# Patient Record
Sex: Male | Born: 1945 | Race: White | Hispanic: No | Marital: Married | Smoking: Former smoker
Health system: Southern US, Community
[De-identification: ages and names within clinical notes are randomized; demographics above are authoritative.]

## PROBLEM LIST (undated history)

## (undated) DIAGNOSIS — N138 Other obstructive and reflux uropathy: Secondary | ICD-10-CM

## (undated) DIAGNOSIS — R311 Benign essential microscopic hematuria: Secondary | ICD-10-CM

## (undated) DIAGNOSIS — M1990 Unspecified osteoarthritis, unspecified site: Secondary | ICD-10-CM

## (undated) DIAGNOSIS — J449 Chronic obstructive pulmonary disease, unspecified: Secondary | ICD-10-CM

## (undated) DIAGNOSIS — J4489 Other specified chronic obstructive pulmonary disease: Secondary | ICD-10-CM

## (undated) DIAGNOSIS — E663 Overweight: Secondary | ICD-10-CM

## (undated) DIAGNOSIS — R6883 Chills (without fever): Secondary | ICD-10-CM

## (undated) DIAGNOSIS — I10 Essential (primary) hypertension: Secondary | ICD-10-CM

## (undated) DIAGNOSIS — K219 Gastro-esophageal reflux disease without esophagitis: Secondary | ICD-10-CM

## (undated) DIAGNOSIS — N401 Enlarged prostate with lower urinary tract symptoms: Secondary | ICD-10-CM

## (undated) HISTORY — PX: OTHER SURGICAL HISTORY: SHX169

## (undated) HISTORY — DX: Unspecified osteoarthritis, unspecified site: M19.90

## (undated) HISTORY — DX: Essential (primary) hypertension: I10

## (undated) HISTORY — PX: RHIZOTOMY: SHX2355

## (undated) HISTORY — DX: Benign prostatic hyperplasia with lower urinary tract symptoms: N40.1

## (undated) HISTORY — DX: Overweight: E66.3

## (undated) HISTORY — DX: Benign prostatic hyperplasia with lower urinary tract symptoms: N13.8

## (undated) HISTORY — PX: HIATAL HERNIA REPAIR: SHX195

## (undated) HISTORY — DX: Chronic obstructive pulmonary disease, unspecified: J44.9

## (undated) HISTORY — DX: Chills (without fever): R68.83

## (undated) HISTORY — DX: Gastro-esophageal reflux disease without esophagitis: K21.9

## (undated) HISTORY — PX: CHOLECYSTECTOMY: SHX55

## (undated) HISTORY — DX: Benign essential microscopic hematuria: R31.1

## (undated) HISTORY — PX: URETHRAL STRICTURE DILATATION: SHX477

## (undated) HISTORY — DX: Other specified chronic obstructive pulmonary disease: J44.89

---

## 2006-09-23 ENCOUNTER — Other Ambulatory Visit: Payer: Self-pay

## 2006-09-23 ENCOUNTER — Emergency Department: Payer: Self-pay | Admitting: Emergency Medicine

## 2006-09-29 ENCOUNTER — Ambulatory Visit: Payer: Self-pay | Admitting: Unknown Physician Specialty

## 2007-01-10 ENCOUNTER — Inpatient Hospital Stay (HOSPITAL_COMMUNITY): Admission: EM | Admit: 2007-01-10 | Discharge: 2007-01-14 | Payer: Self-pay | Admitting: Emergency Medicine

## 2007-02-23 ENCOUNTER — Ambulatory Visit: Admission: RE | Admit: 2007-02-23 | Discharge: 2007-02-23 | Payer: Self-pay | Admitting: Neurosurgery

## 2010-11-27 ENCOUNTER — Other Ambulatory Visit (HOSPITAL_COMMUNITY): Payer: Self-pay

## 2010-12-03 ENCOUNTER — Inpatient Hospital Stay (HOSPITAL_COMMUNITY): Payer: 59

## 2010-12-03 ENCOUNTER — Inpatient Hospital Stay (HOSPITAL_COMMUNITY)
Admission: RE | Admit: 2010-12-03 | Discharge: 2010-12-06 | DRG: 026 | Disposition: A | Discharge status: Home / Self Care | Payer: 59 | Source: Ambulatory Visit | Attending: Neurosurgery | Admitting: Neurosurgery

## 2010-12-03 DIAGNOSIS — G500 Trigeminal neuralgia: Principal | ICD-10-CM | POA: Diagnosis present

## 2010-12-03 DIAGNOSIS — J4489 Other specified chronic obstructive pulmonary disease: Secondary | ICD-10-CM | POA: Diagnosis present

## 2010-12-03 DIAGNOSIS — N390 Urinary tract infection, site not specified: Secondary | ICD-10-CM | POA: Diagnosis present

## 2010-12-03 DIAGNOSIS — J449 Chronic obstructive pulmonary disease, unspecified: Secondary | ICD-10-CM | POA: Diagnosis present

## 2010-12-03 DIAGNOSIS — I10 Essential (primary) hypertension: Secondary | ICD-10-CM | POA: Diagnosis present

## 2010-12-03 LAB — BASIC METABOLIC PANEL
BUN: 15 mg/dL (ref 6–23)
CO2: 29 mEq/L (ref 19–32)
Calcium: 9.4 mg/dL (ref 8.4–10.5)
Chloride: 104 mEq/L (ref 96–112)
Creatinine, Ser: 0.99 mg/dL (ref 0.4–1.5)
GFR calc Af Amer: >60 mL/min (ref >60)
GFR calc non Af Amer: >60 mL/min (ref >60)
Glucose, Bld: 100 mg/dL — ABNORMAL HIGH (ref 70–99)
Potassium: 5.1 mEq/L (ref 3.5–5.1)
Sodium: 138 mEq/L (ref 135–145)

## 2010-12-03 LAB — URINE MICROSCOPIC-ADD ON

## 2010-12-03 LAB — TYPE AND SCREEN
ABO/RH(D): O POS
Antibody Identification: REACTIVE
Antibody Screen: POSITIVE
DAT, IgG: NEGATIVE

## 2010-12-03 LAB — URINALYSIS, ROUTINE W REFLEX MICROSCOPIC
Bilirubin Urine: NEGATIVE
Glucose, UA: NEGATIVE mg/dL
Ketones, ur: NEGATIVE mg/dL
Nitrite: NEGATIVE
Protein, ur: NEGATIVE mg/dL
Specific Gravity, Urine: 1.015 (ref 1.005–1.030)
Urobilinogen, UA: 0.2 mg/dL (ref 0.0–1.0)
pH: >9 — ABNORMAL HIGH (ref 5.0–8.0)

## 2010-12-03 LAB — CBC
HCT: 42 % (ref 39.0–52.0)
Hemoglobin: 14.6 g/dL (ref 13.0–17.0)
MCH: 29.6 pg (ref 26.0–34.0)
MCHC: 34.8 g/dL (ref 30.0–36.0)
MCV: 85 fL (ref 78.0–100.0)
Platelets: 239 10*3/uL (ref 150–400)
RBC: 4.94 MIL/uL (ref 4.22–5.81)
RDW: 14 % (ref 11.5–15.5)
WBC: 6.1 10*3/uL (ref 4.0–10.5)

## 2010-12-03 LAB — SURGICAL PCR SCREEN
MRSA, PCR: NEGATIVE
Staphylococcus aureus: NEGATIVE

## 2010-12-04 LAB — URINE CULTURE
Colony Count: NO GROWTH
Culture  Setup Time: 201204241440
Culture: NO GROWTH
Special Requests: POSITIVE

## 2010-12-04 LAB — GLUCOSE, CAPILLARY: Glucose-Capillary: 103 mg/dL — ABNORMAL HIGH (ref 70–99)

## 2010-12-04 NOTE — Consult Note (Signed)
  NAME:  Keith Craig, Keith Craig NO.:  [id number]  MEDICAL RECORD NO.:  [id number]           PATIENT TYPE:  I  LOCATION:  3109                         FACILITY:  [facility]  PHYSICIAN:  Bertram Millard. Cabell Lazenby, M.D.DATE OF BIRTH:  18-Oct-1945  DATE OF CONSULTATION:  12/03/2010 DATE OF DISCHARGE:                                CONSULTATION   REASON FOR CONSULTATION:  Difficult catheterization.  REQUESTING PHYSICIAN:  Danae Orleans. Venetia Maxon, MD.  BRIEF HISTORY:  A 65 year old male, who has undergone a craniotomy for treatment of trigeminal neuralgia.  The patient was anesthetized for the procedure, and before the procedure began, attempted bladder catheterization was performed.  First, a 16-French Foley was used, then a 16-French coude.  Catheterization was unsuccessful, and at the time of consultation, the nurses noticed some blood at the meatus.  Urologic consultation is requested.  There is apparently a history of a large prostate, but no prior urologic consultation.  There is no history of urologic surgery.  Past medical history, review of systems is unobtainable at this point, as the patient is anesthetized.  Exam revealed the patient to have a flat abdomen without any suprapubic mass.  Phallus was circumcised.  There was some blood at the meatus. Scrotal skin, testicles, cords, and epididymal structures were normal. There was normal anal sphincter tone.  Gland was 2+ without nodularity or tenderness.  There are no rectal masses.  I attempted an 18-French coude tip catheter placement after sterile prep and drape.  This was unsuccessful.  I then performed flexible cystoscopy.  Unfortunately, there was an obliterated proximal urethra. I was unable to find any normal lumen of the urethra.  I saw no evidence of stricture disease more distally.  There was some bleeding, but not heavy.  Cystoscopy was stopped at this point.  The bladder was nonpalpable. Currently, I feel that  worthwhile to not place a suprapubic tube, as the patient did not have a palpable bladder.  As he had no severe voiding problems beforehand (I talked with the patient's wife), I will leave the bladder undrained.  We will give him a voiding trial postoperatively.  If there is difficulty voiding, we will have to place a suprapubic tube temporarily.  If there is adequate voiding, I would need to see the patient in follow- up in approximately 2-3 weeks, after he is adequately healed up from his craniotomy.  The patient was covered with Ancef for both his craniotomy and his urethral instrumentation.     Bertram Millard. Retta Diones, M.D.     SMD/MEDQ  D:  12/03/2010  T:  12/03/2010  Job:  [id number]  Electronically Signed by Marcine Matar M.D. on 12/04/2010 05:58:41 AM

## 2010-12-04 NOTE — Op Note (Signed)
NAME:  Keith Craig, Keith Craig NO.:  [id number]  MEDICAL RECORD NO.:  [id number]           PATIENT TYPE:  I  LOCATION:  2899                         FACILITY:  [facility]  PHYSICIAN:  Danae Orleans. Venetia Maxon, M.D.  DATE OF BIRTH:  27-Jan-1946  DATE OF PROCEDURE:  12/03/2010 DATE OF DISCHARGE:                              OPERATIVE REPORT   PREOPERATIVE DIAGNOSIS:  Left V1 and V2 trigeminal neuralgia.  POSTOPERATIVE DIAGNOSIS:  Left V1 and V2 trigeminal neuralgia.  PROCEDURE:  Left trigeminal nerve microvascular decompression with cranioplasty, microdissection.  SURGEON:  Danae Orleans. Venetia Maxon, MD  ASSISTANTS:  Hewitt Shorts, MD and Georgiann Cocker, RN  ANESTHESIA:  General endotracheal anesthesia.  ESTIMATED BLOOD LOSS:  Minimal.  COMPLICATIONS:  None.  DISPOSITION:  Recovery.  INDICATIONS:  Keith Craig is a 65 year old man with intractable left facial pain in the V1 and V2 distribution refractory to medications.  It was elected to take him to surgery for left microvascular decompression. He had already undergone a radiofrequency ablation with temporary relief but then with recurrence of facial pain.  PROCEDURE:  [name] was brought to the operating room.  Following satisfactory and uncomplicated induction of general endotracheal anesthesia plus intravenous lines, effort was made to place a Foley catheter.  Initially, we used a 16-French regular catheter, then Coude tip catheter, then we consulted Dr. Patsi Sears to place a Foley catheter, he ended up doing a cystoscopy and was not able to see a lumen felt that the patient had a stricture and recommended not to place a Foley catheter.  We then placed the patient in three-pin head fixation turned him in a right lateral decubitus position with axillary roll and padding soft tissue and bony prominences appropriately.  His left postauricular scalp was shaved, then prepped and draped in the usual sterile fashion.   Area of planned incision was infiltrated with local lidocaine.  Incision was then made and carried to the calvarium using cerebellar retractor.  The posterior fossa bone was exposed.  A high- speed drill was used to produce a suboccipital craniectomy.  Emissary veins were waxed and mastoid air cells were also waxed.  The dura was exposed and then the remaining bone was removed with a Kerrison rongeur. The transverse sinus was identified as was the sigmoid sinus and the junction of the sinuses were exposed.  The dura was incised.  There was significant removal of CSF which led to relaxation of the posterior fossa and an Apfelbaum retractor was used with a quarter-inch retractor blade.  The superior petrosal sinus was visualized.  A microscope was then used for the remainder of the surgery.  The superior petrosal vein was then cauterized with bipolar electrocautery and cut.  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  There were several vascular structures which were indenting the trigeminal nerve including an artery which was along the undersurface of the nerve with a branch and the branch point was directly compressing the nerve with the knuckle of artery that was directly into the underside of the nerve.  Two Teflon pledgets were then  fashioned, 1 pledget was placed underlying the nerve overlying the artery along the brainstem and then a second pledget was placed more distally along the nerve where the nerve entered Meckel cave, there was an additional arterial branch which appeared to be indenting the undersurface of the nerve.  At this point, the nerve was felt to be well- decompressed.  Hemostasis was assured.  The wounds were irrigated.  The retractor was removed.  The dura was then reapproximated loosely with Nurolon stitches and a piece of DuraGen was placed overlying the dural defect.  A cranioplasty was then performed with  methylmethacrylate to fill the suboccipital cranial defect.  The muscular layer was then reapproximated with 0 Vicryl sutures.  The fascia was reapproximated with 0 Vicryl sutures.  Subcutaneous tissues were reapproximated with 2- 0 Vicryl interrupted inverted sutures and the skin edges were approximated with running 3-0 nylon lock stitch.  The wound was dressed with bacitracin, Telfa gauze, and OpSite.  The patient was then extubated in the operating room.  He was taken out of three-pin head fixation.  He was turned to the supine position and taken to recovery and tolerated the procedure well.  Counts were correct at the end of the case.     Danae Orleans. Venetia Maxon, M.D.     JDS/MEDQ  D:  12/03/2010  T:  12/03/2010  Job:  [id number]  Electronically Signed by Maeola Harman M.D. on 12/04/2010 09:22:01 AM

## 2010-12-24 NOTE — Discharge Summary (Signed)
NAME:  Keith Craig, Keith Craig NO.:  [id number]   MEDICAL RECORD NO.:  [id number]          PATIENT TYPE:  INP   LOCATION:  3005                         FACILITY:  [facility]   PHYSICIAN:  Marlan Palau, M.D.  DATE OF BIRTH:  09/02/1945   DATE OF ADMISSION:  01/10/2007  DATE OF DISCHARGE:  01/14/2007                               DISCHARGE SUMMARY   ADMISSION DIAGNOSES:  1. Trigeminal neuralgia with severe incapacitating pain.  2. Hypertension.  3. History of asthma with chronic obstructive pulmonary disease.   DISCHARGE DIAGNOSES:  1. Incapacitating left trigeminal neuralgia pain.  2. Hypertension.  3. Asthma.  4. Gastroesophageal reflux disease.   PROCEDURES DURING THIS ADMISSION:  1. MRI of the brain.  2. MRI angiogram of the intracranial and extracranial vessels.  3. Percutaneous rhizotomy of the left V2 area.   HISTORY OF PRESENT ILLNESS:  Keith Craig is a 65 year old right-  handed white male born March 17, 1946 with history of left-sided  trigeminal neuralgia affecting the V1 and V2 areas.  The patient has had  a longstanding history of pain dating back to 1999.  The patient has  been seen by Dr. Vonita Moss in [facility] Dr. Donette Larry at [facility].  The patient has been followed for several years by Dr. Trey Sailors and has been treated with carbamazepine and Neurontin.  Over the  last 2 weeks, the patient has had significant exacerbation of pain.  The  patient has been unable to chew, swallow or eat.  The patient has not  taken any p.o. food and fluids since Jan 09, 2007.  He comes into the  emergency room on January 10, 2007 for pain management.  The patient is  unable to speak.  Wife was concerned about a stroke, but upon further  evaluation, it was obvious that the patient's inability speak was  related to severe pain.  The patient was admitted for evaluation and  management of pain.   PAST MEDICAL HISTORY:  1. History of trigeminal neuralgia on  the left.  2. Obesity.  3. Hypertension.  4. Gastroesophageal reflux disease.  5. Asthma with COPD.   MEDICATIONS:  1. Carbamazepine 200 mg three times day.  2. Neurontin 300 mg three times a day.  3. Clonidine 0.2 mg b.i.d.  4. Diovan 160 mg daily.  5. Protonix 40 mg b.i.d.  6. Percocet if needed.  7. Serevent inhaler 50 mcg if needed.  8. Flovent inhaler b.i.d.  9. Albuterol inhaler p.r.n.   ALLERGIES:  The patient has no known allergies.   Does not smoke or drink.   SOCIAL HISTORY/FAMILY HISTORY/REVIEW OF SYSTEMS/PHYSICAL EXAMINATION:  Please refer to history and physical dictation.   LABORATORY VALUES:  Sodium of 134, potassium 4.4, chloride of 101.  CO2  of 26, glucose of 151, BUN of 25, creatinine of 0.91, calcium of 8.7.  Dilantin of 4.1.  White count of 5.1, hemoglobin 15.4, hematocrit 45.0,  MCV of 87.0, platelets of 224, INR 0.9.  Tegretol level on admission was  5.3, creatinine 1.0.   Electrocardiogram revealed normal sinus rhythm, normal EKG,  heart rate  of 66.  MRI scan of the brain was unremarkable.  Chronic sinusitis was  noted.  On MRI angiogram, intracranial and extracranial vessels were  normal.  CT of the head was unremarkable.  Chest x-ray shows no acute  findings.  A moderate size hiatal hernia noted.   HOSPITAL COURSE:  This patient was admitted to [facility] with  severe trigeminal neuralgia pain.  This patient has not been able to eat  for at least 24 hours prior to admission.  The patient was placed on IV  Dilantin, as she was unable to take oral medications, and was put on  Solu-Medrol 250 mg IV q.6h.  The patient had not improved much in the  first day and a half of the hospitalization on this regimen.  The  patient was seen by Dr. Trey Sailors, and plans for a V2 rhizotomy procedure  was planned.  This was done early in the morning on January 13, 2007.  The  patient immediately gained significant benefit with pain improvement in  the V2  distribution and restarted regaining ability to talk and eat and  swallow.  The patient has been able to take in p.o. food and fluids over  the last 24 hours, and plans are to discharge this patient to home today  with follow-up with Dr. Channing Mutters.   DISCHARGE MEDICATIONS:  1. Clonidine 0.2 mg twice daily.  2. Diovan 160 mg daily.  3. Neurontin 600 three times daily.  4. Carbamazepine 200 mg three a day.  5. Protonix 40 mg daily.  6. Flovent inhaler 2 puffs twice daily.  7. Albuterol inhaler if needed.  8. Serevent discus 50 mcg 1 puff twice daily if needed.  9. The patient has oxycodone at home that he can take if needed.  10.Fentanyl patch was used during the hospitalization with a 50 mcg      patch.  This will be discontinued upon      discharge.  11.The patient will be on prednisone taper 10 mg 12-day pack.   FLUIDS REPLACED:  Again, will follow up with Dr. Trey Sailors.      Marlan Palau, M.D.  Electronically Signed     CKW/MEDQ  D:  01/14/2007  T:  01/14/2007  Job:  [id number]   cc:   Payton Doughty, M.D.  [facility]

## 2010-12-24 NOTE — Op Note (Signed)
NAME:  SOLLIE, VULTAGGIO NO.:  [id number]   MEDICAL RECORD NO.:  [id number]          PATIENT TYPE:  INP   LOCATION:  3005                         FACILITY:  [facility]   PHYSICIAN:  Payton Doughty, M.D.      DATE OF BIRTH:  18-Apr-1946   DATE OF PROCEDURE:  01/12/2007  DATE OF DISCHARGE:                               OPERATIVE REPORT   PREOPERATIVE DIAGNOSIS:  Left V1 and V2 trigeminal neuralgia.   POSTOPERATIVE DIAGNOSIS:  Left V1 and V2 trigeminal neuralgia.   OPERATIVE PROCEDURE:  Left V2 percutaneous rhizotomy.   SURGEON:  Payton Doughty, M.D.   ANESTHESIA:  Local with sedation.   COMPLICATIONS:  None.   This is a 65 year old gentleman with left trigeminal neuralgia in part  V1 and part V2, taken to the operating room, placed on the operating  table.  Fluoro was positioned to demonstrate the foramen ovale on the  left side.  After 1% lidocaine injection into the cheek, disposable  needle was advanced into the foramen ovale in its medial aspect.  It was  advanced up about 5 mm past the clivus and during stimulation, we  obtained stimulation below his left eye but not above it.  It is felt  that this was the furthest the needle could be advanced safely without  creating corneal anesthesia.  The 60-degree 62nd lesion was then created  and the needle withdrawn without difficulty.  The patient returned to  the recovery room in good condition.           ______________________________  Payton Doughty, M.D.     MWR/MEDQ  D:  01/13/2007  T:  01/13/2007  Job:  [id number]

## 2010-12-24 NOTE — Discharge Summary (Signed)
  NAME:  Keith Craig, Keith Craig NO.:  [id number]  MEDICAL RECORD NO.:  [id number]           PATIENT TYPE:  I  LOCATION:  3019                         FACILITY:  [facility]  PHYSICIAN:  Danae Orleans. Venetia Maxon, M.D.  DATE OF BIRTH:  04-14-1946  DATE OF ADMISSION:  12/03/2010 DATE OF DISCHARGE:  12/06/2010                              DISCHARGE SUMMARY   REASON FOR ADMISSION:  Trigeminal neuralgia.  ADDITIONAL DIAGNOSES:  Urinary retention and urinary stricture.  HISTORY OF ILLNESS AND HOSPITAL COURSE:  Rodrigus Kilker is a 65 year old man with intractable left V1 and V2 distribution trigeminal neuralgia refractory to medications including carbamazepine and Neurontin.  He was admitted and underwent same day's procedure basis a left trigeminal nerve microvascular decompression.  The surgery was the only surgically issue related to placement of Foley catheter which was impossible to do. We called Dr. Marcine Matar who tried to place a Foley catheter, could not and then did a cystoscopy and found that the patient had a significant urinary stricture and was elected not to place a catheter. The patient was subsequently able to urinate on his own.  He did well with the surgical decompression and postoperatively was gradually mobilized, was observed in the ICU with arterial line blood pressure monitoring without any other problems.  He was doing well.  On December 06, 2010, was discharged home in stable and satisfactory condition having tolerated the operation and hospitalization well.  The only additional surgical issue related to some skew deviation related to his left eye but his facial pain was entirely resolved.  Hearing and facial motor were intact.  He also had on December 04, 2010, an episode of hypotension and bradycardia after breath-holding following an Advair dose and he was observed for an additional day in the ICU and did well with that without any other blood pressure issues.   He was discharged home on December 06, 2010, having tolerated his operation and hospitalization well, was discharged home on preoperative medications of milk of magnesia, Tylenol, oxycodone, Advair Diskus, gabapentin 600 mg four times daily, carbamazepine 200 mg three times daily, Protonix 40 mg daily, clonidine 0.2 mg twice daily and Diovan 160 mg daily with hydrocodone for pain. He was instructed to follow up in the office 10 days postoperatively for suture removal.     Danae Orleans. Venetia Maxon, M.D.     JDS/MEDQ  D:  12/17/2010  T:  12/17/2010  Job:  [id number]  Electronically Signed by Maeola Harman M.D. on 12/24/2010 07:43:53 AM

## 2010-12-24 NOTE — H&P (Signed)
NAME:  Keith Craig NO.:  [id number]   MEDICAL RECORD NO.:  [id number]          PATIENT TYPE:  INP   LOCATION:  3005                         FACILITY:  [facility]   PHYSICIAN:  Marlan Palau, M.D.  DATE OF BIRTH:  1946-02-13   DATE OF ADMISSION:  01/10/2007  DATE OF DISCHARGE:                              HISTORY & PHYSICAL   HISTORY OF PRESENT ILLNESS:  Keith Craig is a 65 year old right-  handed white male born 1946/04/13 with a history of trigeminal  neuralgia on the left side followed by Dr. Channing Mutters for a number of years.  The patient claims he first began having problems in 1999.  The patient  had occasional twinges in the left face prior to that.  The patient  initially was seen by Dr. Roseanne Reno, a neurology from the [facility] area,  but eventually went to see Dr. Donette Larry.  Due to a change in  insurance, the patient has been followed by Dr. Channing Mutters for several years  for the same problem.  Patient has been treated with Tegretol and  carbamazepine and Neurontin but over the last 2 weeks has had a severe  exacerbation of his pain.  In the past, the patient claims that steroids  have helped his pain.  Patient has gotten to the point where he has been  unable to chew, swallow, has not eaten anything or had much to drink  since 5 p.m. on Jan 09, 2007.  The patient has been unable to speak  because of this severe pain.  The wife is concerned about a stroke  brought to the hospital for further evaluation.  MRI scan done through  the ER is completely normal by my reading.  MR angiogram of intracranial  and extracranial vessels were normal.  Neurology was called after Dr.  Temple Pacini partners declined evaluation.  The patient is being admitted for  evaluation and management of the trigeminal neuralgia.   PAST MEDICAL HISTORY:  1. Significant for one history of trigeminal neuralgia on the left.  2. Obesity.  3. Hypertension.  4. Gastroesophageal reflux disease.  5. Asthma with COPD.   CURRENT MEDICATIONS:  1. Include carbamazepine 200 mg t.i.d.  2. Neurontin 300 mg t.i.d.  3. Clonidine 0.2 mg b.i.d.  4. Diovan 160 mg daily.  5. Protonix 40 mg b.i.d.  6. Percocet if needed.  7. Serevent inhaler 50 mcg if needed.  8. Flovent inhaler b.i.d.  9. Albuterol inhaler p.r.n.   Patient has no known allergies.  Does not smoke or drink.   SOCIAL HISTORY:  The patient is married, lives in the [place] area, is currently unemployed and looking for a job.  Patient  has two daughters who are alive and well.   FAMILY MEDICAL HISTORY:  Noted that father died with rheumatoid  arthritis.  Mother died with stroke, hypertension.  Father's side of the  family has extensive history of stroke.  The patient has a brother with  hypertension.   REVIEW OF SYSTEMS:  Notable for no recent fevers or chills.  The patient  does note  a mild headache with the trigeminal neuralgia when it gets  severe.  Does have some slight neck pain.  Has some low back pain with  weakness in the legs related to the back pain.  Has no chest pains,  shortness of breath other than the bouts of asthma.  Denies any nausea,  vomiting.  Does have some recent problems with diarrhea.  The patient  notes no focal numbness, weakness on arms or legs.   PHYSICAL EXAMINATION:  VITALS:  Blood pressure 150/91, heart rate 72,  respiratory rate 14, temperature febrile.  General:  This patient is a minimally to moderately obese white male who  is alert and cooperative at the time of examination.  HEENT:  Examination of head is atraumatic.  Eyes:  Pupils equal, round  and react to light.  Disks are flat bilaterally.  NECK:  Supple.  No carotid bruits noted.  RESPIRATORY:  Examination is clear.  CARDIOVASCULAR:  Examination reveals a regular rate and rhythm.  No  obvious murmurs or rubs are noted.  ABDOMEN:  Reveals positive bowel sounds.  No organomegaly or tenderness  noted.   EXTREMITIES:  Reveal trace edema at the ankles.  NEUROLOGIC:  Examination of cranial nerves as above.  Facial symmetry is  present.  The patient has difficulty opening his mouth and speaking due  to severe pain.  Extraocular movements are full.  Visual fields are  full.  No aphasia noted.  Motor testing reveals 5/5 strength in all  fours.  Good symmetric motor tone is noted throughout.  The patient has  some discomfort with elevating the right leg, has some giveaway weakness  with this.  The patient has good pinprick to soft touch and vibratory  sensation throughout.  The patient has good finger-nose-finger and toe-  to-finger bilaterally.  Gait was not tested.  Deep tendon reflexes  symmetric and normal.  Toes are downgoing bilaterally.  No drift is seen  in the upper extremities.   MRI scan of the brain is as above.   LABORATORY VALUES:  Notable for white count of 5.1, hemoglobin of 16.3,  hematocrit 48, MCV of 7, platelets of 224.  INR 0.9, sodium 138,  potassium 4.5, chloride of 106, glucose of 88, BUN of 21 and creatinine  1.   IMPRESSION:  1. Severe right trigeminal neuralgia.  2. Hypertension.  3. Gastroesophageal reflux disease.   This patient has pain to the point where he is not able to eat and  drink.  Has not had any p.o. intake over the last 24 hours.  The patient  will be admitted for pain control for IV fluid hydration.   PLAN:  1. Admission to [facility].  2. IV Solu-Medrol.  3. Morphine for pain.  4. Check carbamazepine levels.  5. Increase Neurontin to 600 mg t.i.d.  6. Clear liquid diet.  We will plan discharge once the patient's pain      level is down enough that he can eat or drink.      Marlan Palau, M.D.  Electronically Signed     CKW/MEDQ  D:  01/10/2007  T:  01/10/2007  Job:  [id number]   cc:   [facility]

## 2010-12-24 NOTE — Consult Note (Signed)
NAME:  Keith Craig, METALLO NO.:  [id number]   MEDICAL RECORD NO.:  [id number]          PATIENT TYPE:  INP   LOCATION:  3005                         FACILITY:  [facility]   PHYSICIAN:  Payton Doughty, M.D.      DATE OF BIRTH:  01/31/1946   DATE OF CONSULTATION:  01/11/2007  DATE OF DISCHARGE:                                 CONSULTATION   NEUROSURGERY CONSULT   PLACE OF CONSULT:  [address].   I was asked to see this 65 year old right-handed white gentleman who is  well known to me.  He has trigeminal neuralgia.  It is in the left side  in the V1, V2 distribution, set off by touching his tongue.  He notes  that several months ago he was treated with steroids for another problem  in the same __________ of the  face.  Dr. Anne Hahn has started him on  methylprednisolone in an effort to help this.  Mr. Mulford has been  unable to eat or drink for about 3 days.   On exam, his cranial nerves are intact save for V1, V2 tic.  It is  centered just about at the corner of his eye.  It gets down into his  malar eminence and up onto his forehead.  Remainder of his exam is  intact.   CLINICAL IMPRESSION:  Trigeminal neuralgia.  The plan is for a  percutaneous rhizotomy tomorrow.  We are going to add some viscous  lidocaine at his bedside to try and work that into his mouth.           ______________________________  Payton Doughty, M.D.     MWR/MEDQ  D:  01/11/2007  T:  01/11/2007  Job:  [id number]

## 2010-12-24 NOTE — H&P (Signed)
NAME:  RITVIK, MCZEAL NO.:  [id number]   MEDICAL RECORD NO.:  [id number]          PATIENT TYPE:  OBV   LOCATION:  3172                         FACILITY:  [facility]   PHYSICIAN:  Payton Doughty, M.D.      DATE OF BIRTH:  September 18, 1945   DATE OF ADMISSION:  02/23/2007  DATE OF DISCHARGE:                              HISTORY & PHYSICAL   OUTPATIENT PROCEDURE:   SERVICE:  Neurosurgery.   ADMITTING DIAGNOSIS:  Left V1 trigeminal neuralgia.   This is a 65 year old right-handed white gentleman who had percutaneous  rhizotomy for a right V1 tic done a month ago.  He did well.He has  had  a small recurrence in the distribution of V1, right around the corner of  his eye when he touches the corner of his mustache.  He is back up on  his Tegretol and still having attacks, so he is going to come in for a  outpatient percutaneous rhizotomy.  Health remains good.  Neurologic  exam is intact.   CLINICAL IMPRESSION:  Persistent trigeminal neuralgia.   PLAN:  Percutaneous rhizotomy as an outpatient.           ______________________________  Payton Doughty, M.D.     MWR/MEDQ  D:  02/23/2007  T:  02/24/2007  Job:  [id number]

## 2011-02-13 ENCOUNTER — Observation Stay: Payer: Self-pay | Admitting: Internal Medicine

## 2011-02-18 LAB — PATHOLOGY REPORT

## 2011-04-15 ENCOUNTER — Ambulatory Visit: Payer: Self-pay | Admitting: Gastroenterology

## 2011-04-18 LAB — PATHOLOGY REPORT

## 2011-05-26 LAB — DIFFERENTIAL
Basophils Absolute: 0.1
Basophils Relative: 1
Eosinophils Absolute: 0.3
Eosinophils Relative: 6 — ABNORMAL HIGH
Lymphocytes Relative: 30
Lymphs Abs: 1.3
Monocytes Absolute: 0.5
Monocytes Relative: 11
Neutro Abs: 2.1
Neutrophils Relative %: 51

## 2011-05-26 LAB — URINALYSIS, ROUTINE W REFLEX MICROSCOPIC
Bilirubin Urine: NEGATIVE
Glucose, UA: NEGATIVE
Hgb urine dipstick: NEGATIVE
Ketones, ur: NEGATIVE
Nitrite: NEGATIVE
Protein, ur: 100 — AB
Specific Gravity, Urine: 1.016
Urobilinogen, UA: 0.2
pH: 7.5

## 2011-05-26 LAB — COMPREHENSIVE METABOLIC PANEL
ALT: 17
AST: 15
Albumin: 3.6
Alkaline Phosphatase: 59
BUN: 14
CO2: 28
Calcium: 9.1
Chloride: 108
Creatinine, Ser: 0.86
GFR calc Af Amer: >60
GFR calc non Af Amer: >60
Glucose, Bld: 93
Potassium: 4.7
Sodium: 143
Total Bilirubin: 0.6
Total Protein: 6.4

## 2011-05-26 LAB — URINE MICROSCOPIC-ADD ON

## 2011-05-26 LAB — TYPE AND SCREEN
ABO/RH(D): O POS
Antibody Identification: REACTIVE
Antibody Screen: POSITIVE
DAT, IgG: NEGATIVE

## 2011-05-26 LAB — CBC
HCT: 42.9
Hemoglobin: 14.7
MCHC: 34.2
MCV: 86.6
Platelets: 265
RBC: 4.95
RDW: 13.2
WBC: 4.2

## 2011-05-26 LAB — APTT: aPTT: 27

## 2011-05-26 LAB — PROTIME-INR
INR: 1
Prothrombin Time: 13

## 2011-05-29 LAB — I-STAT 8, (EC8 V) (CONVERTED LAB)
Acid-Base Excess: 1
BUN: 21
Bicarbonate: 26.8 — ABNORMAL HIGH
Chloride: 106
Glucose, Bld: 88
HCT: 48
Hemoglobin: 16.3
Operator id: 270111
Potassium: 4.5
Sodium: 138
TCO2: 28
pCO2, Ven: 47.5
pH, Ven: 7.36 — ABNORMAL HIGH

## 2011-05-29 LAB — DIFFERENTIAL
Basophils Absolute: 0.1
Basophils Relative: 1
Eosinophils Absolute: 0.3
Eosinophils Relative: 6 — ABNORMAL HIGH
Lymphocytes Relative: 28
Lymphs Abs: 1.4
Monocytes Absolute: 0.5
Monocytes Relative: 10
Neutro Abs: 2.8
Neutrophils Relative %: 55

## 2011-05-29 LAB — BASIC METABOLIC PANEL
BUN: 17
BUN: 25 — ABNORMAL HIGH
CO2: 26
CO2: 26
Calcium: 8.7
Calcium: 9.1
Chloride: 101
Chloride: 105
Creatinine, Ser: 0.91
Creatinine, Ser: 0.92
GFR calc Af Amer: >60
GFR calc Af Amer: >60
GFR calc non Af Amer: >60
GFR calc non Af Amer: >60
Glucose, Bld: 135 — ABNORMAL HIGH
Glucose, Bld: 151 — ABNORMAL HIGH
Potassium: 4.2
Potassium: 4.4
Sodium: 134 — ABNORMAL LOW
Sodium: 137

## 2011-05-29 LAB — PROTIME-INR
INR: 0.9
Prothrombin Time: 12.7

## 2011-05-29 LAB — CBC
HCT: 45
Hemoglobin: 15.4
MCHC: 34.2
MCV: 87
Platelets: 224
RBC: 5.17
RDW: 13.6
WBC: 5.1

## 2011-05-29 LAB — CARBAMAZEPINE LEVEL, TOTAL: Carbamazepine Lvl: 5.3

## 2011-05-29 LAB — POCT I-STAT CREATININE
Creatinine, Ser: 1
Operator id: 270111

## 2011-05-29 LAB — PHENYTOIN LEVEL, TOTAL: Phenytoin Lvl: 4.1 — ABNORMAL LOW

## 2011-06-02 ENCOUNTER — Ambulatory Visit: Payer: Self-pay | Admitting: Surgery

## 2011-07-10 ENCOUNTER — Ambulatory Visit: Payer: Self-pay | Admitting: Surgery

## 2011-07-16 ENCOUNTER — Ambulatory Visit: Payer: Self-pay | Admitting: Unknown Physician Specialty

## 2011-07-17 ENCOUNTER — Ambulatory Visit: Payer: Self-pay | Admitting: Surgery

## 2011-08-02 ENCOUNTER — Emergency Department: Payer: Self-pay | Admitting: Emergency Medicine

## 2012-02-05 ENCOUNTER — Ambulatory Visit: Payer: Self-pay | Admitting: Unknown Physician Specialty

## 2012-02-05 LAB — CREATININE, SERUM
Creatinine: 1.1 mg/dL (ref 0.60–1.30)
EGFR (African American): >60
EGFR (Non-African Amer.): >60

## 2012-08-09 ENCOUNTER — Ambulatory Visit: Payer: Self-pay | Admitting: Specialist

## 2013-02-07 ENCOUNTER — Ambulatory Visit: Payer: Self-pay | Admitting: Specialist

## 2013-05-28 ENCOUNTER — Emergency Department: Payer: Self-pay | Admitting: Emergency Medicine

## 2013-05-28 LAB — CBC WITH DIFFERENTIAL/PLATELET
Basophil #: 0.1 10*3/uL (ref 0.0–0.1)
Basophil %: 0.6 %
Eosinophil #: 0.3 10*3/uL (ref 0.0–0.7)
Eosinophil %: 2.3 %
HCT: 39.5 % — ABNORMAL LOW (ref 40.0–52.0)
HGB: 13.7 g/dL (ref 13.0–18.0)
Lymphocyte #: 0.8 10*3/uL — ABNORMAL LOW (ref 1.0–3.6)
Lymphocyte %: 5.3 %
MCH: 29.8 pg (ref 26.0–34.0)
MCHC: 34.7 g/dL (ref 32.0–36.0)
MCV: 86 fL (ref 80–100)
Monocyte #: 0.8 x10 3/mm (ref 0.2–1.0)
Monocyte %: 5.7 %
Neutrophil #: 12.5 10*3/uL — ABNORMAL HIGH (ref 1.4–6.5)
Neutrophil %: 86.1 %
Platelet: 278 10*3/uL (ref 150–440)
RBC: 4.6 10*6/uL (ref 4.40–5.90)
RDW: 13.5 % (ref 11.5–14.5)
WBC: 14.5 10*3/uL — ABNORMAL HIGH (ref 3.8–10.6)

## 2013-05-28 LAB — BASIC METABOLIC PANEL
Anion Gap: 6 — ABNORMAL LOW (ref 7–16)
BUN: 19 mg/dL — ABNORMAL HIGH (ref 7–18)
Calcium, Total: 8.8 mg/dL (ref 8.5–10.1)
Chloride: 106 mmol/L (ref 98–107)
Co2: 28 mmol/L (ref 21–32)
Creatinine: 1.08 mg/dL (ref 0.60–1.30)
EGFR (African American): >60
EGFR (Non-African Amer.): >60
Glucose: 121 mg/dL — ABNORMAL HIGH (ref 65–99)
Osmolality: 283 (ref 275–301)
Potassium: 3.8 mmol/L (ref 3.5–5.1)
Sodium: 140 mmol/L (ref 136–145)

## 2013-05-28 LAB — URINALYSIS, COMPLETE
Bacteria: NONE SEEN
Bilirubin,UR: NEGATIVE
Glucose,UR: NEGATIVE mg/dL (ref 0–75)
Ketone: NEGATIVE
Nitrite: NEGATIVE
Ph: 6 (ref 4.5–8.0)
Protein: NEGATIVE
RBC,UR: 13 /HPF (ref 0–5)
Specific Gravity: 1.019 (ref 1.003–1.030)
Squamous Epithelial: 1
WBC UR: 42 /HPF (ref 0–5)

## 2013-12-21 ENCOUNTER — Emergency Department: Payer: Self-pay | Admitting: Emergency Medicine

## 2013-12-21 LAB — CBC WITH DIFFERENTIAL/PLATELET
Basophil #: 0.1 10*3/uL (ref 0.0–0.1)
Basophil %: 0.4 %
Eosinophil #: 0 10*3/uL (ref 0.0–0.7)
Eosinophil %: 0.1 %
HCT: 50.4 % (ref 40.0–52.0)
HGB: 16.5 g/dL (ref 13.0–18.0)
Lymphocyte #: 1.7 10*3/uL (ref 1.0–3.6)
Lymphocyte %: 11.5 %
MCH: 28.3 pg (ref 26.0–34.0)
MCHC: 32.7 g/dL (ref 32.0–36.0)
MCV: 86 fL (ref 80–100)
Monocyte #: 1.7 x10 3/mm — ABNORMAL HIGH (ref 0.2–1.0)
Monocyte %: 11.1 %
Neutrophil #: 11.6 10*3/uL — ABNORMAL HIGH (ref 1.4–6.5)
Neutrophil %: 76.9 %
Platelet: 257 10*3/uL (ref 150–440)
RBC: 5.83 10*6/uL (ref 4.40–5.90)
RDW: 14.4 % (ref 11.5–14.5)
WBC: 15.2 10*3/uL — ABNORMAL HIGH (ref 3.8–10.6)

## 2013-12-21 LAB — COMPREHENSIVE METABOLIC PANEL
Albumin: 3.8 g/dL (ref 3.4–5.0)
Alkaline Phosphatase: 78 U/L
Anion Gap: 9 (ref 7–16)
BUN: 36 mg/dL — ABNORMAL HIGH (ref 7–18)
Bilirubin,Total: 0.5 mg/dL (ref 0.2–1.0)
Calcium, Total: 9.2 mg/dL (ref 8.5–10.1)
Chloride: 109 mmol/L — ABNORMAL HIGH (ref 98–107)
Co2: 22 mmol/L (ref 21–32)
Creatinine: 1.56 mg/dL — ABNORMAL HIGH (ref 0.60–1.30)
EGFR (African American): 52 — ABNORMAL LOW
EGFR (Non-African Amer.): 45 — ABNORMAL LOW
Glucose: 119 mg/dL — ABNORMAL HIGH (ref 65–99)
Osmolality: 289 (ref 275–301)
Potassium: 3.7 mmol/L (ref 3.5–5.1)
SGOT(AST): 31 U/L (ref 15–37)
SGPT (ALT): 28 U/L (ref 12–78)
Sodium: 140 mmol/L (ref 136–145)
Total Protein: 8 g/dL (ref 6.4–8.2)

## 2013-12-21 LAB — TROPONIN I: Troponin-I: <0.02 ng/mL

## 2013-12-24 ENCOUNTER — Inpatient Hospital Stay: Payer: Self-pay | Admitting: Surgery

## 2013-12-24 DIAGNOSIS — K315 Obstruction of duodenum: Secondary | ICD-10-CM

## 2013-12-24 LAB — URINALYSIS, COMPLETE
Bacteria: NONE SEEN
Bilirubin,UR: NEGATIVE
Glucose,UR: NEGATIVE mg/dL (ref 0–75)
Ketone: NEGATIVE
Nitrite: NEGATIVE
Ph: 5 (ref 4.5–8.0)
Protein: NEGATIVE
RBC,UR: 6 /HPF (ref 0–5)
Specific Gravity: 1.03 (ref 1.003–1.030)
Squamous Epithelial: 5
WBC UR: 9 /HPF (ref 0–5)

## 2013-12-24 LAB — CBC
HCT: 50.7 % (ref 40.0–52.0)
HGB: 17.5 g/dL (ref 13.0–18.0)
MCH: 29.5 pg (ref 26.0–34.0)
MCHC: 34.5 g/dL (ref 32.0–36.0)
MCV: 86 fL (ref 80–100)
Platelet: 232 10*3/uL (ref 150–440)
RBC: 5.91 10*6/uL — ABNORMAL HIGH (ref 4.40–5.90)
RDW: 14.3 % (ref 11.5–14.5)
WBC: 8.4 10*3/uL (ref 3.8–10.6)

## 2013-12-24 LAB — COMPREHENSIVE METABOLIC PANEL
Albumin: 3.8 g/dL (ref 3.4–5.0)
Alkaline Phosphatase: 91 U/L
Anion Gap: 4 — ABNORMAL LOW (ref 7–16)
BUN: 23 mg/dL — ABNORMAL HIGH (ref 7–18)
Bilirubin,Total: 0.7 mg/dL (ref 0.2–1.0)
Calcium, Total: 9 mg/dL (ref 8.5–10.1)
Chloride: 103 mmol/L (ref 98–107)
Co2: 31 mmol/L (ref 21–32)
Creatinine: 1 mg/dL (ref 0.60–1.30)
EGFR (African American): >60
EGFR (Non-African Amer.): >60
Glucose: 117 mg/dL — ABNORMAL HIGH (ref 65–99)
Osmolality: 280 (ref 275–301)
Potassium: 3.4 mmol/L — ABNORMAL LOW (ref 3.5–5.1)
SGOT(AST): 16 U/L (ref 15–37)
SGPT (ALT): 27 U/L (ref 12–78)
Sodium: 138 mmol/L (ref 136–145)
Total Protein: 8.1 g/dL (ref 6.4–8.2)

## 2013-12-24 LAB — LIPASE, BLOOD: Lipase: 83 U/L (ref 73–393)

## 2013-12-24 LAB — TROPONIN I: Troponin-I: <0.02 ng/mL

## 2013-12-25 LAB — CBC WITH DIFFERENTIAL/PLATELET
Basophil #: 0 10*3/uL (ref 0.0–0.1)
Basophil %: 0.5 %
Eosinophil #: 0.9 10*3/uL — ABNORMAL HIGH (ref 0.0–0.7)
Eosinophil %: 10.2 %
HCT: 45.3 % (ref 40.0–52.0)
HGB: 15.1 g/dL (ref 13.0–18.0)
Lymphocyte #: 1 10*3/uL (ref 1.0–3.6)
Lymphocyte %: 11.6 %
MCH: 28.8 pg (ref 26.0–34.0)
MCHC: 33.3 g/dL (ref 32.0–36.0)
MCV: 86 fL (ref 80–100)
Monocyte #: 0.8 x10 3/mm (ref 0.2–1.0)
Monocyte %: 8.7 %
Neutrophil #: 6.2 10*3/uL (ref 1.4–6.5)
Neutrophil %: 69 %
Platelet: 207 10*3/uL (ref 150–440)
RBC: 5.25 10*6/uL (ref 4.40–5.90)
RDW: 14.4 % (ref 11.5–14.5)
WBC: 9 10*3/uL (ref 3.8–10.6)

## 2013-12-25 LAB — COMPREHENSIVE METABOLIC PANEL
Albumin: 3.1 g/dL — ABNORMAL LOW (ref 3.4–5.0)
Alkaline Phosphatase: 106 U/L
Anion Gap: 7 (ref 7–16)
BUN: 19 mg/dL — ABNORMAL HIGH (ref 7–18)
Bilirubin,Total: 1 mg/dL (ref 0.2–1.0)
Calcium, Total: 8.1 mg/dL — ABNORMAL LOW (ref 8.5–10.1)
Chloride: 107 mmol/L (ref 98–107)
Co2: 28 mmol/L (ref 21–32)
Creatinine: 0.89 mg/dL (ref 0.60–1.30)
EGFR (African American): >60
EGFR (Non-African Amer.): >60
Glucose: 89 mg/dL (ref 65–99)
Osmolality: 285 (ref 275–301)
Potassium: 3.3 mmol/L — ABNORMAL LOW (ref 3.5–5.1)
SGOT(AST): 71 U/L — ABNORMAL HIGH (ref 15–37)
SGPT (ALT): 111 U/L — ABNORMAL HIGH (ref 12–78)
Sodium: 142 mmol/L (ref 136–145)
Total Protein: 6.5 g/dL (ref 6.4–8.2)

## 2013-12-26 ENCOUNTER — Encounter: Payer: Self-pay | Admitting: General Surgery

## 2013-12-26 LAB — CBC WITH DIFFERENTIAL/PLATELET
Basophil #: 0 10*3/uL (ref 0.0–0.1)
Basophil %: 0.5 %
Eosinophil #: 0.9 10*3/uL — ABNORMAL HIGH (ref 0.0–0.7)
Eosinophil %: 9.7 %
HCT: 43 % (ref 40.0–52.0)
HGB: 14.5 g/dL (ref 13.0–18.0)
Lymphocyte #: 1.5 10*3/uL (ref 1.0–3.6)
Lymphocyte %: 16.7 %
MCH: 28.9 pg (ref 26.0–34.0)
MCHC: 33.8 g/dL (ref 32.0–36.0)
MCV: 86 fL (ref 80–100)
Monocyte #: 1 x10 3/mm (ref 0.2–1.0)
Monocyte %: 11.1 %
Neutrophil #: 5.8 10*3/uL (ref 1.4–6.5)
Neutrophil %: 62 %
Platelet: 183 10*3/uL (ref 150–440)
RBC: 5.03 10*6/uL (ref 4.40–5.90)
RDW: 13.9 % (ref 11.5–14.5)
WBC: 9.3 10*3/uL (ref 3.8–10.6)

## 2013-12-26 LAB — CEA: CEA: 3.8 ng/mL (ref 0.0–4.7)

## 2013-12-26 LAB — BASIC METABOLIC PANEL
Anion Gap: 5 — ABNORMAL LOW (ref 7–16)
BUN: 15 mg/dL (ref 7–18)
Calcium, Total: 8.3 mg/dL — ABNORMAL LOW (ref 8.5–10.1)
Chloride: 106 mmol/L (ref 98–107)
Co2: 28 mmol/L (ref 21–32)
Creatinine: 0.83 mg/dL (ref 0.60–1.30)
EGFR (African American): >60
EGFR (Non-African Amer.): >60
Glucose: 78 mg/dL (ref 65–99)
Osmolality: 277 (ref 275–301)
Potassium: 3.8 mmol/L (ref 3.5–5.1)
Sodium: 139 mmol/L (ref 136–145)

## 2013-12-27 LAB — CBC WITH DIFFERENTIAL/PLATELET
Basophil #: 0.1 10*3/uL (ref 0.0–0.1)
Basophil %: 0.6 %
Eosinophil #: 0.9 10*3/uL — ABNORMAL HIGH (ref 0.0–0.7)
Eosinophil %: 8.5 %
HCT: 40.4 % (ref 40.0–52.0)
HGB: 14.2 g/dL (ref 13.0–18.0)
Lymphocyte #: 1.4 10*3/uL (ref 1.0–3.6)
Lymphocyte %: 13.4 %
MCH: 29.8 pg (ref 26.0–34.0)
MCHC: 35.1 g/dL (ref 32.0–36.0)
MCV: 85 fL (ref 80–100)
Monocyte #: 1.3 x10 3/mm — ABNORMAL HIGH (ref 0.2–1.0)
Monocyte %: 12.4 %
Neutrophil #: 6.7 10*3/uL — ABNORMAL HIGH (ref 1.4–6.5)
Neutrophil %: 65.1 %
Platelet: 175 10*3/uL (ref 150–440)
RBC: 4.76 10*6/uL (ref 4.40–5.90)
RDW: 13.9 % (ref 11.5–14.5)
WBC: 10.3 10*3/uL (ref 3.8–10.6)

## 2013-12-27 LAB — COMPREHENSIVE METABOLIC PANEL
Albumin: 2.8 g/dL — ABNORMAL LOW (ref 3.4–5.0)
Alkaline Phosphatase: 88 U/L
Anion Gap: 7 (ref 7–16)
BUN: 12 mg/dL (ref 7–18)
Bilirubin,Total: 1 mg/dL (ref 0.2–1.0)
Calcium, Total: 8.1 mg/dL — ABNORMAL LOW (ref 8.5–10.1)
Chloride: 104 mmol/L (ref 98–107)
Co2: 24 mmol/L (ref 21–32)
Creatinine: 0.72 mg/dL (ref 0.60–1.30)
EGFR (African American): >60
EGFR (Non-African Amer.): >60
Glucose: 75 mg/dL (ref 65–99)
Osmolality: 269 (ref 275–301)
Potassium: 3.7 mmol/L (ref 3.5–5.1)
SGOT(AST): 19 U/L (ref 15–37)
SGPT (ALT): 49 U/L (ref 12–78)
Sodium: 135 mmol/L — ABNORMAL LOW (ref 136–145)
Total Protein: 6.3 g/dL — ABNORMAL LOW (ref 6.4–8.2)

## 2013-12-28 LAB — CBC WITH DIFFERENTIAL/PLATELET
Basophil #: 0.1 10*3/uL (ref 0.0–0.1)
Basophil %: 0.9 %
Eosinophil #: 0.7 10*3/uL (ref 0.0–0.7)
Eosinophil %: 8.5 %
HCT: 40 % (ref 40.0–52.0)
HGB: 13.7 g/dL (ref 13.0–18.0)
Lymphocyte #: 1.5 10*3/uL (ref 1.0–3.6)
Lymphocyte %: 18.1 %
MCH: 29.3 pg (ref 26.0–34.0)
MCHC: 34.1 g/dL (ref 32.0–36.0)
MCV: 86 fL (ref 80–100)
Monocyte #: 1.2 x10 3/mm — ABNORMAL HIGH (ref 0.2–1.0)
Monocyte %: 15.2 %
Neutrophil #: 4.6 10*3/uL (ref 1.4–6.5)
Neutrophil %: 57.3 %
Platelet: 185 10*3/uL (ref 150–440)
RBC: 4.65 10*6/uL (ref 4.40–5.90)
RDW: 14 % (ref 11.5–14.5)
WBC: 8.1 10*3/uL (ref 3.8–10.6)

## 2013-12-28 LAB — COMPREHENSIVE METABOLIC PANEL
Albumin: 2.6 g/dL — ABNORMAL LOW (ref 3.4–5.0)
Alkaline Phosphatase: 75 U/L
Anion Gap: 8 (ref 7–16)
BUN: 10 mg/dL (ref 7–18)
Bilirubin,Total: 0.8 mg/dL (ref 0.2–1.0)
Calcium, Total: 8.4 mg/dL — ABNORMAL LOW (ref 8.5–10.1)
Chloride: 105 mmol/L (ref 98–107)
Co2: 24 mmol/L (ref 21–32)
Creatinine: 0.81 mg/dL (ref 0.60–1.30)
EGFR (African American): >60
EGFR (Non-African Amer.): >60
Glucose: 88 mg/dL (ref 65–99)
Osmolality: 272 (ref 275–301)
Potassium: 3.8 mmol/L (ref 3.5–5.1)
SGOT(AST): 16 U/L (ref 15–37)
SGPT (ALT): 37 U/L (ref 12–78)
Sodium: 137 mmol/L (ref 136–145)
Total Protein: 6 g/dL — ABNORMAL LOW (ref 6.4–8.2)

## 2013-12-30 LAB — COMPREHENSIVE METABOLIC PANEL
Albumin: 2.7 g/dL — ABNORMAL LOW (ref 3.4–5.0)
Alkaline Phosphatase: 76 U/L
Anion Gap: 7 (ref 7–16)
BUN: 5 mg/dL — ABNORMAL LOW (ref 7–18)
Bilirubin,Total: 0.4 mg/dL (ref 0.2–1.0)
Calcium, Total: 8.6 mg/dL (ref 8.5–10.1)
Chloride: 100 mmol/L (ref 98–107)
Co2: 28 mmol/L (ref 21–32)
Creatinine: 0.78 mg/dL (ref 0.60–1.30)
EGFR (African American): >60
EGFR (Non-African Amer.): >60
Glucose: 137 mg/dL — ABNORMAL HIGH (ref 65–99)
Osmolality: 269 (ref 275–301)
Potassium: 3.9 mmol/L (ref 3.5–5.1)
SGOT(AST): 24 U/L (ref 15–37)
SGPT (ALT): 41 U/L (ref 12–78)
Sodium: 135 mmol/L — ABNORMAL LOW (ref 136–145)
Total Protein: 6.3 g/dL — ABNORMAL LOW (ref 6.4–8.2)

## 2013-12-30 LAB — CBC WITH DIFFERENTIAL/PLATELET
Basophil #: 0 10*3/uL (ref 0.0–0.1)
Basophil %: 0.3 %
Eosinophil #: 0 10*3/uL (ref 0.0–0.7)
Eosinophil %: 0.2 %
HCT: 41.5 % (ref 40.0–52.0)
HGB: 14.2 g/dL (ref 13.0–18.0)
Lymphocyte #: 1 10*3/uL (ref 1.0–3.6)
Lymphocyte %: 10.8 %
MCH: 29.2 pg (ref 26.0–34.0)
MCHC: 34.1 g/dL (ref 32.0–36.0)
MCV: 85 fL (ref 80–100)
Monocyte #: 1 x10 3/mm (ref 0.2–1.0)
Monocyte %: 11 %
Neutrophil #: 7.3 10*3/uL — ABNORMAL HIGH (ref 1.4–6.5)
Neutrophil %: 77.7 %
Platelet: 228 10*3/uL (ref 150–440)
RBC: 4.86 10*6/uL (ref 4.40–5.90)
RDW: 14.1 % (ref 11.5–14.5)
WBC: 9.4 10*3/uL (ref 3.8–10.6)

## 2013-12-31 LAB — CBC WITH DIFFERENTIAL/PLATELET
Basophil #: 0.1 10*3/uL (ref 0.0–0.1)
Basophil %: 1 %
Eosinophil #: 0.8 10*3/uL — ABNORMAL HIGH (ref 0.0–0.7)
Eosinophil %: 11.3 %
HCT: 40.2 % (ref 40.0–52.0)
HGB: 13.5 g/dL (ref 13.0–18.0)
Lymphocyte #: 1.8 10*3/uL (ref 1.0–3.6)
Lymphocyte %: 25.4 %
MCH: 28.8 pg (ref 26.0–34.0)
MCHC: 33.5 g/dL (ref 32.0–36.0)
MCV: 86 fL (ref 80–100)
Monocyte #: 0.8 x10 3/mm (ref 0.2–1.0)
Monocyte %: 10.8 %
Neutrophil #: 3.6 10*3/uL (ref 1.4–6.5)
Neutrophil %: 51.5 %
Platelet: 221 10*3/uL (ref 150–440)
RBC: 4.67 10*6/uL (ref 4.40–5.90)
RDW: 14.4 % (ref 11.5–14.5)
WBC: 7 10*3/uL (ref 3.8–10.6)

## 2013-12-31 LAB — COMPREHENSIVE METABOLIC PANEL
Albumin: 2.6 g/dL — ABNORMAL LOW (ref 3.4–5.0)
Alkaline Phosphatase: 67 U/L
Anion Gap: 2 — ABNORMAL LOW (ref 7–16)
BUN: 4 mg/dL — ABNORMAL LOW (ref 7–18)
Bilirubin,Total: 0.3 mg/dL (ref 0.2–1.0)
Calcium, Total: 8.6 mg/dL (ref 8.5–10.1)
Chloride: 104 mmol/L (ref 98–107)
Co2: 31 mmol/L (ref 21–32)
Creatinine: 1.03 mg/dL (ref 0.60–1.30)
EGFR (African American): >60
EGFR (Non-African Amer.): >60
Glucose: 100 mg/dL — ABNORMAL HIGH (ref 65–99)
Osmolality: 271 (ref 275–301)
Potassium: 3.5 mmol/L (ref 3.5–5.1)
SGOT(AST): 25 U/L (ref 15–37)
SGPT (ALT): 35 U/L (ref 12–78)
Sodium: 137 mmol/L (ref 136–145)
Total Protein: 6.1 g/dL — ABNORMAL LOW (ref 6.4–8.2)

## 2013-12-31 LAB — CK TOTAL AND CKMB (NOT AT ARMC)
CK, Total: 36 U/L — ABNORMAL LOW
CK-MB: 1.8 ng/mL (ref 0.5–3.6)

## 2013-12-31 LAB — TROPONIN I: Troponin-I: <0.02 ng/mL

## 2014-01-01 LAB — CK TOTAL AND CKMB (NOT AT ARMC)
CK, Total: 29 U/L — ABNORMAL LOW
CK, Total: 28 U/L — ABNORMAL LOW
CK-MB: 1.8 ng/mL (ref 0.5–3.6)
CK-MB: 1.9 ng/mL (ref 0.5–3.6)

## 2014-01-01 LAB — TROPONIN I
Troponin-I: 0.03 ng/mL
Troponin-I: 0.07 ng/mL — ABNORMAL HIGH

## 2014-01-02 IMAGING — CT CT CHEST W/ CM
1 series · 15 of 33 positions shown, 19 images · IV contrast (agent unspecified)
Comparison: none

REASON FOR EXAM: labs 1st   6 mo fu pulmonary nodules
COMMENTS:

PROCEDURE:     CT  - CT CHEST WITH CONTRAST  - February 05, 2012 [DATE]
RESULT:
TECHNIQUE: CT of the chest is performed with 75 ml of 9sovue-C88 iodinated
intravenous contrast with images reconstructed in the axial plane at 3.0 mm
slice thickness.
Comparison is made to previous imaging studies dated 13 February, 2011 and 16 July, 2011.

[Series 2: soft tissue · axial · 0.79mm/px · z∈[-338,-54]mm · 15 of 113 slices shown, 19 images]
[im 9/113  mediastinal]
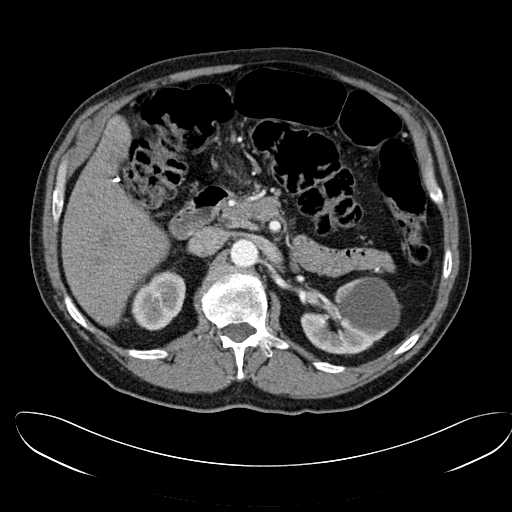
[im 9/113  lung]
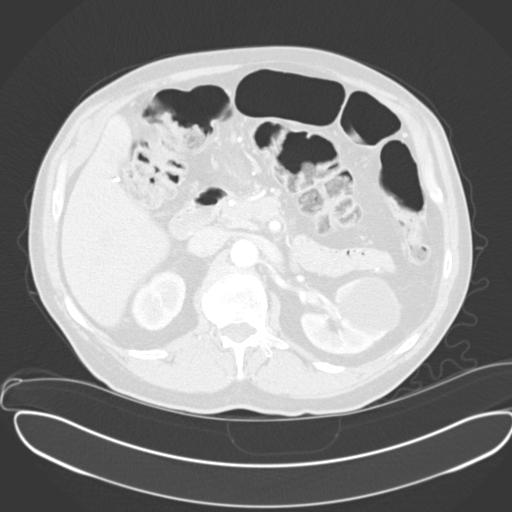
[im 17/113  lung]
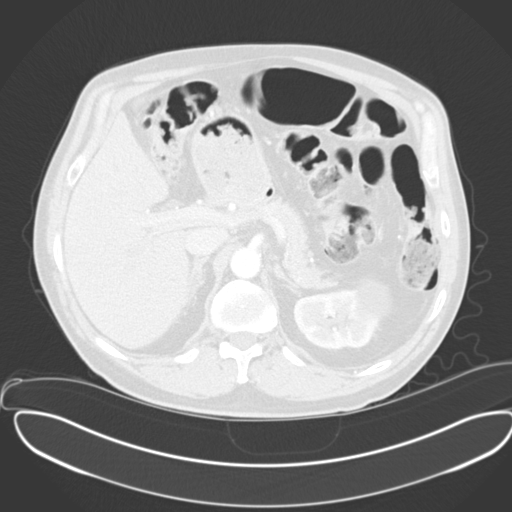
[im 23/113  lung]
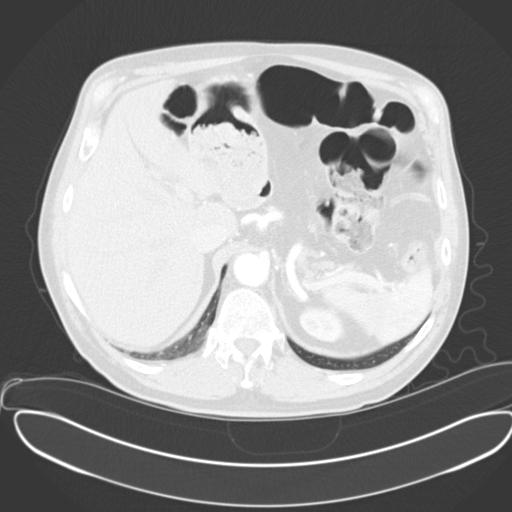
[im 30/113  lung]
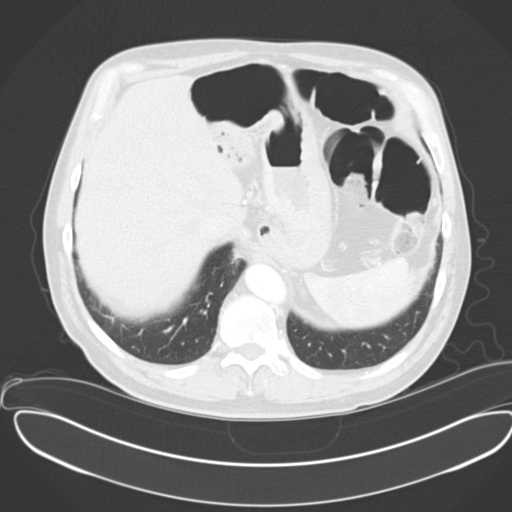
[im 38/113  mediastinal]
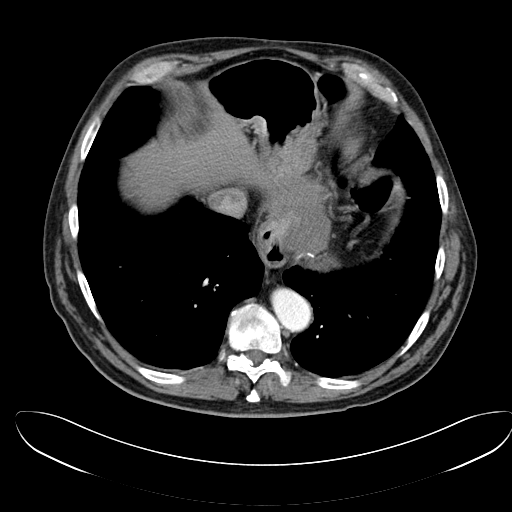
[im 38/113  lung]
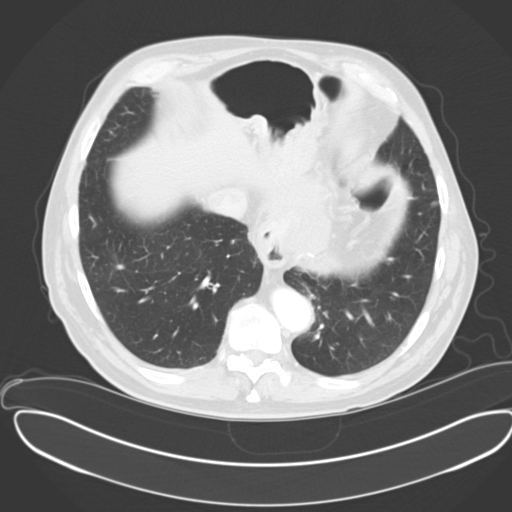
[im 45/113  lung]
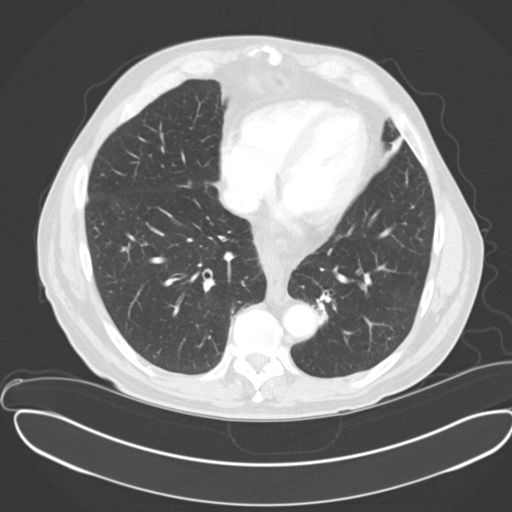
[im 50/113  lung]
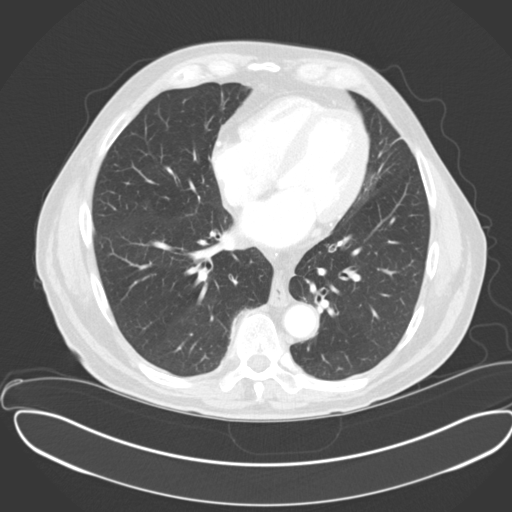
[im 59/113  lung]
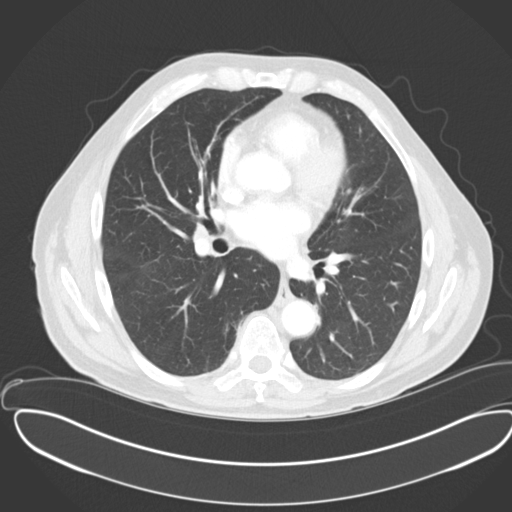
[im 63/113  mediastinal]
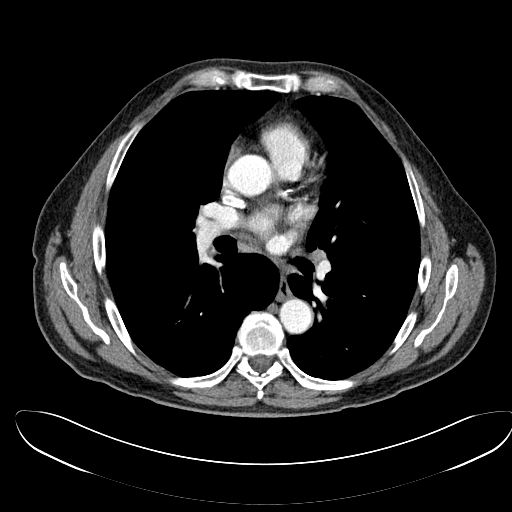
[im 63/113  lung]
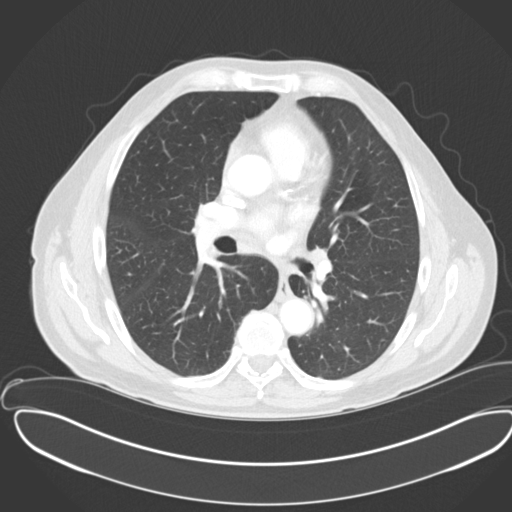
[im 68/113  lung]
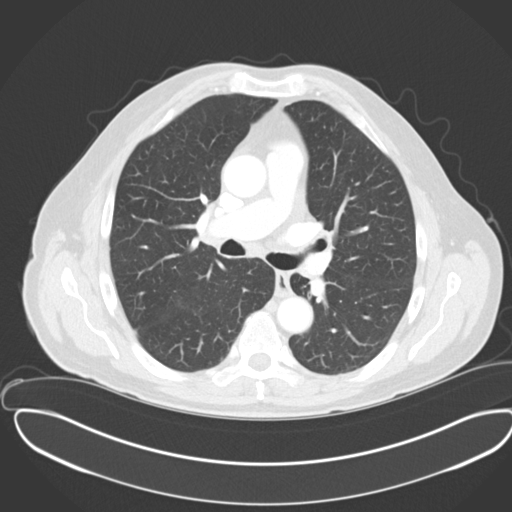
[im 75/113  lung]
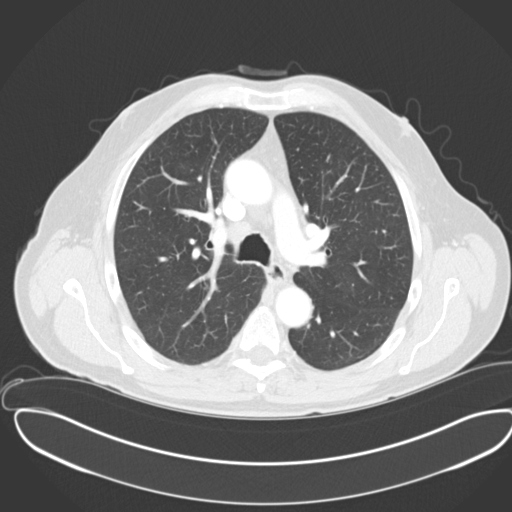
[im 83/113  lung]
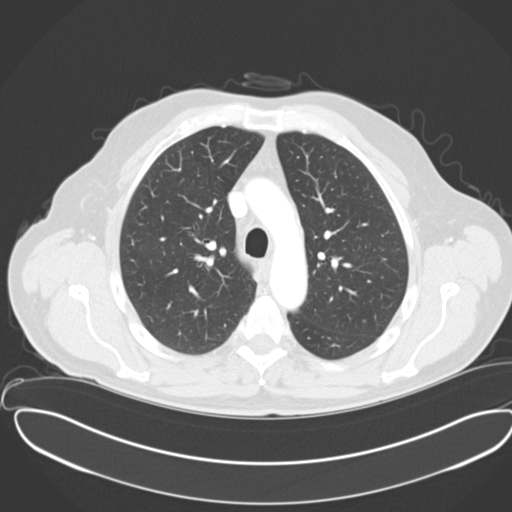
[im 90/113  mediastinal]
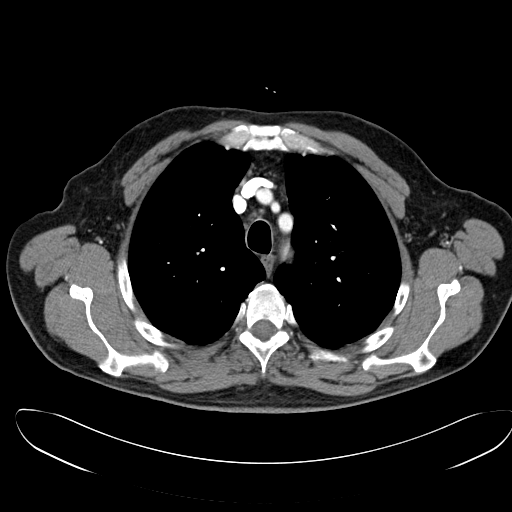
[im 90/113  lung]
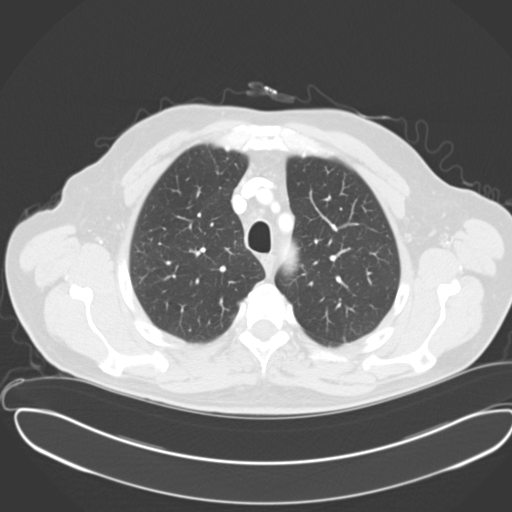
[im 96/113  lung]
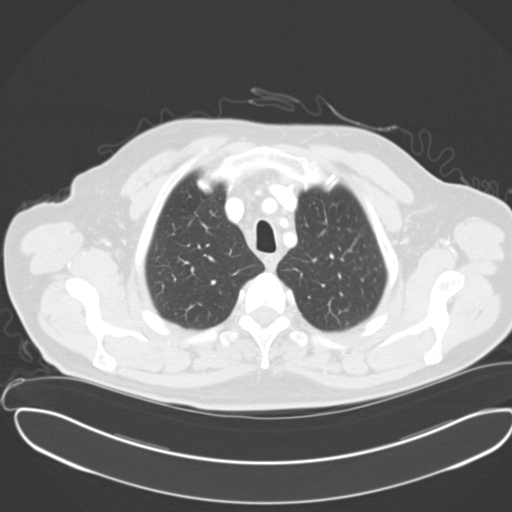
[im 104/113  lung]
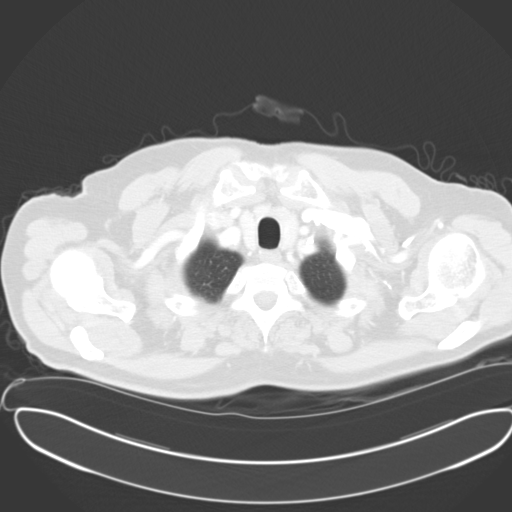

[15 of 33 positions shown; findings below may reference images not displayed]

FINDINGS: There is some low attenuation posteriorly in the left thyroid lobe
which is unchanged. Both thyroid lobes protrude fairly posterior but do not
cause significant crowding of the esophagus. Borderline enlarged pretracheal
lymph node has a short axis measurement of 9.0 mm on image 36 with shotty AP
window lymph nodes present. No subcarinal or hilar mass or adenopathy is
evident. The thoracic aorta is normal in appearance. The pulmonary arteries
appear to opacify normally. No pleural or pericardial effusion is evident.
There is no axillary or supraclavicular mass or adenopathy demonstrated.
There is a stable nodule in the lingula on image 75 of today's study
compared to image 46 of the previous study. There is some atelectasis or
fibrosis seen superior and medial to this which was not present to the same
extent previously. Mild peribronchial thickening is seen in the lower lobes.
Correlate for bronchitis. Minimal subpleural density in the inferior aspect
of the left upper lobe is unchanged on image 23 and currently measuring
approximately 3.5 mm. The included abdominal structures show cholecystectomy
clips are present. There is a simple cyst in the mid left kidney measuring
up to 4.9 cm diameter. The visualized spleen and pancreas appear
unremarkable. The adrenal glands appear unremarkable.
IMPRESSION: 1.  Small, hiatal hernia.
2.  Stable, subcentimeter pulmonary nodular densities. Borderline enlarged
pretracheal lymph node. Mild emphysematous disease. Some peribronchial
thickening in the lower lobes may be secondary to bronchitis. Correlate
clinically.

[REDACTED]

## 2014-01-03 LAB — CBC WITH DIFFERENTIAL/PLATELET
Basophil #: 0.1 10*3/uL (ref 0.0–0.1)
Basophil %: 1.2 %
Eosinophil #: 1.5 10*3/uL — ABNORMAL HIGH (ref 0.0–0.7)
Eosinophil %: 18.9 %
HCT: 38.6 % — ABNORMAL LOW (ref 40.0–52.0)
HGB: 13.3 g/dL (ref 13.0–18.0)
Lymphocyte #: 1.6 10*3/uL (ref 1.0–3.6)
Lymphocyte %: 19.9 %
MCH: 29.6 pg (ref 26.0–34.0)
MCHC: 34.4 g/dL (ref 32.0–36.0)
MCV: 86 fL (ref 80–100)
Monocyte #: 0.7 x10 3/mm (ref 0.2–1.0)
Monocyte %: 9 %
Neutrophil #: 4.2 10*3/uL (ref 1.4–6.5)
Neutrophil %: 51 %
Platelet: 248 10*3/uL (ref 150–440)
RBC: 4.48 10*6/uL (ref 4.40–5.90)
RDW: 14.2 % (ref 11.5–14.5)
WBC: 8.2 10*3/uL (ref 3.8–10.6)

## 2014-01-03 LAB — BASIC METABOLIC PANEL
Anion Gap: 8 (ref 7–16)
BUN: 7 mg/dL (ref 7–18)
Calcium, Total: 8.8 mg/dL (ref 8.5–10.1)
Chloride: 104 mmol/L (ref 98–107)
Co2: 25 mmol/L (ref 21–32)
Creatinine: 0.9 mg/dL (ref 0.60–1.30)
EGFR (African American): >60
EGFR (Non-African Amer.): >60
Glucose: 105 mg/dL — ABNORMAL HIGH (ref 65–99)
Osmolality: 272 (ref 275–301)
Potassium: 3.8 mmol/L (ref 3.5–5.1)
Sodium: 137 mmol/L (ref 136–145)

## 2014-01-05 LAB — COMPREHENSIVE METABOLIC PANEL
Albumin: 2.7 g/dL — ABNORMAL LOW (ref 3.4–5.0)
Alkaline Phosphatase: 68 U/L
Anion Gap: 3 — ABNORMAL LOW (ref 7–16)
BUN: 11 mg/dL (ref 7–18)
Bilirubin,Total: 0.5 mg/dL (ref 0.2–1.0)
Calcium, Total: 8.6 mg/dL (ref 8.5–10.1)
Chloride: 106 mmol/L (ref 98–107)
Co2: 27 mmol/L (ref 21–32)
Creatinine: 1.1 mg/dL (ref 0.60–1.30)
EGFR (African American): >60
EGFR (Non-African Amer.): >60
Glucose: 90 mg/dL (ref 65–99)
Osmolality: 271 (ref 275–301)
Potassium: 4.1 mmol/L (ref 3.5–5.1)
SGOT(AST): 34 U/L (ref 15–37)
SGPT (ALT): 64 U/L (ref 12–78)
Sodium: 136 mmol/L (ref 136–145)
Total Protein: 6 g/dL — ABNORMAL LOW (ref 6.4–8.2)

## 2014-01-05 LAB — CBC WITH DIFFERENTIAL/PLATELET
Basophil #: 0.1 10*3/uL (ref 0.0–0.1)
Basophil %: 1.2 %
Eosinophil #: 1.3 10*3/uL — ABNORMAL HIGH (ref 0.0–0.7)
Eosinophil %: 20.2 %
HCT: 38.8 % — ABNORMAL LOW (ref 40.0–52.0)
HGB: 12.8 g/dL — ABNORMAL LOW (ref 13.0–18.0)
Lymphocyte #: 1.7 10*3/uL (ref 1.0–3.6)
Lymphocyte %: 26.4 %
MCH: 28.5 pg (ref 26.0–34.0)
MCHC: 32.9 g/dL (ref 32.0–36.0)
MCV: 87 fL (ref 80–100)
Monocyte #: 0.6 x10 3/mm (ref 0.2–1.0)
Monocyte %: 9.8 %
Neutrophil #: 2.7 10*3/uL (ref 1.4–6.5)
Neutrophil %: 42.4 %
Platelet: 289 10*3/uL (ref 150–440)
RBC: 4.48 10*6/uL (ref 4.40–5.90)
RDW: 14.2 % (ref 11.5–14.5)
WBC: 6.4 10*3/uL (ref 3.8–10.6)

## 2014-01-18 DIAGNOSIS — J449 Chronic obstructive pulmonary disease, unspecified: Secondary | ICD-10-CM | POA: Insufficient documentation

## 2014-01-18 DIAGNOSIS — M1990 Unspecified osteoarthritis, unspecified site: Secondary | ICD-10-CM | POA: Insufficient documentation

## 2014-01-18 DIAGNOSIS — E538 Deficiency of other specified B group vitamins: Secondary | ICD-10-CM | POA: Insufficient documentation

## 2014-01-18 DIAGNOSIS — IMO0002 Reserved for concepts with insufficient information to code with codable children: Secondary | ICD-10-CM | POA: Insufficient documentation

## 2014-01-18 DIAGNOSIS — I10 Essential (primary) hypertension: Secondary | ICD-10-CM | POA: Insufficient documentation

## 2014-12-02 NOTE — Consult Note (Signed)
Chief Complaint:  Subjective/Chief Complaint Just given enema. No reponse yet. Sl liquid stool before. No more NG output   VITAL SIGNS/ANCILLARY NOTES: **Vital Signs.:   18-May-15 15:43  Vital Signs Type Q 4hr  Temperature Temperature (F) 98.7  Celsius 37  Temperature Source oral  Pulse Pulse 73  Respirations Respirations 18  Systolic BP Systolic BP 619  Diastolic BP (mmHg) Diastolic BP (mmHg) 80  Mean BP 107  Pulse Ox % Pulse Ox % 95  Pulse Ox Activity Level  At rest  Oxygen Delivery Room Air/ 21 %   Brief Assessment:  GEN no acute distress   Cardiac Regular   Respiratory clear BS   Gastrointestinal mld distension. Positive BS. Nontender   Lab Results: Routine Chem:  18-May-15 04:04   Glucose, Serum 78  BUN 15  Creatinine (comp) 0.83  Sodium, Serum 139  Potassium, Serum 3.8  Chloride, Serum 106  CO2, Serum 28  Calcium (Total), Serum  8.3  Anion Gap  5  Osmolality (calc) 277  eGFR (African American) >60  eGFR (Non-African American) >60 (eGFR values <43m/min/1.73 m2 may be an indication of chronic kidney disease (CKD). Calculated eGFR is useful in patients with stable renal function. The eGFR calculation will not be reliable in acutely ill patients when serum creatinine is changing rapidly. It is not useful in  patients on dialysis. The eGFR calculation may not be applicable to patients at the low and high extremes of body sizes, pregnant women, and vegetarians.)  Routine Hem:  18-May-15 04:04   WBC (CBC) 9.3  RBC (CBC) 5.03  Hemoglobin (CBC) 14.5  Hematocrit (CBC) 43.0  Platelet Count (CBC) 183  MCV 86  MCH 28.9  MCHC 33.8  RDW 13.9  Neutrophil % 62.0  Lymphocyte % 16.7  Monocyte % 11.1  Eosinophil % 9.7  Basophil % 0.5  Neutrophil # 5.8  Lymphocyte # 1.5  Monocyte # 1.0  Eosinophil #  0.9  Basophil # 0.0 (Result(s) reported on 26 Dec 2013 at 05:21AM.)   Radiology Results: XRay:    18-May-15 06:51, Abdomen Flat and Erect  Abdomen Flat and  Erect   REASON FOR EXAM:    f/u SBO  COMMENTS:       PROCEDURE: DXR - DXR ABDOMEN 2 V FLAT AND ERECT  - Dec 26 2013  6:51AM     CLINICAL DATA:  Bowel obstruction    EXAM:  ABDOMEN - 2 VIEW    COMPARISON:  Dec 25, 2013    FINDINGS:  Nasogastric tube tip and side port are in the proximal stomach with  the side port just beyond the gastroesophageal junction. There are a  few loops of modestly dilated small bowel remaining. Multiple  air-fluid levels, however, do remain throughout the abdomen and  pelvis. There is no appreciable air in the rectum. There are  multiple prostatic calculi. No free air. There are multiple surgical  clips in the upper abdomen.     IMPRESSION:  Findings felt to represent incomplete resolution of bowel  obstruction. Changes suggesting a degree of bowel obstruction to  remain. Note that the nasogastric tube tip and side port are in the  proximal stomach with the side port just beyond the gastroesophageal  junction. No free air. Multiple prostatic calculi present.      Electronically Signed    By: WLowella GripM.D.    On: 12/26/2013 08:07         Verified By: WLeafy Kindle WOODRUFF, M.D.,   Assessment/Plan:  Assessment/Plan:  Assessment Bowel obstructioin persists.   Plan Enemas. Plan for BE tomorrow. DIscussed with Dr. Tamala Julian.   Electronic Signatures: Verdie Shire (MD)  (Signed 18-May-15 19:23)  Authored: Chief Complaint, VITAL SIGNS/ANCILLARY NOTES, Brief Assessment, Lab Results, Radiology Results, Assessment/Plan   Last Updated: 18-May-15 19:23 by Verdie Shire (MD)

## 2014-12-02 NOTE — Consult Note (Signed)
Chief Complaint:  Subjective/Chief Complaint BE neg for colonic obstruction. Tried clears. Some abd distension. No abd pain so far today.   VITAL SIGNS/ANCILLARY NOTES: **Vital Signs.:   20-May-15 11:45  Vital Signs Type Q 4hr  Temperature Temperature (F) 97.8  Celsius 36.5  Temperature Source oral  Pulse Pulse 72  Respirations Respirations 17  Systolic BP Systolic BP 212  Diastolic BP (mmHg) Diastolic BP (mmHg) 82  Mean BP 103  Pulse Ox % Pulse Ox % 91  Pulse Ox Activity Level  With exertion; pt had just ambulated aroung nurses station  Oxygen Delivery Room Air/ 21 %   Brief Assessment:  Cardiac Regular   Respiratory clear BS   Gastrointestinal distended with decreased bowel sounds.   Lab Results: Hepatic:  20-May-15 04:09   Bilirubin, Total 0.8  Alkaline Phosphatase 75 (45-117 NOTE: New Reference Range 07/01/13)  SGPT (ALT) 37  SGOT (AST) 16  Total Protein, Serum  6.0  Albumin, Serum  2.6  Routine Chem:  20-May-15 04:09   Glucose, Serum 88  BUN 10  Creatinine (comp) 0.81  Sodium, Serum 137  Potassium, Serum 3.8  Chloride, Serum 105  CO2, Serum 24  Calcium (Total), Serum  8.4  Osmolality (calc) 272  eGFR (African American) >60  eGFR (Non-African American) >60 (eGFR values <63m/min/1.73 m2 may be an indication of chronic kidney disease (CKD). Calculated eGFR is useful in patients with stable renal function. The eGFR calculation will not be reliable in acutely ill patients when serum creatinine is changing rapidly. It is not useful in  patients on dialysis. The eGFR calculation may not be applicable to patients at the low and high extremes of body sizes, pregnant women, and vegetarians.)  Anion Gap 8  Routine Hem:  20-May-15 04:09   WBC (CBC) 8.1  RBC (CBC) 4.65  Hemoglobin (CBC) 13.7  Hematocrit (CBC) 40.0  Platelet Count (CBC) 185  MCV 86  MCH 29.3  MCHC 34.1  RDW 14.0  Neutrophil % 57.3  Lymphocyte % 18.1  Monocyte % 15.2  Eosinophil % 8.5   Basophil % 0.9  Neutrophil # 4.6  Lymphocyte # 1.5  Monocyte #  1.2  Eosinophil # 0.7  Basophil # 0.1 (Result(s) reported on 28 Dec 2013 at 05:16AM.)   Radiology Results: XRay:    19-May-15 12:04, Barium Enema Colon  Barium Enema Colon   REASON FOR EXAM:    Evaluate partial obstruction of transverse colon  COMMENTS:       PROCEDURE: FL  - FL BARIUM ENEMA (COLON)  - Dec 27 2013 12:04PM     CLINICAL DATA:  Evaluate partial obstruction involving transverse  colon.    EXAM:  SINGLE COLUMN BARIUM ENEMA    TECHNIQUE:  Initial scout AP supine abdominal image obtained to insure adequate  colon cleansing. Barium was introduced into the colon in a  retrograde fashion and refluxed from the rectum to the cecum. Spot  images of the colon followed by overhead radiographs were obtained.    FLUOROSCOPY TIME:  1 min 42 second    COMPARISON:  Abdominal radiographs 12/26/2013 and CT abdomen and  pelvis 12/24/2013    FINDINGS:  Scout radiograph demonstrates surgical clips in the right upper  quadrant. There is mild gaseous distension of the colon, including  transverse colon which was narrowed/decompressed on recent CT. Mild  gaseous dilatation of small bowel loops persists.    Contrast refluxed readily into the rectum and colon. The colon was  normal in caliber without stricture  or gross mass lesion identified.  Specifically, the transverse colon was normal in caliber without  narrowing seen on recent CT. Contrast refluxed to the level of the  cecum and appendix. No reflux into distal small bowel was achieved.  Post evacuation images demonstrate a mild amount of residual  contrast in the colon and rectum.     IMPRESSION:  Unremarkable single contrast examination of the colon. No stricture.      Electronically Signed    By: Logan Bores    On: 12/27/2013 17:50       Verified By: Ferol Luz, M.D.,    20-May-15 10:01, Abdomen Flat and Erect  Abdomen Flat and Erect   REASON  FOR EXAM:    ileus  COMMENTS:       PROCEDURE: DXR - DXR ABDOMEN 2 V FLAT AND ERECT  - Dec 28 2013 10:01AM     CLINICAL DATA:  Bowel obstruction    EXAM:  ABDOMEN - 2 VIEW    COMPARISON:  12/27/2013    FINDINGS:  Contrast in colon from prior be E.  Persistent dilatation of small bowel loops with decompressed colon  consistent with distal small-bowel obstruction.    Appendix opacified.    No small bowel wall thickening or free intraperitoneal air.    Lung bases clear with chronic elevation of LEFT diaphragm noted.    Bones demineralized.     IMPRESSION:  Small bowel obstruction.    Electronically Signed    By: Lavonia Dana M.D.    On: 12/28/2013 13:08         Verified By: Burnetta Sabin, M.D.,   Assessment/Plan:  Assessment/Plan:  Assessment Distal SBO. Slowly resolving.   Plan Will see how he does with advancing diet. If SBO worsens, will need surgery. If not, patient will need outpt EGD to evaluate his Barrett's. With his long segment Barrett's hx, he may be a candidate for Barryx. I discussed that with patient. Would still recommend colonoscopy as an outpt due to his hx of colon polyps. Single contrast r/o mass or stricuture but not sensitive for small polyps. I will sign off. Have patient f/u with [place] later. Will arrange those endoscopic procedures later. Thanks.   Electronic Signatures: Verdie Shire (MD)  (Signed 20-May-15 13:46)  Authored: Chief Complaint, VITAL SIGNS/ANCILLARY NOTES, Brief Assessment, Lab Results, Radiology Results, Assessment/Plan   Last Updated: 20-May-15 13:46 by Verdie Shire (MD)

## 2014-12-02 NOTE — Consult Note (Signed)
Brief Consult Note: Diagnosis: chest pain post op sbo.   Patient was seen by consultant.   Recommend further assessment or treatment.   Discussed with Attending MD.   Comments: 69 yo male who is post surgical intervention for sbo who was doing well until last pm when he moved form ice chips to apple and grape juice. He developed sudden onset of severe epigastric, midsternal and right sided chest pain. Improved with nitrates and morphine. He states if felt like his reflux pain. Pain was on right side of chest and worsened with deep breathing. This am, chest ct was negative for pulmonary embolus, He is pain free at present. Trvial troponin elevation. Does not appear to be having and acute coronary event. Appears to have been reflux related . WOuldcontinue with current meds. Does not appear to be ischemic. Will follow with you. WIll repeat ekg in am.  Electronic Signatures: Dalia HeadingFath, Mikiah Durall A (MD)  (Signed 24-May-15 11:15)  Authored: Brief Consult Note   Last Updated: 24-May-15 11:15 by Dalia HeadingFath, Eleanora Guinyard A (MD)

## 2014-12-02 NOTE — Discharge Summary (Signed)
PATIENT NAME:  Keith Craig, Keith Craig DATE OF BIRTH:  1946-02-07  DATE OF ADMISSION:  12/24/2013 DATE OF DISCHARGE:  01/05/2014  HISTORY OF PRESENT ILLNESS: This 69 year old male was admitted emergently with a chief complaint of abdominal pain. He notes that the week prior to admission he did have watery diarrhea, abdominal pain and distention and decreased oral intake. He came into the Emergency Room for further evaluation and had CT findings of distention of the stomach, small bowel and right colon. It appeared on CT he may have narrowing of the left side of his transverse colon. He was treated with nasogastric suction and had a large amount of drainage.   PAST MEDICAL HISTORY: Includes chronic obstructive pulmonary disease and history of prostatism. Last colonoscopy 2004.   PAST SURGICAL HISTORY: Includes previous laparoscopic hiatus hernia repair and laparoscopic cholecystectomy. Other details of past medical history are recorded on the typed H and P.   CLINICAL DATA: Included hemoglobin of 14.5, albumin 3.1, creatinine 0.83.   HOSPITAL COURSE:  He was continued for a period of time on nasogastric suction. He did have some bowel activity with passing gas and a small amount of stool but had persistent x-ray pictures consistent with partial small bowel obstruction. He was given multiple enemas and did have some results. He did have a barium enema which demonstrated no obstruction or narrowing of the colon. He did have persistent evidence of partial small bowel obstruction and continued inability to have satisfactory oral intake.   He was carried to the Operating Room on May 21st and had a laparotomy with lysis of an adhesive band which was partially obstructing the distal ileum. Postoperatively, he did have some evidence of ileus which lasted a number of days and eventually started passing gas and some stool and gradually improved. He was begun on a clear liquid diet and gradually advanced  to full liquids and to solid food, which he tolerated well. It is noted that on 1 of his final x-rays he had the sensation of needing to pass gas but tried to hold it until after the x-ray and this x-ray did demonstrate a large amount of colonic gas but subsequent to the x-ray he expelled a large amount of gas.   It is noted that during the postoperative period he did develop an episode of right chest pain and was seen by Dr. Lady GaryFath for cardiology consultation. It appeared that his pain was likely not related to his heart but more likely reflux esophagitis. His pain did gradually resolve.   FINAL DIAGNOSES:  1.  Partial small bowel obstruction.  2.  Postoperative ileus.  3.  Chronic gastroesophageal reflux.   DISCHARGE INSTRUCTIONS: Were given and these do include pantoprazole 40 mg daily. Other medicines are listed. New medicine is metoprolol 25 mg daily. Plans for discharge were discussed and plans made for followup in the office.   ____________________________ J. Renda RollsWilton Chloey Ricard, MD jws:cs D: 01/17/2014 17:02:14 ET T: 01/17/2014 20:22:49 ET JOB#: [id number]  cc: Adella HareJ. Wilton Miracle Criado, MD, <Dictator> Adella HareWILTON J Salina Stanfield MD ELECTRONICALLY SIGNED 01/18/2014 20:18

## 2014-12-02 NOTE — Consult Note (Signed)
Pt seen and examined. Full consult to follow. Diarrhea starting Monday. Increasing abd distension with nausea and diffuse abd pain starting Wed. CT suggests distal SBO. NG to suction started. Overall feels better with less abd distension. No abd pain today. NG aspirate bile and coffee ground material. 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 Had cholecystectomy mid 2012 as well as paraesophageal hernia repair in 12/12. Also, has hx of colon polyps. Last colon done in 08/2002, which was normal. Has not had repeat EGD or colon since. Need serial abd films to see if SBO resolves. Continue NG suction. Recommend daily PPI and moniter hgb. Will eventually need EGD with bx once SBO resolves. Will also need colonoscopy when patient can tolerate bowel prep. Will follow. Thanks  Electronic Signatures: Lutricia Feilh, Caterine Mcmeans (MD)  (Signed on 17-May-15 09:11)  Authored  Last Updated: 17-May-15 09:11 by Lutricia Feilh, Keaundre Thelin (MD)

## 2014-12-02 NOTE — Op Note (Signed)
PATIENT NAME:  Keith Craig, Keith Craig MR#:  [id number] DATE OF BIRTH:  March 05, 1946  DATE OF PROCEDURE:  12/29/2013  PREOPERATIVE DIAGNOSIS: Partial small bowel obstruction.  POSTOPERATIVE DIAGNOSIS: Partial small bowel obstruction.   PROCEDURE: Laparotomy with lysis of adhesion.   SURGEON: Renda RollsWilton Smith, M.D.   ANESTHESIA: General.   INDICATIONS FOR PROCEDURE: This 69 year old male came in the hospital with abdominal pain and had CT findings consistent with intestinal obstruction. He was given time for observation and did pass some gas and some loose stool, did have barium enema demonstrating no obstruction within the colon but the additional x-rays demonstrated persistent marked dilatation of the small bowel indicative of partial small bowel obstruction and surgery was recommended for definitive treatment.   DESCRIPTION OF PROCEDURE: The patient was placed on the operating table in the supine position under general endotracheal anesthesia. The abdomen was prepared with ChloraPrep, draped in a sterile manner.  A midline incision was made both above and below the umbilicus and carried around to the left of the umbilicus, carried down through subcutaneous tissues. Numerous small bleeding points were cauterized. The midline fascia was incised and the peritoneum was incised. The initial inspection revealed that there was marked dilatation of the small bowel containing gas and fluid. This was followed up to the ligament of Treitz and markedly dilated throughout. Next, it was followed distally to the terminal ileum to encounter a single adhesion which was causing the partial obstruction, which was divided, and released the obstruction. I could see that the bowel distal to this was normal size and proximal to this was markedly dilated. There was no compromise of the blood flow or color of the bowel. Next, some of the intestinal contents were milked distally into the colon. Further examination revealed there was no  palpable mass within the liver. There was no demonstrable ventral or inguinal hernia defect. Next, the hemostasis appeared to be intact. The fascia was examined. There was a small umbilical hernia with herniated properitoneal fat. This was reduced and the fascial edges were demonstrated. Next, the midline fascia was closed with interrupted 0 Maxon figure-of-eight sutures, and the skin was closed with clips. Dressings were applied with paper tape. The patient tolerated surgery satisfactorily and was prepared for transfer to the recovery room.  ____________________________ Shela CommonsJ. Renda RollsWilton Smith, MD jws:ce D: 12/29/2013 15:48:12 ET T: 12/29/2013 18:49:45 ET JOB#: [id number]  cc: Adella HareJ. Wilton Smith, MD, <Dictator> Adella HareWILTON J SMITH MD ELECTRONICALLY SIGNED 01/02/2014 9:15

## 2014-12-02 NOTE — H&P (Signed)
PATIENT NAME:  Keith Craig, Keith Craig MR#:  [id number] DATE OF BIRTH:  1946/07/26  DATE OF ADMISSION:  12/24/2013  REFERRING PHYSICIAN: Kathreen DevoidKevin A. Paduchowski, MD  FAMILY PHYSICIAN: Lora PaulaMimi McLaughlin, PA-C  REASON FOR ADMISSION: Abdominal pain.   HISTORY OF PRESENT ILLNESS: The patient is a 69 year old male with a history of benign hypertension, COPD/asthma, and trigeminal neuralgia. Presents to the Emergency Room with a 4-day history of abdominal pain associated with tightness and diarrhea. No actual nausea, vomiting. In the Emergency Room, the patient was noted to have a small bowel obstruction and colonic distention and is now admitted for further evaluation. No history of same.   PAST MEDICAL HISTORY: 1.  COPD/asthma. 2.  Benign hypertension.  3.  BPH.  4.  Trigeminal neuralgia.  5.  Chronic constipation.   MEDICATIONS: 1.  Protonix 40 mg p.o. b.i.d.  2.  Flomax 0.4 mg p.o. daily.  3.  Colace 100 mg p.o. b.i.d.  4.  Diovan 160 mg p.o. daily.  5.  Clonidine 0.2 mg p.o. b.i.d.  6.  Advair 250/50, 1 puff b.i.d.   ALLERGIES: No known drug allergies.   SOCIAL HISTORY: Negative for alcohol or tobacco abuse.   FAMILY HISTORY: Positive for hypertension and coronary artery disease.   REVIEW OF SYSTEMS:    CONSTITUTIONAL: No fever or change in weight.  EYES: No blurred or double vision. No glaucoma.  EARS, NOSE, THROAT: No tinnitus or hearing loss. No nasal discharge or bleeding. No difficulty swallowing.  RESPIRATORY: No cough or hemoptysis. No painful respiration.  CARDIOVASCULAR: No chest pain or orthopnea. No palpitations or syncope.  GASTROINTESTINAL: No nausea or vomiting.  GENITOURINARY: No dysuria or hematuria. No incontinence.  ENDOCRINE: No polyuria or polydipsia. No heat or cold intolerance.  HEMATOLOGIC: The patient denies anemia, easy bruising, or bleeding.  LYMPHATIC: No swollen glands.  MUSCULOSKELETAL: The patient denies pain in his neck, back, shoulders, knees, or hips. No  gout.  NEUROLOGIC: No numbness or migraines. Denies stroke or seizures.  PSYCHIATRIC: The patient denies anxiety, insomnia, or depression.   PHYSICAL EXAMINATION: GENERAL: The patient is in moderate distress due to abdominal pain.  VITAL SIGNS: Remarkable for a blood pressure of 139/91, heart rate 89, respiratory rate of 18, temperature of 97.5, sat 93% on room air.  HEENT: Normocephalic, atraumatic. Pupils equally round and reactive to light and accommodation. Extraocular movements are intact. Sclerae are anicteric. Conjunctivae are clear. Oropharynx is clear.  NECK: Supple without JVD. No adenopathy or thyromegaly is noted.  LUNGS: Reveal basilar rhonchi. No wheezes or rales. No dullness. Respiratory effort is normal.  CARDIAC: Regular rate and rhythm with normal S1, S2. No significant rubs, murmurs, or gallops. PMI is nondisplaced. Chest wall is nontender.  ABDOMEN: Distended and diffusely tender. No rebound or guarding. Hyperactive bowel sounds. No obvious organomegaly or masses appreciated.  EXTREMITIES: Without clubbing, cyanosis, or edema. Pulses were 2+ bilaterally.  SKIN: Warm and dry without rash or lesions.  NEUROLOGIC: Cranial nerves II through XII grossly intact. Deep tendon reflexes were symmetric. Motor and sensory exams nonfocal.  PSYCHIATRIC: Revealed a patient who was alert and oriented to person, place, and time. He was cooperative and used good judgment.   LABORATORY, DIAGNOSTIC, AND RADIOLOGICAL DATA: EKG revealed sinus rhythm with no acute ischemic changes. CT of the abdomen revealed a small bowel obstruction with some degree of right colonic obstruction. Urinalysis was negative. White count was 8.4 with a hemoglobin of 17.5. Glucose 117 with a BUN of 23, creatinine of 1,  sodium of 138, and a potassium of 3.4.   ASSESSMENT: 1.  Abdominal pain.  2.  Diarrhea.  3.  Bowel obstruction. 4.  Dehydration.  5.  Benign hypertension.  6.  Chronic obstructive pulmonary  disease/asthma.  7.  Hypokalemia.   PLAN: The patient will be admitted to the floor with an NG tube in place. He will be kept n.p.o. except for medications and ice. Will obtain urgent consults from surgery and GI. Begin empiric antibiotics. Follow up routine labs and abdominal films in the morning. Will supplement potassium at this time. Further treatment and evaluation will depend upon the patient's progress.   TOTAL TIME SPENT ON THIS PATIENT: 50 minutes.    ____________________________ Duane Lope Judithann Sheen, MD jds:jcm D: 12/24/2013 16:12:50 ET T: 12/24/2013 16:50:55 ET JOB#: [id number]  cc: Duane Lope. Judithann Sheen, MD, <Dictator> Maurine Minister, PA-C Donnesha Karg Rodena Medin MD ELECTRONICALLY SIGNED 12/24/2013 17:34

## 2014-12-02 NOTE — Consult Note (Signed)
PATIENT NAME:  Keith Craig, Keith Craig MR#:  [id number] DATE OF BIRTH:  15-Nov-1945  DATE OF CONSULTATION:  12/24/2013  REFERRING PHYSICIAN: Aram Beecham, MD  CONSULTING PHYSICIAN:  Earline Mayotte, MD  INDICATION FOR CONSULTATION: Possible small bowel obstruction.   CLINICAL NOTE: This 69 year old male was in his usual state of health until May 11 when he had a single episode of profound watery diarrhea. This was quite atypical from his normal soft bowel movement. This was associated with some abdominal distress. He had no further episodes that day. On 05/12 he presented to the walk-in clinic because of exacerbation of his chronic COPD. He reports receiving a steroid injection and given an oral prednisone taper to begin on 05/13. The following day he noticed his pulmonary status improved, but he noted increasing abdominal discomfort and distention over the course of the evening hours exacerbated by eating a [employer] hamburger on the way to a grandsons baseball game. This pain exacerbated to the point where he presented to the Emergency Department on the evening of May 13 after a six-hour wait he eloped home. On 05/14 he reported by having minimal oral intake and minimal activity. He had some improvement in his abdominal discomfort. He went to the [facility] walk-in clinic on 05/15 with progressive and worsening abdominal symptoms. He reports laboratory CT scan completed showing improvement in his white count (15,000 on 05/13), and being discharged home. His abdominal discomfort continued to worsen and he presented to the Emergency Room on the morning of Saturday 05/16, where he had a much better experience and was promptly evaluated and CT scan suggesting possible bowel obstruction, as well as the possibility of a transverse colon lesion. The patient reports that his bowels were somewhat normal on 05/12, but on 05/13 he had another loose stool and again on 05/15 a loose stool. He denies any blood. No vomiting. Mild  nausea.   PAST SURGICAL HISTORY: Notable for cholecystectomy in 2012 and later that year undergoing repair of a paraesophageal hernia with Nissen fundoplication. Both these procedures were completed by Derrill Kay, MD.  The patient has previously had colonic polyps identified, but by report had a clear exam in 2004 completed by Lynnae Prude, M.D.   The patient had a nasogastric tube passed in the Emergency Department with some improvement in his abdominal discomfort. He is seen now for assessment.   REGULAR MEDICATION: Consists of Advair Diskus 250/50 1 puff b.i.d., albuterol rescue inhaler by nebulizer p.r.n., clonidine 0.2 mg b.i.d.,finasteride  5 mg daily for benign prostatic hypertrophy, montelukast 10 mg p.o. daily for COPD, Protonix 40 mg b.i.d. for known Barrett's esophagus. Tamsulosin 0.4 mg daily for benign prostatic hypertrophy, Tylenol p.r.n., valsartan 160 mg daily for hypertension, and Ventolin HFA CFC-free inhaler 20 mg 2 puffs 4 times a day  as needed.   ALLERGIES: He denies  any medical allergies.   PHYSICAL EXAMINATION:  VITAL SIGNS: On admission to the floor showed blood pressure 170/89, pulse 93, respirations 22 and a normal temperature 97.9. Pulmonary oximetry was 98%.  HEAD AND NECK: Unremarkable.  CHEST: Mild wheeze in the left upper lobe. The lungs were otherwise clear with good air exchange.  CARDIAC: Regular rhythm without murmur or gallop.  ABDOMEN: The abdomen was distended. Bowel sounds were quiet. There was no focal tenderness or mass effect. No evidence of hernia at any of the multiple port sites from his cholecystectomy and hiatal hernia repair. No inguinal hernias appreciated. No lower extremity edema noted.   LABORATORY STUDIES:  Reviewed. At the time of his ED presentation on 05/13, he had a white blood cell count of 15,200 with left shift. This may have been in part from the steroid injection the day earlier. At the time of admission on 05/16 his white blood cell  count was 8,400. Hemoglobin 17.5, hematocrit 50.7. Hemoglobin in July 2012 was 15 suggesting a degree of dehydration. Electrolytes were notable for a creatinine of 1 with an estimated GFR greater than 60 and a mild depression of the serum sodium at 3.4. Liver function studies were normal.   Plain films of the abdomen showed distended small bowel and a paucity of left-sided colonic gas. CT scan suggested dilatation of the small bowel in the right transverse colon from obstructing long segment narrowing of the left transverse colon.   In the patient's current clinical exam of watery stools and decreased flatus, as well as abdominal distention is suggestive of obstructive process. Etiology unclear. While he does have a distant history of colonic polyps negative, exam was completed 11 years ago.   At this time, it is unlikely he would tolerate a prep. We will continue the patient's fluid resuscitation and NG drainage. Follow-up films will be obtained in the morning. If there is no improvement, a limited BE help clarify whether indeed a distal transverse colon lesion is present. If he shows improvement, he may be able to resolve this episode and be considered for a formal colonoscopy.   The patient will have repeat films in the morning and we will reassess at that time.  ____________________________ Earline MayotteJeffrey W. Byrnett, MD jwb:sg D: 12/25/2013 10:52:19 ET T: 12/25/2013 11:14:57 ET JOB#: [id number]  cc: Earline MayotteJeffrey W. Byrnett, MD, <Dictator> Duane LopeJeffrey D. Judithann SheenSparks, MD  JEFFREY Brion AlimentW BYRNETT MD ELECTRONICALLY SIGNED 12/26/2013 12:45

## 2014-12-02 NOTE — Consult Note (Signed)
PATIENT NAME:  Keith Craig, Keith Craig MR#:  [id number] DATE OF BIRTH:  12/29/1945  DATE OF CONSULTATION:  12/25/2013  CONSULTING PHYSICIAN:  Ezzard Standing. Bluford Kaufmann, MD  REASON FOR REFERRAL: Abdominal pain.   HISTORY OF PRESENT ILLNESS: The patient is a 69 year old white male with a known history of COPD and hypertension, who presents with at least 4-day history of worsening abdominal pain associated with abdominal distention and diarrhea. He also had some nausea but no vomiting. The patient presented to the ER where CT scan was done, which showed evidence of possible distal small bowel obstruction as well as possible colon obstruction as well. NG was placed for suction. With the NG suction, overall the patient is feeling better with less abdominal distention. There is no abdominal pain today. NG aspirate includes bile and possibly coffee-ground material.   In reviewing his medical records, he has a history of long segment Barrett's esophagus as well as paraesophageal hernia. He had a cholecystectomy in May 2012, as well as paraesophageal hernia repair in December 2012. Both of the surgeries were done by Dr. Renda Rolls. He also has a history of colonic polyps. Unfortunately, his last colonoscopy was done in 2004, which was normal. He has not had any repeat upper endoscopy or colonoscopy since.   PAST MEDICAL HISTORY: COPD, hypertension, BPH, trigeminal neuralgia.   HOME MEDICATIONS: Include Protonix 40 mg twice a day, Colace twice a day, Flomax daily, Diovan 160 mg daily, clonidine 0.2 mg twice a day, Advair inhaler.   ALLERGIES: He has no known drug allergies.   SOCIAL HISTORY: There is no tobacco or alcohol use.   FAMILY HISTORY: Notable for hypertension and coronary artery disease.   REVIEW OF SYMPTOMS: Already dictated by Dr. Judithann Sheen. There are no changes.   PHYSICAL EXAMINATION: GENERAL: Currently he is in no acute distress compared to yesterday when he presented.  VITAL SIGNS: He is afebrile. His vital  signs today are stable.  HEAD AND NECK: Within normal limits.  CARDIAC: Regular rhythm and rate.  LUNGS: Show some bilateral rhonchi.  ABDOMEN: Mildly distended. There is some mild diffuse tenderness, but no rebound or guarding. There are some decreased bowel sounds today. There is no hepatomegaly.  EXTREMITIES: No clubbing, cyanosis, or edema.   LABORATORY AND RADIOLOGICAL DATA: Showed a sodium of 142, potassium 3.3, BUN 19, creatinine 0.89. Liver enzymes were normal except for AST of 71 and ALT of 111. Troponin level was normal. White count 9.0, hemoglobin 15.1. CT scan of the abdomen showed dilated small bowel loops consistent with small bowel obstruction. There appears to be some degree of right colon obstruction as well, where there is narrowing in the left transverse colon.   IMPRESSION AND RECOMMENDATIONS: This is a patient with probable small bowel obstruction and possible colon obstruction as well, although the patient is having some bowel movements. I agree with continuing the NG suction. We need to do serial abdominal films to see whether the bowel obstruction is relieved or not. At some point because of the history of long segment Barrett's, he will need an upper endoscopy done. With possibility of coffee-ground material, we need to also look for any bleeding as well. I recommend that he stays on daily PPI. He will likely need a colonoscopy at some point. With possibility of a small bowel obstruction, the patient cannot be prepped. One option is to give him enemas until this clears so we can proceed with a colonoscopy or do a barium enema instead. Certainly if there  is a colonic obstruction, then he will need surgical resection also.   Thank you for the referral.   ____________________________ Ezzard StandingPaul Y. Bluford Kaufmannh, MD pyo:jcm D: 12/26/2013 16:59:10 ET T: 12/26/2013 17:57:15 ET JOB#: [id number]  cc: Ezzard StandingPaul Y. Bluford Kaufmannh, MD, <Dictator> Ezzard StandingPAUL Y Jemarcus Dougal MD ELECTRONICALLY SIGNED 12/28/2013 9:20

## 2014-12-03 NOTE — Discharge Summary (Signed)
PATIENT NAME:  Keith Craig, Keith Craig MR#:  [id number] DATE OF BIRTH:  [birth year]  DATE OF ADMISSION:  07/17/2011 DATE OF DISCHARGE:  07/22/2011  ADMITTING PHYSICIAN: Renda RollsWilton Smith, MD  ADMITTING DIAGNOSES:  1. Paraesophageal hiatus hernia. 2. Chronic gastroesophageal reflux.   DISCHARGE DIAGNOSES:  1. Paraesophageal hiatus hernia. 2. Chronic gastroesophageal reflux.   HISTORY OF PRESENT ILLNESS: The patient has a long history of gastroesophageal reflux and epigastric pain with endoscopic findings of large paraesophageal hiatus hernia. He presented for elective repair.   HOSPITAL COURSE: The patient underwent laparoscopic hiatus hernia repair with fundoplication on 07/17/2011. He did well with the procedure and was admitted to the floor after recovery. He was tolerating clear liquids the evening after surgery. He was not having any difficulty swallowing or nausea. His diet was advanced to full liquids. The next morning the patient had some nausea and diet was stopped. He felt better on postoperative day number four so the diet was readvanced. He had no further problems with the soft diet. He had minimal pain and did not need any pain medication after the first 24 hours. On the day of discharge, he was tolerating his diet and not needing any pain medication. His abdomen was soft and nontender. The incision is healing well. The discharge plan was discussed with the patient.   DISPOSITION: Discharged home in satisfactory condition with self-care.   DISCHARGE MEDICATIONS:  1. Protonix 40 mg twice a day. 2. Diovan 160 mg daily.  3. Clonidine 0.2 mg twice a day. 4. Advair Diskus 250/50 mcg inhaled twice a day.  5. ProAir HFA inhaler as needed.  6. Docusate sodium 100 mg two times a day as needed. 7. Tylenol as needed for pain.  8. Stop Protonix one week after surgery.   DISCHARGE INSTRUCTIONS: May shower. No heavy lifting. Diet is soft foods with small bites and thorough chewing. Followup in 1 to 2  weeks for recheck or sooner with any concerns. ____________________________ Ruffin FrederickAnn M. [place] A[place]ins, [place]PA amc:slb D: 08/01/2011 17:06:49 ET T: 08/02/2011 16:15:19 ET JOB#: [id number]  cc: Ruffin FrederickAnn M. Thomasena Edisollins, GeorgiaPA, <Dictator> Conny Situ M Dessa Ledee PA ELECTRONICALLY SIGNED 08/13/2011 8:10

## 2015-01-25 DIAGNOSIS — R0602 Shortness of breath: Secondary | ICD-10-CM | POA: Insufficient documentation

## 2015-02-05 ENCOUNTER — Other Ambulatory Visit: Payer: Self-pay | Admitting: Urology

## 2015-02-05 ENCOUNTER — Telehealth: Payer: Self-pay

## 2015-02-05 DIAGNOSIS — N401 Enlarged prostate with lower urinary tract symptoms: Principal | ICD-10-CM

## 2015-02-05 DIAGNOSIS — N138 Other obstructive and reflux uropathy: Secondary | ICD-10-CM

## 2015-02-05 MED ORDER — FINASTERIDE 5 MG PO TABS: 5.0000 mg | ORAL_TABLET | Freq: Every day | ORAL | Status: DC

## 2015-02-05 NOTE — Telephone Encounter (Signed)
Attempted to contact the pt to notify him that [facility] script was sent [facility] to [facility] [address]

## 2015-02-05 NOTE — Telephone Encounter (Signed)
Yes.  Script sent to [facility] on [address].

## 2015-02-05 NOTE — Telephone Encounter (Signed)
Can pt have refill on finasteride? Pt last seen on 11/08/14.

## 2015-04-30 ENCOUNTER — Encounter: Payer: Self-pay | Admitting: *Deleted

## 2015-04-30 ENCOUNTER — Other Ambulatory Visit: Payer: Self-pay | Admitting: *Deleted

## 2015-05-09 ENCOUNTER — Ambulatory Visit: Payer: Self-pay | Admitting: Urology

## 2015-11-16 ENCOUNTER — Other Ambulatory Visit: Payer: Self-pay | Admitting: Urology

## 2015-11-16 DIAGNOSIS — N400 Enlarged prostate without lower urinary tract symptoms: Secondary | ICD-10-CM

## 2015-12-22 ENCOUNTER — Other Ambulatory Visit: Payer: Self-pay | Admitting: Urology

## 2015-12-24 ENCOUNTER — Other Ambulatory Visit: Payer: Self-pay | Admitting: Urology

## 2015-12-31 ENCOUNTER — Ambulatory Visit (INDEPENDENT_AMBULATORY_CARE_PROVIDER_SITE_OTHER): Payer: Medicare Other | Admitting: Urology

## 2015-12-31 ENCOUNTER — Encounter: Payer: Self-pay | Admitting: Urology

## 2015-12-31 VITALS — BP 114/71 | HR 63 | Ht 72.0 in | Wt 218.8 lb

## 2015-12-31 DIAGNOSIS — N401 Enlarged prostate with lower urinary tract symptoms: Secondary | ICD-10-CM

## 2015-12-31 DIAGNOSIS — N138 Other obstructive and reflux uropathy: Secondary | ICD-10-CM

## 2015-12-31 DIAGNOSIS — R351 Nocturia: Secondary | ICD-10-CM | POA: Diagnosis not present

## 2015-12-31 MED ORDER — FINASTERIDE 5 MG PO TABS: 5.0000 mg | ORAL_TABLET | Freq: Every day | ORAL | Status: DC

## 2015-12-31 MED ORDER — TAMSULOSIN HCL 0.4 MG PO CAPS: 0.4000 mg | ORAL_CAPSULE | Freq: Every day | ORAL | Status: DC

## 2015-12-31 NOTE — Progress Notes (Signed)
12/31/2015 2:56 PM   Keith Craig 07/11/1946 [id number]  Referring provider: Marguarite ArbourJeffrey D Sparks, MD [address] [facility] [place], [place] [address]  Chief Complaint  Patient presents with  . Benign Prostatic Hypertrophy    1 year follow up    HPI: Patient is a 70 year old Caucasian male with a history of BPH with LUTS who presents today for his yearly visit.  BPH WITH LUTS His IPSS score today is 10, which is moderate lower urinary tract symptomatology. He is mostly satisfied with his quality life due to his urinary symptoms. His major complaint today nocturia x 2.  He has had these symptoms for many years.  He denies any dysuria, hematuria or suprapubic pain.  He currently taking tamsulosin 0.4 mg daily and finasteride 5 mg daily.  He also denies any recent fevers, chills, nausea or vomiting.  He does not have a family history of PCa.      IPSS      12/31/15 1400       International Prostate Symptom Score   How often have you had the sensation of not emptying your bladder? Not at All     How often have you had to urinate less than every two hours? Less than 1 in 5 times     How often have you found you stopped and started again several times when you urinated? Not at All     How often have you found it difficult to postpone urination? Not at All     How often have you had a weak urinary stream? More than half the time     How often have you had to strain to start urination? Not at All     How many times did you typically get up at night to urinate? 5 Times     Total IPSS Score 10     Quality of Life due to urinary symptoms   If you were to spend the rest of your life with your urinary condition just the way it is now how would you feel about that? Mostly Satisfied        Score:  1-7 Mild 8-19 Moderate 20-35 Severe     PMH: Past Medical History  Diagnosis Date  . Arthritis   . Chronic airway obstruction (HCC)   . Chronic obstructive asthma  (HCC)   . HTN (hypertension)   . Esophageal reflux   . BPH with obstruction/lower urinary tract symptoms   . Chills   . Over weight   . Benign essential microscopic hematuria     Surgical History: Past Surgical History  Procedure Laterality Date  . Cholecystectomy    . Hiatal hernia repair    . Rhizotomy    . Microvascular decompression of left trigeminal nerve    . Small bowel restrictions    . Cataract surgery Bilateral   . Urethral stricture dilatation      Home Medications:    Medication List       This list is accurate as of: 12/31/15  2:56 PM.  Always use your most recent med list.               ADVAIR DISKUS 250-50 MCG/DOSE Aepb  Generic drug:  Fluticasone-Salmeterol  INHALE 1 PUFF BY MOUTH EVERY 12 HOURS     albuterol 108 (90 Base) MCG/ACT inhaler  Commonly known as:  PROVENTIL HFA;VENTOLIN HFA  Inhale 2 puffs into the lungs every 6 (six) hours as  needed for wheezing or shortness of breath.     cloNIDine 0.2 MG tablet  Commonly known as:  CATAPRES  Take 0.2 mg by mouth 2 (two) times daily.     doxycycline 100 MG capsule  Commonly known as:  VIBRAMYCIN  Reported on 12/31/2015     finasteride 5 MG tablet  Commonly known as:  PROSCAR  Take 1 tablet (5 mg total) by mouth daily.     fluticasone 50 MCG/ACT nasal spray  Commonly known as:  FLONASE  Place into the nose.     levofloxacin 500 MG tablet  Commonly known as:  LEVAQUIN  Reported on 12/31/2015     metoprolol tartrate 25 MG tablet  Commonly known as:  LOPRESSOR  Take by mouth.     montelukast 4 MG chewable tablet  Commonly known as:  SINGULAIR  Chew 4 mg by mouth at bedtime.     pantoprazole 20 MG tablet  Commonly known as:  PROTONIX     predniSONE 10 MG tablet  Commonly known as:  DELTASONE     tamsulosin 0.4 MG Caps capsule  Commonly known as:  FLOMAX  Take 1 capsule (0.4 mg total) by mouth daily.     valsartan 160 MG tablet  Commonly known as:  DIOVAN  Take 160 mg by mouth  daily.        Allergies: No Known Allergies  Family History: Family History  Problem Relation Age of Onset  . Arthritis/Rheumatoid Father   . Kidney disease Neg Hx   . Prostate cancer Neg Hx     Social History:  reports that he has quit smoking. He does not have any smokeless tobacco history on file. He reports that he does not drink alcohol or use illicit drugs.  ROS: UROLOGY Frequent Urination?: No Hard to postpone urination?: No Burning/pain with urination?: No Get up at night to urinate?: Yes Leakage of urine?: No Urine stream starts and stops?: No Trouble starting stream?: No Do you have to strain to urinate?: No Blood in urine?: No Urinary tract infection?: No Sexually transmitted disease?: No Injury to kidneys or bladder?: No Painful intercourse?: No Weak stream?: No Erection problems?: No Penile pain?: No  Gastrointestinal Nausea?: No Vomiting?: No Indigestion/heartburn?: No Diarrhea?: No Constipation?: No  Constitutional Fever: No Night sweats?: No Weight loss?: No Fatigue?: No  Skin Skin rash/lesions?: No Itching?: No  Eyes Blurred vision?: No Double vision?: No  Ears/Nose/Throat Sore throat?: No Sinus problems?: No  Hematologic/Lymphatic Swollen glands?: No Easy bruising?: No  Cardiovascular Leg swelling?: No Chest pain?: No  Respiratory Cough?: No Shortness of breath?: No  Endocrine Excessive thirst?: No  Musculoskeletal Back pain?: No Joint pain?: No  Neurological Headaches?: No Dizziness?: No  Psychologic Depression?: No Anxiety?: No  Physical Exam: BP 114/71 mmHg  Pulse 63  Ht 6' (1.829 m)  Wt 218 lb 12.8 oz (99.247 kg)  BMI 29.67 kg/m2  Constitutional: Well nourished. Alert and oriented, No acute distress. HEENT: [place] AT, moist mucus membranes. Trachea midline, no masses. Cardiovascular: No clubbing, cyanosis, or edema. Respiratory: Normal respiratory effort, no increased work of breathing. GI: Abdomen  is soft, non tender, non distended, no abdominal masses. Liver and spleen not palpable.  No hernias appreciated.  Stool sample for occult testing is not indicated.   GU: No CVA tenderness.  No bladder fullness or masses.  Patient with circumcised phallus.  Urethral meatus is patent.  No penile discharge. No penile lesions or rashes. Scrotum without lesions, cysts, rashes  and/or edema.  Testicles are located scrotally bilaterally. No masses are appreciated in the testicles. Left and right epididymis are normal. Rectal: Patient with  normal sphincter tone. Anus and perineum without scarring or rashes. No rectal masses are appreciated. Prostate is approximately 55 grams, DRE limited to patient's body habitus, no nodules are appreciated. Seminal vesicles not palpated. Skin: No rashes, bruises or suspicious lesions. Lymph: No cervical or inguinal adenopathy. Neurologic: Grossly intact, no focal deficits, moving all 4 extremities. Psychiatric: Normal mood and affect.  Laboratory Data: Lab Results  Component Value Date   WBC 6.4 01/05/2014   HGB 12.8* 01/05/2014   HCT 38.8* 01/05/2014   MCV 87 01/05/2014   PLT 289 01/05/2014    Lab Results  Component Value Date   CREATININE 1.10 01/05/2014    PSA History  0.5 ng/mL on 11/08/2013  0.34 ng/mL on 01/18/2014  0.5 ng/mL on 05/10/2014  0.26 ng/mL on 01/16/2015  0.4 ng/mL on 12/31/2015  Lab Results  Component Value Date   AST 34 01/05/2014   Lab Results  Component Value Date   ALT 64 01/05/2014     Assessment & Plan:    1. BPH with LUTS  -IPSS score is 10/2   -Continue tamsulosin 0.4 mg daily and finasteride 5 mg daily  -RTC in 12 months for IPSS, PSA and exam   -PSA  2. Nocturia:   I explained to the patient that nocturia is often multi-factorial and difficult to treat.  Sleeping disorders, heart conditions and peripheral vascular disease, diabetes,  enlarged prostate or urethral stricture causing bladder outlet obstruction and/or  certain medications.  I have suggested that the patient avoid caffeine and alcohol in the evening.  He may also benefit from fluid restrictions after 6:00 in the evening and voiding just prior to bedtime.  He is not interested in these behavioral modifications as he does not find his nocturia bothersome to him at this time.   Return in about 1 year (around 12/30/2016) for IPSS, PSA an exam.  These notes generated with voice recognition software. I apologize for typographical errors.  Michiel Cowboy, PA-C  [facility] [address], [address] [place], [place] [address] [phone]

## 2016-01-01 ENCOUNTER — Telehealth: Payer: Self-pay

## 2016-01-01 LAB — PSA: Prostate Specific Ag, Serum: 0.4 ng/mL (ref 0.0–4.0)

## 2016-01-01 NOTE — Telephone Encounter (Signed)
-----   Message from Harle BattiestShannon A McGowan, PA-C sent at 01/01/2016 10:06 AM EDT ----- Patient's PSA is stable.  We will see him next.

## 2016-01-01 NOTE — Telephone Encounter (Signed)
LMOM- recent labs are stable.  

## 2016-01-03 DIAGNOSIS — R351 Nocturia: Secondary | ICD-10-CM | POA: Insufficient documentation

## 2016-01-03 DIAGNOSIS — N401 Enlarged prostate with lower urinary tract symptoms: Principal | ICD-10-CM

## 2016-01-03 DIAGNOSIS — N138 Other obstructive and reflux uropathy: Secondary | ICD-10-CM | POA: Insufficient documentation

## 2016-07-25 ENCOUNTER — Other Ambulatory Visit: Payer: Self-pay | Admitting: Internal Medicine

## 2016-07-25 DIAGNOSIS — R0602 Shortness of breath: Secondary | ICD-10-CM

## 2016-07-25 DIAGNOSIS — R1084 Generalized abdominal pain: Secondary | ICD-10-CM

## 2016-08-18 ENCOUNTER — Other Ambulatory Visit: Payer: Self-pay | Admitting: Internal Medicine

## 2016-08-18 DIAGNOSIS — R1084 Generalized abdominal pain: Secondary | ICD-10-CM

## 2016-08-20 ENCOUNTER — Ambulatory Visit
Admission: RE | Admit: 2016-08-20 | Discharge: 2016-08-20 | Disposition: A | Discharge status: Home / Self Care | Payer: Medicare Other | Source: Ambulatory Visit | Attending: Internal Medicine | Admitting: Internal Medicine

## 2016-08-20 DIAGNOSIS — R0602 Shortness of breath: Secondary | ICD-10-CM

## 2016-08-20 DIAGNOSIS — N400 Enlarged prostate without lower urinary tract symptoms: Secondary | ICD-10-CM | POA: Insufficient documentation

## 2016-08-20 DIAGNOSIS — R1084 Generalized abdominal pain: Secondary | ICD-10-CM

## 2016-08-20 DIAGNOSIS — K449 Diaphragmatic hernia without obstruction or gangrene: Secondary | ICD-10-CM | POA: Insufficient documentation

## 2016-08-20 DIAGNOSIS — N281 Cyst of kidney, acquired: Secondary | ICD-10-CM | POA: Diagnosis not present

## 2016-08-20 DIAGNOSIS — I2510 Atherosclerotic heart disease of native coronary artery without angina pectoris: Secondary | ICD-10-CM | POA: Insufficient documentation

## 2016-08-20 MED ORDER — IOPAMIDOL (ISOVUE-300) INJECTION 61%
100.0000 mL | Freq: Once | INTRAVENOUS | Status: AC | PRN
  Administered 2016-08-20: 100 mL via INTRAVENOUS

## 2016-12-23 ENCOUNTER — Other Ambulatory Visit: Payer: Self-pay

## 2016-12-23 ENCOUNTER — Other Ambulatory Visit: Payer: Medicare Other

## 2016-12-23 DIAGNOSIS — R1084 Generalized abdominal pain: Secondary | ICD-10-CM

## 2016-12-23 DIAGNOSIS — N401 Enlarged prostate with lower urinary tract symptoms: Secondary | ICD-10-CM

## 2016-12-24 LAB — PSA: Prostate Specific Ag, Serum: 0.4 ng/mL (ref 0.0–4.0)

## 2016-12-30 ENCOUNTER — Ambulatory Visit: Payer: Medicare Other | Admitting: Urology

## 2017-01-05 NOTE — Progress Notes (Signed)
01/06/2017 10:00 AM   Keith Craig [birth year] [id number]  Referring provider: Marguarite Arbour, MD [address] [facility] [place], [place] [address]  Chief Complaint  Patient presents with  . Benign Prostatic Hypertrophy    1 year follow up  . Nocturia    HPI: Patient is a 71 year old Caucasian male with BPH with LUTS who presents today for his yearly visit.  BPH WITH LUTS His IPSS score today is 7, which is mild  lower urinary tract symptomatology.  He is mostly satisfied with his quality life due to his urinary symptoms. His I PSS score was 10/2.  His major complaint today nocturia x 1.  He has had these symptoms for many years.  He denies any dysuria, hematuria or suprapubic pain.  He currently taking tamsulosin 0.4 mg daily and finasteride 5 mg daily.  He also denies any recent fevers, chills, nausea or vomiting.  He does not have a family history of PCa.     IPSS    Row Name 01/06/17 0900         International Prostate Symptom Score   How often have you had the sensation of not emptying your bladder? Not at All     How often have you had to urinate less than every two hours? Not at All     How often have you found you stopped and started again several times when you urinated? About half the time     How often have you found it difficult to postpone urination? Not at All     How often have you had a weak urinary stream? About half the time     How often have you had to strain to start urination? Not at All     How many times did you typically get up at night to urinate? 1 Time     Total IPSS Score 7       Quality of Life due to urinary symptoms   If you were to spend the rest of your life with your urinary condition just the way it is now how would you feel about that? Mostly Satisfied        Score:  1-7 Mild 8-19 Moderate 20-35 Severe  PMH: Past Medical History:  Diagnosis Date  . Arthritis   . Benign essential microscopic hematuria     . BPH with obstruction/lower urinary tract symptoms   . Chills   . Chronic airway obstruction (HCC)   . Chronic obstructive asthma (HCC)   . Esophageal reflux   . HTN (hypertension)   . Over weight     Surgical History: Past Surgical History:  Procedure Laterality Date  . cataract surgery Bilateral   . CHOLECYSTECTOMY    . HIATAL HERNIA REPAIR    . microvascular decompression of left trigeminal nerve    . RHIZOTOMY    . small bowel restrictions    . URETHRAL STRICTURE DILATATION      Home Medications:  Allergies as of 01/06/2017      Reactions   Vitamin B12 [cyanocobalamin] Itching, Rash      Medication List       Accurate as of 01/06/17 10:00 AM. Always use your most recent med list.          ADVAIR DISKUS 250-50 MCG/DOSE Aepb Generic drug:  Fluticasone-Salmeterol INHALE 1 PUFF BY MOUTH EVERY 12 HOURS   albuterol 108 (90 Base) MCG/ACT inhaler Commonly known as:  PROVENTIL HFA;VENTOLIN HFA  Inhale 2 puffs into the lungs every 6 (six) hours as needed for wheezing or shortness of breath.   cloNIDine 0.2 MG tablet Commonly known as:  CATAPRES Take 0.2 mg by mouth 2 (two) times daily.   doxycycline 100 MG capsule Commonly known as:  VIBRAMYCIN Reported on 12/31/2015   finasteride 5 MG tablet Commonly known as:  PROSCAR Take 1 tablet (5 mg total) by mouth daily.   fluticasone 50 MCG/ACT nasal spray Commonly known as:  FLONASE Place into the nose.   levofloxacin 500 MG tablet Commonly known as:  LEVAQUIN Reported on 12/31/2015   metoprolol tartrate 25 MG tablet Commonly known as:  LOPRESSOR Take by mouth.   montelukast 4 MG chewable tablet Commonly known as:  SINGULAIR Chew 4 mg by mouth at bedtime.   pantoprazole 20 MG tablet Commonly known as:  PROTONIX   predniSONE 10 MG tablet Commonly known as:  DELTASONE   tamsulosin 0.4 MG Caps capsule Commonly known as:  FLOMAX Take 1 capsule (0.4 mg total) by mouth daily.   valsartan 160 MG  tablet Commonly known as:  DIOVAN Take 160 mg by mouth daily.   vitamin B-12 1000 MCG tablet Commonly known as:  CYANOCOBALAMIN Take by mouth.       Allergies:  Allergies  Allergen Reactions  . Vitamin B12 [Cyanocobalamin] Itching and Rash    Family History: Family History  Problem Relation Age of Onset  . Arthritis/Rheumatoid Father   . Kidney disease Neg Hx   . Prostate cancer Neg Hx   . Kidney cancer Neg Hx   . Bladder Cancer Neg Hx     Social History:  reports that he quit smoking about 47 years ago. He has never used smokeless tobacco. He reports that he does not drink alcohol or use drugs.  ROS: UROLOGY Frequent Urination?: Yes Hard to postpone urination?: No Burning/pain with urination?: No Get up at night to urinate?: Yes Leakage of urine?: No Urine stream starts and stops?: No Trouble starting stream?: No Do you have to strain to urinate?: No Blood in urine?: No Urinary tract infection?: No Sexually transmitted disease?: No Injury to kidneys or bladder?: No Painful intercourse?: No Weak stream?: Yes Erection problems?: No Penile pain?: No  Gastrointestinal Nausea?: No Vomiting?: No Indigestion/heartburn?: No Diarrhea?: No Constipation?: No  Constitutional Fever: No Night sweats?: No Weight loss?: No Fatigue?: No  Skin Skin rash/lesions?: No Itching?: No  Eyes Blurred vision?: No Double vision?: No  Ears/Nose/Throat Sore throat?: No Sinus problems?: No  Hematologic/Lymphatic Swollen glands?: No Easy bruising?: No  Cardiovascular Leg swelling?: No Chest pain?: No  Respiratory Cough?: No Shortness of breath?: Yes  Endocrine Excessive thirst?: No  Musculoskeletal Back pain?: No Joint pain?: No  Neurological Headaches?: No Dizziness?: No  Psychologic Depression?: No Anxiety?: No  Physical Exam: BP 131/84   Pulse 67   Ht 6' (1.829 m)   Wt 229 lb 6.4 oz (104.1 kg)   BMI 31.11 kg/m   Constitutional: Well  nourished. Alert and oriented, No acute distress. HEENT: [place] AT, moist mucus membranes. Trachea midline, no masses. Cardiovascular: No clubbing, cyanosis, or edema. Respiratory: Normal respiratory effort, no increased work of breathing. GI: Abdomen is soft, non tender, non distended, no abdominal masses. Liver and spleen not palpable.  No hernias appreciated.  Stool sample for occult testing is not indicated.   GU: No CVA tenderness.  No bladder fullness or masses.  Patient with circumcised phallus.  Urethral meatus is patent.  No penile  discharge. No penile lesions or rashes. Scrotum without lesions, cysts, rashes and/or edema.  Testicles are located scrotally bilaterally. No masses are appreciated in the testicles. Left and right epididymis are normal. Rectal: Patient deferred exam.   Skin: No rashes, bruises or suspicious lesions. Lymph: No cervical or inguinal adenopathy. Neurologic: Grossly intact, no focal deficits, moving all 4 extremities. Psychiatric: Normal mood and affect.  Laboratory Data: Lab Results  Component Value Date   WBC 6.4 01/05/2014   HGB 12.8 (L) 01/05/2014   HCT 38.8 (L) 01/05/2014   MCV 87 01/05/2014   PLT 289 01/05/2014    Lab Results  Component Value Date   CREATININE 1.10 01/05/2014    PSA History  0.5 ng/mL on 11/08/2013  0.34 ng/mL on 01/18/2014  0.5 ng/mL on 05/10/2014  0.26 ng/mL on 01/16/2015  0.4 ng/mL on 12/31/2015  0.4 ng/mL on 12/23/2016  Lab Results  Component Value Date   AST 34 01/05/2014   Lab Results  Component Value Date   ALT 64 01/05/2014     Assessment & Plan:    1. BPH with LUTS  -IPSS score is 7/2, it is imprving   -Continue tamsulosin 0.4 mg daily and finasteride 5 mg daily  -RTC in 12 months for I PSS and exam    2. Nocturia  - nocturia is down to one a night  3. PSA screening  - I discussed the AUA Guideline's (2013) for men aged 70+ years or any man with less than a 10 to 15 year life expectancy that  screening is not recommended.  If the individual is in excellent health and after discussion it is decided to do a screening PSA, the threshold for biopsy should be raised to 10 ng/mL and if the PSA returns below 3 ng/mL, discontinue screening - patient's PSAs have been well below three over the last several years, will discontinue screening at this time    Return in about 1 year (around 01/06/2018) for IPSS and exam.  These notes generated with voice recognition software. I apologize for typographical errors.  Michiel CowboySHANNON Tracina Beaumont, PA-C  [facility] [address], [address] [place], [place] [address] [phone]

## 2017-01-06 ENCOUNTER — Ambulatory Visit (INDEPENDENT_AMBULATORY_CARE_PROVIDER_SITE_OTHER): Payer: Medicare Other | Admitting: Urology

## 2017-01-06 ENCOUNTER — Encounter: Payer: Self-pay | Admitting: Urology

## 2017-01-06 VITALS — BP 131/84 | HR 67 | Ht 72.0 in | Wt 229.4 lb

## 2017-01-06 DIAGNOSIS — N401 Enlarged prostate with lower urinary tract symptoms: Secondary | ICD-10-CM

## 2017-01-06 DIAGNOSIS — N138 Other obstructive and reflux uropathy: Secondary | ICD-10-CM

## 2017-01-06 DIAGNOSIS — R351 Nocturia: Secondary | ICD-10-CM

## 2017-01-06 DIAGNOSIS — Z125 Encounter for screening for malignant neoplasm of prostate: Secondary | ICD-10-CM | POA: Diagnosis not present

## 2017-01-06 MED ORDER — TAMSULOSIN HCL 0.4 MG PO CAPS: 0.4000 mg | ORAL_CAPSULE | Freq: Every day | ORAL | 12 refills | Status: DC

## 2017-01-06 MED ORDER — FINASTERIDE 5 MG PO TABS: 5.0000 mg | ORAL_TABLET | Freq: Every day | ORAL | 12 refills | Status: DC

## 2017-01-08 ENCOUNTER — Other Ambulatory Visit: Payer: Self-pay | Admitting: Urology

## 2017-01-08 DIAGNOSIS — N138 Other obstructive and reflux uropathy: Secondary | ICD-10-CM

## 2017-01-08 DIAGNOSIS — N401 Enlarged prostate with lower urinary tract symptoms: Principal | ICD-10-CM

## 2017-01-24 ENCOUNTER — Other Ambulatory Visit: Payer: Self-pay | Admitting: Urology

## 2017-01-24 DIAGNOSIS — R351 Nocturia: Secondary | ICD-10-CM

## 2017-07-28 ENCOUNTER — Encounter: Payer: Self-pay | Admitting: Emergency Medicine

## 2017-07-28 ENCOUNTER — Inpatient Hospital Stay
Admission: EM | Admit: 2017-07-28 | Discharge: 2017-08-06 | DRG: 389 | Disposition: A | Discharge status: Home / Self Care | Payer: Medicare Other | Attending: General Surgery | Admitting: General Surgery

## 2017-07-28 ENCOUNTER — Other Ambulatory Visit: Payer: Self-pay

## 2017-07-28 DIAGNOSIS — K56609 Unspecified intestinal obstruction, unspecified as to partial versus complete obstruction: Secondary | ICD-10-CM

## 2017-07-28 DIAGNOSIS — Z6829 Body mass index (BMI) 29.0-29.9, adult: Secondary | ICD-10-CM

## 2017-07-28 DIAGNOSIS — I10 Essential (primary) hypertension: Secondary | ICD-10-CM | POA: Diagnosis present

## 2017-07-28 DIAGNOSIS — J449 Chronic obstructive pulmonary disease, unspecified: Secondary | ICD-10-CM | POA: Diagnosis present

## 2017-07-28 DIAGNOSIS — Z87891 Personal history of nicotine dependence: Secondary | ICD-10-CM

## 2017-07-28 DIAGNOSIS — N401 Enlarged prostate with lower urinary tract symptoms: Secondary | ICD-10-CM | POA: Diagnosis present

## 2017-07-28 DIAGNOSIS — K219 Gastro-esophageal reflux disease without esophagitis: Secondary | ICD-10-CM | POA: Diagnosis present

## 2017-07-28 DIAGNOSIS — K5651 Intestinal adhesions [bands], with partial obstruction: Principal | ICD-10-CM | POA: Diagnosis present

## 2017-07-28 DIAGNOSIS — R1110 Vomiting, unspecified: Secondary | ICD-10-CM | POA: Diagnosis not present

## 2017-07-28 DIAGNOSIS — Z7951 Long term (current) use of inhaled steroids: Secondary | ICD-10-CM

## 2017-07-28 DIAGNOSIS — E669 Obesity, unspecified: Secondary | ICD-10-CM | POA: Diagnosis present

## 2017-07-28 DIAGNOSIS — N390 Urinary tract infection, site not specified: Secondary | ICD-10-CM | POA: Diagnosis present

## 2017-07-28 LAB — URINALYSIS, COMPLETE (UACMP) WITH MICROSCOPIC
Bilirubin Urine: NEGATIVE
Glucose, UA: NEGATIVE mg/dL
Ketones, ur: NEGATIVE mg/dL
Nitrite: NEGATIVE
Protein, ur: 30 mg/dL — AB
Specific Gravity, Urine: 1.031 — ABNORMAL HIGH (ref 1.005–1.030)
pH: 5 (ref 5.0–8.0)

## 2017-07-28 LAB — COMPREHENSIVE METABOLIC PANEL WITH GFR
ALT: 20 U/L (ref 17–63)
AST: 24 U/L (ref 15–41)
Albumin: 4 g/dL (ref 3.5–5.0)
Alkaline Phosphatase: 64 U/L (ref 38–126)
Anion gap: 9 (ref 5–15)
BUN: 24 mg/dL — ABNORMAL HIGH (ref 6–20)
CO2: 25 mmol/L (ref 22–32)
Calcium: 9.3 mg/dL (ref 8.9–10.3)
Chloride: 103 mmol/L (ref 101–111)
Creatinine, Ser: 1.22 mg/dL (ref 0.61–1.24)
GFR calc Af Amer: >60 mL/min
GFR calc non Af Amer: 58 mL/min — ABNORMAL LOW
Glucose, Bld: 128 mg/dL — ABNORMAL HIGH (ref 65–99)
Potassium: 3.5 mmol/L (ref 3.5–5.1)
Sodium: 137 mmol/L (ref 135–145)
Total Bilirubin: 1 mg/dL (ref 0.3–1.2)
Total Protein: 7.4 g/dL (ref 6.5–8.1)

## 2017-07-28 LAB — CBC
HCT: 48.1 % (ref 40.0–52.0)
Hemoglobin: 16.3 g/dL (ref 13.0–18.0)
MCH: 29.4 pg (ref 26.0–34.0)
MCHC: 33.9 g/dL (ref 32.0–36.0)
MCV: 86.7 fL (ref 80.0–100.0)
Platelets: 226 10*3/uL (ref 150–440)
RBC: 5.55 MIL/uL (ref 4.40–5.90)
RDW: 14 % (ref 11.5–14.5)
WBC: 12 10*3/uL — ABNORMAL HIGH (ref 3.8–10.6)

## 2017-07-28 LAB — LIPASE, BLOOD: Lipase: 19 U/L (ref 11–51)

## 2017-07-28 MED ORDER — SODIUM CHLORIDE 0.9 % IV BOLUS (SEPSIS)
1000.0000 mL | Freq: Once | INTRAVENOUS | Status: AC
  Administered 2017-07-29: 1000 mL via INTRAVENOUS

## 2017-07-28 MED ORDER — ONDANSETRON HCL 4 MG/2ML IJ SOLN
INTRAMUSCULAR | Status: AC
  Administered 2017-07-29: 4 mg via INTRAVENOUS
  Filled 2017-07-28: qty 2

## 2017-07-28 MED ORDER — ONDANSETRON HCL 4 MG/2ML IJ SOLN
4.0000 mg | Freq: Once | INTRAMUSCULAR | Status: AC
  Administered 2017-07-29: 4 mg via INTRAVENOUS

## 2017-07-28 NOTE — ED Notes (Signed)
Pt is not in the room at this time.

## 2017-07-28 NOTE — ED Triage Notes (Signed)
Patient to ER for c/o "severe" abd pain to lower abd bilaterally. Reports "my stomach is swollen and very tight". Patient reports pain began at approx 1800 last night, but went away. +Mild intermittent nausea. Denies any known fever, diarrhea, or vomiting. States he has only had 3 chicken strips today. Last BM was tonight. Denies any urinary symptoms other than his typical (takes medications for prostate).  Has h/o abdominal surgery and hernia.

## 2017-07-29 ENCOUNTER — Inpatient Hospital Stay: Payer: Medicare Other

## 2017-07-29 ENCOUNTER — Emergency Department: Payer: Medicare Other

## 2017-07-29 DIAGNOSIS — E669 Obesity, unspecified: Secondary | ICD-10-CM | POA: Diagnosis present

## 2017-07-29 DIAGNOSIS — I10 Essential (primary) hypertension: Secondary | ICD-10-CM | POA: Diagnosis not present

## 2017-07-29 DIAGNOSIS — K56609 Unspecified intestinal obstruction, unspecified as to partial versus complete obstruction: Secondary | ICD-10-CM | POA: Diagnosis not present

## 2017-07-29 DIAGNOSIS — Z6829 Body mass index (BMI) 29.0-29.9, adult: Secondary | ICD-10-CM | POA: Diagnosis not present

## 2017-07-29 DIAGNOSIS — Z87891 Personal history of nicotine dependence: Secondary | ICD-10-CM | POA: Diagnosis not present

## 2017-07-29 DIAGNOSIS — R1110 Vomiting, unspecified: Secondary | ICD-10-CM | POA: Diagnosis present

## 2017-07-29 DIAGNOSIS — J449 Chronic obstructive pulmonary disease, unspecified: Secondary | ICD-10-CM | POA: Diagnosis present

## 2017-07-29 DIAGNOSIS — Z7951 Long term (current) use of inhaled steroids: Secondary | ICD-10-CM | POA: Diagnosis not present

## 2017-07-29 DIAGNOSIS — K5669 Other partial intestinal obstruction: Secondary | ICD-10-CM | POA: Diagnosis not present

## 2017-07-29 DIAGNOSIS — K219 Gastro-esophageal reflux disease without esophagitis: Secondary | ICD-10-CM | POA: Diagnosis present

## 2017-07-29 DIAGNOSIS — N390 Urinary tract infection, site not specified: Secondary | ICD-10-CM | POA: Diagnosis present

## 2017-07-29 DIAGNOSIS — N401 Enlarged prostate with lower urinary tract symptoms: Secondary | ICD-10-CM | POA: Diagnosis present

## 2017-07-29 DIAGNOSIS — K5651 Intestinal adhesions [bands], with partial obstruction: Secondary | ICD-10-CM | POA: Diagnosis present

## 2017-07-29 LAB — CBC
HCT: 46.5 % (ref 40.0–52.0)
Hemoglobin: 15.6 g/dL (ref 13.0–18.0)
MCH: 29.6 pg (ref 26.0–34.0)
MCHC: 33.6 g/dL (ref 32.0–36.0)
MCV: 88.2 fL (ref 80.0–100.0)
Platelets: 217 10*3/uL (ref 150–440)
RBC: 5.27 MIL/uL (ref 4.40–5.90)
RDW: 14.3 % (ref 11.5–14.5)
WBC: 12.4 10*3/uL — ABNORMAL HIGH (ref 3.8–10.6)

## 2017-07-29 LAB — COMPREHENSIVE METABOLIC PANEL
ALT: 19 U/L (ref 17–63)
AST: 21 U/L (ref 15–41)
Albumin: 3.7 g/dL (ref 3.5–5.0)
Alkaline Phosphatase: 61 U/L (ref 38–126)
Anion gap: 9 (ref 5–15)
BUN: 20 mg/dL (ref 6–20)
CO2: 25 mmol/L (ref 22–32)
Calcium: 8.5 mg/dL — ABNORMAL LOW (ref 8.9–10.3)
Chloride: 105 mmol/L (ref 101–111)
Creatinine, Ser: 0.99 mg/dL (ref 0.61–1.24)
GFR calc Af Amer: >60 mL/min (ref >60)
GFR calc non Af Amer: >60 mL/min (ref >60)
Glucose, Bld: 136 mg/dL — ABNORMAL HIGH (ref 65–99)
Potassium: 3.5 mmol/L (ref 3.5–5.1)
Sodium: 139 mmol/L (ref 135–145)
Total Bilirubin: 0.9 mg/dL (ref 0.3–1.2)
Total Protein: 6.9 g/dL (ref 6.5–8.1)

## 2017-07-29 LAB — LACTIC ACID, PLASMA: Lactic Acid, Venous: 1.2 mmol/L (ref 0.5–1.9)

## 2017-07-29 LAB — MAGNESIUM: Magnesium: 1.7 mg/dL (ref 1.7–2.4)

## 2017-07-29 LAB — PHOSPHORUS: Phosphorus: 2.6 mg/dL (ref 2.5–4.6)

## 2017-07-29 LAB — TROPONIN I: Troponin I: <0.03 ng/mL (ref <0.03)

## 2017-07-29 MED ORDER — ONDANSETRON 4 MG PO TBDP
ORAL_TABLET | ORAL | Status: AC
  Filled 2017-07-29: qty 1

## 2017-07-29 MED ORDER — KCL IN DEXTROSE-NACL 20-5-0.45 MEQ/L-%-% IV SOLN
INTRAVENOUS | Status: DC
  Administered 2017-07-29 – 2017-08-06 (×17): via INTRAVENOUS
  Filled 2017-07-29 (×20): qty 1000

## 2017-07-29 MED ORDER — HYDRALAZINE HCL 20 MG/ML IJ SOLN: 10.0000 mg | INTRAMUSCULAR | Status: DC | PRN

## 2017-07-29 MED ORDER — ONDANSETRON HCL 4 MG/2ML IJ SOLN
INTRAMUSCULAR | Status: AC
  Filled 2017-07-29: qty 2

## 2017-07-29 MED ORDER — MORPHINE SULFATE (PF) 4 MG/ML IV SOLN
4.0000 mg | INTRAVENOUS | Status: DC | PRN
  Administered 2017-07-29: 4 mg via INTRAVENOUS
  Filled 2017-07-29: qty 1

## 2017-07-29 MED ORDER — PANTOPRAZOLE SODIUM 40 MG IV SOLR
40.0000 mg | Freq: Every day | INTRAVENOUS | Status: DC
  Administered 2017-07-29 – 2017-08-01 (×4): 40 mg via INTRAVENOUS
  Filled 2017-07-29 (×4): qty 40

## 2017-07-29 MED ORDER — DIPHENHYDRAMINE HCL 50 MG/ML IJ SOLN: 12.5000 mg | Freq: Four times a day (QID) | INTRAMUSCULAR | Status: DC | PRN

## 2017-07-29 MED ORDER — ENOXAPARIN SODIUM 40 MG/0.4ML ~~LOC~~ SOLN
40.0000 mg | SUBCUTANEOUS | Status: DC
  Administered 2017-07-29 – 2017-08-06 (×9): 40 mg via SUBCUTANEOUS
  Filled 2017-07-29 (×10): qty 0.4

## 2017-07-29 MED ORDER — ONDANSETRON 4 MG PO TBDP: 4.0000 mg | ORAL_TABLET | Freq: Four times a day (QID) | ORAL | Status: DC | PRN

## 2017-07-29 MED ORDER — MAGNESIUM SULFATE 2 GM/50ML IV SOLN
2.0000 g | Freq: Once | INTRAVENOUS | Status: AC
  Administered 2017-07-29: 2 g via INTRAVENOUS
  Filled 2017-07-29: qty 50

## 2017-07-29 MED ORDER — DIPHENHYDRAMINE HCL 12.5 MG/5ML PO ELIX
12.5000 mg | ORAL_SOLUTION | Freq: Four times a day (QID) | ORAL | Status: DC | PRN
  Filled 2017-07-29: qty 5

## 2017-07-29 MED ORDER — METOPROLOL TARTRATE 5 MG/5ML IV SOLN: 5.0000 mg | Freq: Four times a day (QID) | INTRAVENOUS | Status: DC | PRN

## 2017-07-29 MED ORDER — ONDANSETRON HCL 4 MG/2ML IJ SOLN
4.0000 mg | Freq: Once | INTRAMUSCULAR | Status: AC
  Administered 2017-07-29: 4 mg via INTRAVENOUS
  Filled 2017-07-29: qty 2

## 2017-07-29 MED ORDER — CEFTRIAXONE SODIUM IN DEXTROSE 20 MG/ML IV SOLN
1.0000 g | Freq: Once | INTRAVENOUS | Status: AC
  Administered 2017-07-29: 1 g via INTRAVENOUS
  Filled 2017-07-29: qty 50

## 2017-07-29 MED ORDER — ONDANSETRON HCL 4 MG/2ML IJ SOLN
4.0000 mg | Freq: Four times a day (QID) | INTRAMUSCULAR | Status: DC | PRN
  Administered 2017-07-29: 4 mg via INTRAVENOUS

## 2017-07-29 MED ORDER — METOPROLOL TARTRATE 5 MG/5ML IV SOLN
5.0000 mg | INTRAVENOUS | Status: DC | PRN
  Administered 2017-07-29 (×2): 5 mg via INTRAVENOUS
  Filled 2017-07-29 (×2): qty 5

## 2017-07-29 MED ORDER — ALBUTEROL SULFATE (2.5 MG/3ML) 0.083% IN NEBU: 2.5000 mg | INHALATION_SOLUTION | Freq: Four times a day (QID) | RESPIRATORY_TRACT | Status: DC | PRN

## 2017-07-29 MED ORDER — MOMETASONE FURO-FORMOTEROL FUM 200-5 MCG/ACT IN AERO
2.0000 | INHALATION_SPRAY | Freq: Two times a day (BID) | RESPIRATORY_TRACT | Status: DC
  Administered 2017-07-29 – 2017-08-06 (×17): 2 via RESPIRATORY_TRACT
  Filled 2017-07-29: qty 8.8

## 2017-07-29 MED ORDER — DIATRIZOATE MEGLUMINE & SODIUM 66-10 % PO SOLN
90.0000 mL | Freq: Once | ORAL | Status: AC
  Administered 2017-07-29: 90 mL via NASOGASTRIC

## 2017-07-29 MED ORDER — IOPAMIDOL (ISOVUE-300) INJECTION 61%
100.0000 mL | Freq: Once | INTRAVENOUS | Status: AC | PRN
  Administered 2017-07-29: 100 mL via INTRAVENOUS

## 2017-07-29 MED ORDER — NITROGLYCERIN 0.4 MG SL SUBL: 0.4000 mg | SUBLINGUAL_TABLET | SUBLINGUAL | Status: DC | PRN

## 2017-07-29 MED ORDER — DEXTROSE IN LACTATED RINGERS 5 % IV SOLN
INTRAVENOUS | Status: DC
  Administered 2017-07-29: 04:00:00 via INTRAVENOUS

## 2017-07-29 NOTE — H&P (Signed)
Patient ID: Keith Craig, male   DOB: 04/18/1946, 71 y.o.   MRN: [id number]  CC: Abdominal pain  HPI Keith Craig is a 71 y.o. male who presents to the emergency room with a 1 day history of lower abdominal pain.  Patient reports that approximately 24 hours before presenting to the emergency room developed nausea and vomiting.  He was unable to eat throughout the day because it caused worsening pain, nausea, vomiting.  He also describes feeling his abdomen was distended and like "bricks".  Patient reports that he has pain like this before that would resolve on its own.  He has had one prior admission for this identical problem which required an operation to relieve a small bowel obstruction.  The pain when it comes is in waves and is a crampy stabbing sensation.  It is over his entire abdomen and does not localize to any one area.  He denies any fevers or chills.  He has chronic shortness of breath from his COPD but denies any current chest pain.  His last bowel movement was earlier today and was loose.  He denies any recent changes in his health, travels, sick contacts.  HPI  Past Medical History:  Diagnosis Date  . Arthritis   . Benign essential microscopic hematuria   . BPH with obstruction/lower urinary tract symptoms   . Chills   . Chronic airway obstruction (HCC)   . Chronic obstructive asthma (HCC)   . Esophageal reflux   . HTN (hypertension)   . Over weight     Past Surgical History:  Procedure Laterality Date  . cataract surgery Bilateral   . CHOLECYSTECTOMY    . HIATAL HERNIA REPAIR    . microvascular decompression of left trigeminal nerve    . RHIZOTOMY    . small bowel restrictions    . URETHRAL STRICTURE DILATATION      Family History  Problem Relation Age of Onset  . Arthritis/Rheumatoid Father   . Kidney disease Neg Hx   . Prostate cancer Neg Hx   . Kidney cancer Neg Hx   . Bladder Cancer Neg Hx     Social History Social History   Tobacco Use  .  Smoking status: Former Smoker    Last attempt to quit: 1971    Years since quitting: 47.9  . Smokeless tobacco: Never Used  Substance Use Topics  . Alcohol use: No    Alcohol/week: 0.0 oz  . Drug use: No    Allergies  Allergen Reactions  . Vitamin B12 [Cyanocobalamin] Itching and Rash    Current Facility-Administered Medications  Medication Dose Route Frequency Provider Last Rate Last Dose  . cefTRIAXone (ROCEPHIN) 1 g in dextrose 5 % 50 mL IVPB - Premix  1 g Intravenous Once Rebecka Apley, MD 100 mL/hr at 07/29/17 0234 1 g at 07/29/17 0234   Current Outpatient Medications  Medication Sig Dispense Refill  . ADVAIR DISKUS 250-50 MCG/DOSE AEPB INHALE 1 PUFF BY MOUTH EVERY 12 HOURS  3  . albuterol (PROVENTIL HFA;VENTOLIN HFA) 108 (90 BASE) MCG/ACT inhaler Inhale 2 puffs into the lungs every 6 (six) hours as needed for wheezing or shortness of breath.    . cloNIDine (CATAPRES) 0.2 MG tablet Take 0.2 mg by mouth 2 (two) times daily.    Marland Kitchen doxycycline (VIBRAMYCIN) 100 MG capsule Reported on 12/31/2015  0  . finasteride (PROSCAR) 5 MG tablet Take 1 tablet (5 mg total) by mouth daily. 30 tablet 12  . finasteride (  PROSCAR) 5 MG tablet TAKE 1 TABLET (5 MG TOTAL) BY MOUTH DAILY. 30 tablet 9  . fluticasone (FLONASE) 50 MCG/ACT nasal spray Place into the nose.    . metoprolol tartrate (LOPRESSOR) 25 MG tablet Take by mouth.    . montelukast (SINGULAIR) 4 MG chewable tablet Chew 4 mg by mouth at bedtime.    . pantoprazole (PROTONIX) 20 MG tablet     . predniSONE (DELTASONE) 10 MG tablet     . tamsulosin (FLOMAX) 0.4 MG CAPS capsule TAKE ONE CAPSULE BY MOUTH DAILY 30 capsule 12  . valsartan (DIOVAN) 160 MG tablet Take 160 mg by mouth daily.    . vitamin B-12 (CYANOCOBALAMIN) 1000 MCG tablet Take by mouth.       Review of Systems A multi-point review of systems was asked and was negative except for the findings documented in the HPI  Physical Exam Blood pressure (!) 180/111, pulse 98,  temperature 99.1 F (37.3 C), temperature source Oral, resp. rate 18, height 6' (1.829 m), weight 104.3 kg (230 lb), SpO2 91 %. CONSTITUTIONAL: No acute distress. EYES: Pupils are equal, round, and reactive to light, Sclera are non-icteric. EARS, NOSE, MOUTH AND THROAT: The oropharynx is clear. The oral mucosa is pink and moist. Hearing is intact to voice. LYMPH NODES:  Lymph nodes in the neck are normal. RESPIRATORY:  Lungs are clear. There is normal respiratory effort, with equal breath sounds bilaterally, and without pathologic use of accessory muscles. CARDIOVASCULAR: Heart is regular without murmurs, gallops, or rubs. GI: The abdomen is large, soft, mildly tender to deep palpation in the mid abdomen, and moderately distended. There are no palpable masses. There is no hepatosplenomegaly. There are normal bowel sounds in all quadrants. GU: Rectal deferred.   MUSCULOSKELETAL: Normal muscle strength and tone. No cyanosis or edema.   SKIN: Turgor is good and there are no pathologic skin lesions or ulcers. NEUROLOGIC: Motor and sensation is grossly normal. Cranial nerves are grossly intact. PSYCH:  Oriented to person, place and time. Affect is normal.  Data Reviewed Images and labs reviewed which show a mild leukocytosis of 12.0, mild elevation of BUN of 24.  All the remainder of his blood work are within normal limits.  Patient did have laboratory evidence of a urinary tract infection but may be contaminated.  CT scan of the abdomen shows equalization of the small bowel with normal caliber distal small bowel consistent with a small bowel obstruction.  No evidence of free air or free fluid.  Also has a small hiatal hernia. I have personally reviewed the patient's imaging, laboratory findings and medical records.    Assessment    Small bowel obstruction    Plan    71 year old male with a small bowel obstruction seen on CT scan and by history and physical.  Discussed the diagnosis in detail.   Given that this appears to be in a different location than his prior obstruction discussed that we would provide him with NG tube decompression and allow 24-48 hours for his symptoms to improve.  Should he fail to improve discussed the possibility of needing an operation to relieve the obstruction.  Patient and his wife voiced understanding and agree with the plan for admission, IV hydration compression, serial labs and exams.  I will consult internal medicine to assist with his poorly controlled hypertension as well as his multiple other medical problems.     Time spent with the patient was 50 minutes, with more than 50% of the time  spent in face-to-face education, counseling and care coordination.     Ricarda Frameharles Jmya Uliano, MD FACS General Surgeon 07/29/2017, 2:45 AM

## 2017-07-29 NOTE — ED Notes (Signed)
Pt stating that he was experiencing some nausea and abdominal pain yesterday. Pt stating that when he woke this morning that he felt better but didn't eat all day until 1500. Pt stating that he ate chicken strips and about 1800 he began with abdominal pain, fullness, and n/v. Pt stating that he has been unable to stop vomiting and soon started with diarrhea also. Pt stating normal BM this afternoon prior to eating. Pt's emesis is green bile at this time.

## 2017-07-29 NOTE — ED Notes (Signed)
Patient transported to CT 

## 2017-07-29 NOTE — ED Notes (Signed)
Pt cont to have vomiting and diarrhea; st unable to get up off commode

## 2017-07-29 NOTE — Progress Notes (Signed)
Per MD okay for RN to place an order for EKG, pt complaining of chest pain.

## 2017-07-29 NOTE — Progress Notes (Signed)
Notified Dr. Amado CoeGouru about EKG results she will put new orders. Will continue to monitor pt.

## 2017-07-29 NOTE — ED Notes (Signed)
Pt in lobby restroom upon called for triage; pt actively vomiting and having diarrhea

## 2017-07-29 NOTE — Consult Note (Signed)
Reason for Consult:  Chief Complaint  Patient presents with  . Abdominal Pain   Referring Physician: Adonis Huguenin  Reason for consult elevated blood pressure Keith Craig is an 71 y.o. male. With past medical history of  essential hypertension, GERD, BPH and other medical problems was admitted the hospital to surgery service for small bowel obstruction. Patient is currently nothing by mouth and hospitalist team is consulted for elevated blood pressure. Patient reports 2/10  pain at this point. Resting comfortably. Wife at bedside.   Past Medical History:  Diagnosis Date  . Arthritis   . Benign essential microscopic hematuria   . BPH with obstruction/lower urinary tract symptoms   . Chills   . Chronic airway obstruction ([place])   . 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 Esophageal reflux   . HTN (hypertension)   . Over weight     Past Surgical History:  Procedure Laterality Date  . cataract surgery Bilateral   . CHOLECYSTECTOMY    . HIATAL HERNIA REPAIR    . microvascular decompression of left trigeminal nerve    . RHIZOTOMY    . small bowel restrictions    . URETHRAL STRICTURE DILATATION      Family History  Problem Relation Age of Onset  . Arthritis/Rheumatoid Father   . Kidney disease Neg Hx   . Prostate cancer Neg Hx   . Kidney cancer Neg Hx   . Bladder Cancer Neg Hx     Social History:  reports that he quit smoking about 47 years ago. he has never used smokeless tobacco. He reports that he does not drink alcohol or use drugs.  Allergies:  Allergies  Allergen Reactions  . Vitamin B12 [Cyanocobalamin] Itching and Rash    Medications: I have reviewed the patient's current medications.  Results for orders placed or performed during the hospital encounter of 07/28/17 (from the past 48 hour(s))  Lipase, blood     Status: None   Collection Time: 07/28/17  9:46 PM  Result Value Ref Range   Lipase 19 11 - 51 U/L  Comprehensive metabolic panel     Status: Abnormal    Collection Time: 07/28/17  9:46 PM  Result Value Ref Range   Sodium 137 135 - 145 mmol/L   Potassium 3.5 3.5 - 5.1 mmol/L   Chloride 103 101 - 111 mmol/L   CO2 25 22 - 32 mmol/L   Glucose, Bld 128 (H) 65 - 99 mg/dL   BUN 24 (H) 6 - 20 mg/dL   Creatinine, Ser 1.22 0.61 - 1.24 mg/dL   Calcium 9.3 8.9 - 10.3 mg/dL   Total Protein 7.4 6.5 - 8.1 g/dL   Albumin 4.0 3.5 - 5.0 g/dL   AST 24 15 - 41 U/L   ALT 20 17 - 63 U/L   Alkaline Phosphatase 64 38 - 126 U/L   Total Bilirubin 1.0 0.3 - 1.2 mg/dL   GFR calc non Af Amer 58 (L) >60 mL/min   GFR calc Af Amer >60 >60 mL/min    Comment: (NOTE) The eGFR has been calculated using the CKD EPI equation. This calculation has not been validated in all clinical situations. eGFR's persistently <60 mL/min signify possible Chronic Kidney Disease.    Anion gap 9 5 - 15  CBC     Status: Abnormal   Collection Time: 07/28/17  9:46 PM  Result Value Ref Range   WBC 12.0 (H) 3.8 - 10.6 K/uL   RBC 5.55 4.40 - 5.90 MIL/uL  Hemoglobin 16.3 13.0 - 18.0 g/dL   HCT 48.1 40.0 - 52.0 %   MCV 86.7 80.0 - 100.0 fL   MCH 29.4 26.0 - 34.0 pg   MCHC 33.9 32.0 - 36.0 g/dL   RDW 14.0 11.5 - 14.5 %   Platelets 226 150 - 440 K/uL  Urinalysis, Complete w Microscopic     Status: Abnormal   Collection Time: 07/28/17  9:46 PM  Result Value Ref Range   Color, Urine AMBER (A) YELLOW    Comment: BIOCHEMICALS MAY BE AFFECTED BY COLOR   APPearance CLOUDY (A) CLEAR   Specific Gravity, Urine 1.031 (H) 1.005 - 1.030   pH 5.0 5.0 - 8.0   Glucose, UA NEGATIVE NEGATIVE mg/dL   Hgb urine dipstick SMALL (A) NEGATIVE   Bilirubin Urine NEGATIVE NEGATIVE   Ketones, ur NEGATIVE NEGATIVE mg/dL   Protein, ur 30 (A) NEGATIVE mg/dL   Nitrite NEGATIVE NEGATIVE   Leukocytes, UA LARGE (A) NEGATIVE   RBC / HPF TOO NUMEROUS TO COUNT 0 - 5 RBC/hpf   WBC, UA TOO NUMEROUS TO COUNT 0 - 5 WBC/hpf   Bacteria, UA RARE (A) NONE SEEN   Squamous Epithelial / LPF 0-5 (A) NONE SEEN   WBC Clumps  PRESENT    Mucus PRESENT    Ca Oxalate Crys, UA PRESENT   Lactic acid, plasma     Status: None   Collection Time: 07/29/17 12:20 AM  Result Value Ref Range   Lactic Acid, Venous 1.2 0.5 - 1.9 mmol/L  Blood culture (routine x 2)     Status: None (Preliminary result)   Collection Time: 07/29/17  1:18 AM  Result Value Ref Range   Specimen Description BLOOD RIGHT AC    Special Requests      BOTTLES DRAWN AEROBIC AND ANAEROBIC Blood Culture adequate volume   Culture NO GROWTH < 12 HOURS    Report Status PENDING   Blood culture (routine x 2)     Status: None (Preliminary result)   Collection Time: 07/29/17  1:18 AM  Result Value Ref Range   Specimen Description BLOOD LEFT FA    Special Requests      BOTTLES DRAWN AEROBIC AND ANAEROBIC Blood Culture adequate volume   Culture NO GROWTH < 12 HOURS    Report Status PENDING   Comprehensive metabolic panel     Status: Abnormal   Collection Time: 07/29/17  4:03 AM  Result Value Ref Range   Sodium 139 135 - 145 mmol/L   Potassium 3.5 3.5 - 5.1 mmol/L   Chloride 105 101 - 111 mmol/L   CO2 25 22 - 32 mmol/L   Glucose, Bld 136 (H) 65 - 99 mg/dL   BUN 20 6 - 20 mg/dL   Creatinine, Ser 0.99 0.61 - 1.24 mg/dL   Calcium 8.5 (L) 8.9 - 10.3 mg/dL   Total Protein 6.9 6.5 - 8.1 g/dL   Albumin 3.7 3.5 - 5.0 g/dL   AST 21 15 - 41 U/L   ALT 19 17 - 63 U/L   Alkaline Phosphatase 61 38 - 126 U/L   Total Bilirubin 0.9 0.3 - 1.2 mg/dL   GFR calc non Af Amer >60 >60 mL/min   GFR calc Af Amer >60 >60 mL/min    Comment: (NOTE) The eGFR has been calculated using the CKD EPI equation. This calculation has not been validated in all clinical situations. eGFR's persistently <60 mL/min signify possible Chronic Kidney Disease.    Anion gap 9 5 -   15  Magnesium     Status: None   Collection Time: 07/29/17  4:03 AM  Result Value Ref Range   Magnesium 1.7 1.7 - 2.4 mg/dL  Phosphorus     Status: None   Collection Time: 07/29/17  4:03 AM  Result Value Ref  Range   Phosphorus 2.6 2.5 - 4.6 mg/dL  CBC     Status: Abnormal   Collection Time: 07/29/17  4:03 AM  Result Value Ref Range   WBC 12.4 (H) 3.8 - 10.6 K/uL   RBC 5.27 4.40 - 5.90 MIL/uL   Hemoglobin 15.6 13.0 - 18.0 g/dL   HCT 46.5 40.0 - 52.0 %   MCV 88.2 80.0 - 100.0 fL   MCH 29.6 26.0 - 34.0 pg   MCHC 33.6 32.0 - 36.0 g/dL   RDW 14.3 11.5 - 14.5 %   Platelets 217 150 - 440 K/uL    Ct Abdomen Pelvis W Contrast  Result Date: 07/29/2017 CLINICAL DATA:  Abdominal pain and distension.  Nausea. EXAM: CT ABDOMEN AND PELVIS WITH CONTRAST TECHNIQUE: Multidetector CT imaging of the abdomen and pelvis was performed using the standard protocol following bolus administration of intravenous contrast. CONTRAST:  141m ISOVUE-300 IOPAMIDOL (ISOVUE-300) INJECTION 61% COMPARISON:  CT 08/20/2016 FINDINGS: Lower chest: Linear atelectasis in the lung bases. No pleural fluid. Hiatal or paraesophageal hernia with fluid distending the herniated stomach. Hepatobiliary: No focal hepatic lesion. Clips in the gallbladder fossa postcholecystectomy. No biliary dilatation. Pancreas: No ductal dilatation or inflammation. Spleen: Normal in size without focal abnormality. Adrenals/Urinary Tract: No adrenal nodule. No hydronephrosis. Homogeneous renal enhancement and symmetric excretion on delayed phase imaging. 6 cm cyst in the mid upper left kidney. Subcentimeter hypodensity in the mid right kidney is too small to characterize. Urinary bladder is partially distended. No bladder wall thickening. Small left bladder diverticulum without inflammation. Stomach/Bowel: Distended fluid-filled stomach including hiatal or paraesophageal hernia. Dilated fluid-filled proximal small bowel. Transition to nondilated small bowel in the central abdomen, were there is fecalization of bowel contents and small bowel wall thickening, image 25 series 2. Moderate leg small bowel thickening distal to the transition point fecalized small bowel  contents. No perienteric inflammation at the transition point. Minimal mesenteric edema about dilated fluid-filled proximal small bowel. No pneumatosis. Distal most small bowel is nondistended. Normal appendix. Small amount of liquid and solid stool in the colon without colonic inflammation. Scattered diverticulosis. There is sigmoid colonic tortuosity. Vascular/Lymphatic: Aortic atherosclerosis without aneurysm. No enlarged abdominal or pelvic lymph nodes. Reproductive: Prominent prostate gland with coarse calcifications. Other: Small amount of mesenteric edema and free fluid tracking into the pelvis. No free air. No intra-abdominal abscess. Musculoskeletal: There are no acute or suspicious osseous abnormalities. IMPRESSION: 1. Early partial small bowel obstruction with transition point in the central abdomen. Small bowel thickening of a moderate length at the transition with fecalized small bowel contents. Findings may be due to small bowel stricture or adhesions. 2. Mild colonic diverticulosis.  Colonic tortuosity. 3.  Aortic Atherosclerosis (ICD10-I70.0). 4. Additional stable chronic findings as described. Electronically Signed   By: MJeb LeveringM.D.   On: 07/29/2017 01:31   Dg Abd Portable 1v-small Bowel Obstruction Protocol-initial, 8 Hr Delay  Result Date: 07/29/2017 CLINICAL DATA:  71year old male admitted with small bowel obstruction. Multiple prior abdominal surgeries. EXAM: PORTABLE ABDOMEN - 1 VIEW COMPARISON:  CT Abdomen and Pelvis 0101 hours today. FINDINGS: Supine views of the abdomen and pelvis. Stable cholecystectomy clips. An enteric tube appears to be looped in  the moderate size gastric hiatal hernia currently (arrow). Excreted IV contrast in the urinary bladder. Dystrophic prostate calcifications and pelvic vascular calcifications again noted. The bowel gas pattern has mildly improved from earlier today in the form of increased right and transverse colonic gas. Persistent gas-filled  mid abdominal small bowel loops measuring up to 44 mm diameter. Degenerative changes in the spine. No acute osseous abnormality identified. IMPRESSION: 1. Enteric tube looped within the moderate size gastric hiatal hernia. 2. Improved bowel gas pattern since 0101 hours today, with increased gas in the ascending and transverse colon. But there is persistent gas-filled mildly dilated mid abdominal small bowel up to 44 mm diameter. Electronically Signed   By: H  Hall M.D.   On: 07/29/2017 14:59   Dg Abd Portable 1 View  Result Date: 07/29/2017 CLINICAL DATA:  Nasogastric tube placement EXAM: PORTABLE ABDOMEN - 1 VIEW COMPARISON:  CT abdomen and pelvis July 29, 2017 FINDINGS: The nasogastric tube tip is at the gastroesophageal junction. There is no appreciable bowel dilatation in the upper abdomen. No free air. There is atelectatic change in the left base. There is a hiatal hernia. IMPRESSION: Nasogastric tube tip is at the level of the gastric cardia, distal to a focal hiatal hernia. Advise advancing nasogastric tube approximately the 10 12 cm. Visualized bowel gas pattern appears unremarkable. Electronically Signed   By: William  Woodruff III M.D.   On: 07/29/2017 02:52    ROS:  CONSTITUTIONAL: Denies fevers, chills. Denies any fatigue, weakness.  EYES: Denies blurry vision, double vision, eye pain. EARS, NOSE, THROAT: Denies tinnitus, ear pain, hearing loss. RESPIRATORY: Denies cough, wheeze, shortness of breath.  CARDIOVASCULAR: Denies chest pain, palpitations, edema.  GASTROINTESTINAL: Denies nausea, vomiting, diarrhea, abdominal pain. Denies bright red blood per rectum. GENITOURINARY: Denies dysuria, hematuria. ENDOCRINE: Denies nocturia or thyroid problems. HEMATOLOGIC AND LYMPHATIC: Denies easy bruising or bleeding. SKIN: Denies rash or lesion. MUSCULOSKELETAL: Denies pain in neck, back, shoulder, knees, hips or arthritic symptoms.  NEUROLOGIC: Denies paralysis, paresthesias.   PSYCHIATRIC: Denies anxiety or depressive symptoms. Blood pressure (!) 156/84, pulse 75, temperature 97.7 F (36.5 C), temperature source Oral, resp. rate 16, height 6' (1.829 m), weight 98.9 kg (218 lb), SpO2 95 %.   PHYSICAL EXAMINATION:  GENERAL: Well-nourished, well-developed currently in no acute distress.  HEAD: Normocephalic, atraumatic.  EYES: Pupils equal, round, and reactive to light. Extraocular muscles intact. No scleral icterus.  MOUTH: Moist mucosal membranes. Dentition intact. No abscess noted. EARS, NOSE, THROAT: Clear without exudates. No external lesions.  NECK: Supple. No thyromegaly. No nodules. No JVD.  PULMONARY: Clear to auscultation bilaterally without wheezes, rales, or rhonchi. No use of accessory muscles. Good respiratory effort. CHEST: Nontender to palpation.  CARDIOVASCULAR: S1, S2, regular rate and rhythm. No murmurs, rubs, or gallops.  GASTROINTESTINAL: Soft, nontender, nondistended. No masses. Positive bowel sounds. No hepatosplenomegaly. MUSCULOSKELETAL: No swelling, clubbing, edema. Range of motion full in all extremities. NEUROLOGIC: Cranial nerves II-XII intact. No gross focal neurological deficits. Sensation intact. Reflexes intact. SKIN: No ulcerations, lesions, rash, cyanosis. Skin warm, dry. Turgor intact. PSYCHIATRIC: Mood, affect within normal limits. Patient awake, alert, oriented x 3. Insight and judgment intact.   Assessment/Plan:  #Hypertension blood pressure elevated Currently patient is nothing by mouth Will provide IV Lopressor and IV hydralazine as needed basis, RN to administer antihypertensives via IV as needed basis, discussed with the nurse Adequate pain control Monitor blood pressure and titrate the medications  #GERD-Protonix  #Benign prostate hypertension Home medications are on hold  #  Small bowel obstruction Currently nothing by mouth Management per surgery  Thank you Dr. Adonis Huguenin for consulting hospitalist team for  management of the blood pressure  TOTAL TIME TAKING CARE OF THIS PATIENT: 42  minutes.   Note: This dictation was prepared with Dragon dictation along with smaller phrase technology. Any transcriptional errors that result from this process are unintentional.   _0 @ Pager - [phone] 07/29/2017, 3:33 PM

## 2017-07-29 NOTE — ED Notes (Signed)
Surgeon at bedside.  

## 2017-07-29 NOTE — ED Notes (Signed)
Pt still nauseated and vomiting,Dr. Marena ChancyWebber was notified.

## 2017-07-29 NOTE — ED Notes (Signed)
Pt cont to have vomiting and diarrhea; pt assisted out of restroom and onto w/c; taken immed to room 12, placed in hosp gown for further evaluation

## 2017-07-29 NOTE — ED Notes (Signed)
Nurse assisted pt with urinal

## 2017-07-29 NOTE — Progress Notes (Signed)
07/29/2017  Subjective: Patient admitted overnight with small bowel obstruction.  He is a patient of Dr. Michaelle CopasSmith's and he's operated on the patient multiple times, with last surgery done for a prior episode of small bowel obstruction.  This morning, patient reports feeling better with no nausea and much improved abdominal pain.  No flatus yet.  Vital signs: Temp:  [97.6 F (36.4 C)-99.1 F (37.3 C)] 98.2 F (36.8 C) (12/19 0348) Pulse Rate:  [94-102] 102 (12/19 0348) Resp:  [18-20] 18 (12/19 0140) BP: (157-180)/(95-111) 157/95 (12/19 0348) SpO2:  [91 %-95 %] 95 % (12/19 0348) Weight:  [98.9 kg (218 lb)-104.3 kg (230 lb)] 98.9 kg (218 lb) (12/19 0440)   Intake/Output: 12/18 0701 - 12/19 0700 In: 1222 [I.V.:222; IV Piggyback:1000] Out: 200 [Emesis/NG output:200] Last BM Date: 07/28/17  Physical Exam: Constitutional: No acute distress Abdomen:  Soft, distended, nontender to palpation.  NG tube in place with dark gastric contents in canister.  Labs:  Recent Labs    07/28/17 2146 07/29/17 0403  WBC 12.0* 12.4*  HGB 16.3 15.6  HCT 48.1 46.5  PLT 226 217   Recent Labs    07/28/17 2146 07/29/17 0403  NA 137 139  K 3.5 3.5  CL 103 105  CO2 25 25  GLUCOSE 128* 136*  BUN 24* 20  CREATININE 1.22 0.99  CALCIUM 9.3 8.5*   No results for input(s): LABPROT, INR in the last 72 hours.  Imaging: Ct Abdomen Pelvis W Contrast  Result Date: 07/29/2017 CLINICAL DATA:  Abdominal pain and distension.  Nausea. EXAM: CT ABDOMEN AND PELVIS WITH CONTRAST TECHNIQUE: Multidetector CT imaging of the abdomen and pelvis was performed using the standard protocol following bolus administration of intravenous contrast. CONTRAST:  100mL ISOVUE-300 IOPAMIDOL (ISOVUE-300) INJECTION 61% COMPARISON:  CT 08/20/2016 FINDINGS: Lower chest: Linear atelectasis in the lung bases. No pleural fluid. Hiatal or paraesophageal hernia with fluid distending the herniated stomach. Hepatobiliary: No focal hepatic  lesion. Clips in the gallbladder fossa postcholecystectomy. No biliary dilatation. Pancreas: No ductal dilatation or inflammation. Spleen: Normal in size without focal abnormality. Adrenals/Urinary Tract: No adrenal nodule. No hydronephrosis. Homogeneous renal enhancement and symmetric excretion on delayed phase imaging. 6 cm cyst in the mid upper left kidney. Subcentimeter hypodensity in the mid right kidney is too small to characterize. Urinary bladder is partially distended. No bladder wall thickening. Small left bladder diverticulum without inflammation. Stomach/Bowel: Distended fluid-filled stomach including hiatal or paraesophageal hernia. Dilated fluid-filled proximal small bowel. Transition to nondilated small bowel in the central abdomen, were there is fecalization of bowel contents and small bowel wall thickening, image 25 series 2. Moderate leg small bowel thickening distal to the transition point fecalized small bowel contents. No perienteric inflammation at the transition point. Minimal mesenteric edema about dilated fluid-filled proximal small bowel. No pneumatosis. Distal most small bowel is nondistended. Normal appendix. Small amount of liquid and solid stool in the colon without colonic inflammation. Scattered diverticulosis. There is sigmoid colonic tortuosity. Vascular/Lymphatic: Aortic atherosclerosis without aneurysm. No enlarged abdominal or pelvic lymph nodes. Reproductive: Prominent prostate gland with coarse calcifications. Other: Small amount of mesenteric edema and free fluid tracking into the pelvis. No free air. No intra-abdominal abscess. Musculoskeletal: There are no acute or suspicious osseous abnormalities. IMPRESSION: 1. Early partial small bowel obstruction with transition point in the central abdomen. Small bowel thickening of a moderate length at the transition with fecalized small bowel contents. Findings may be due to small bowel stricture or adhesions. 2. Mild colonic  diverticulosis.  Colonic tortuosity. 3.  Aortic Atherosclerosis (ICD10-I70.0). 4. Additional stable chronic findings as described. Electronically Signed   By: Rubye OaksMelanie  Ehinger M.D.   On: 07/29/2017 01:31   Dg Abd Portable 1 View  Result Date: 07/29/2017 CLINICAL DATA:  Nasogastric tube placement EXAM: PORTABLE ABDOMEN - 1 VIEW COMPARISON:  CT abdomen and pelvis July 29, 2017 FINDINGS: The nasogastric tube tip is at the gastroesophageal junction. There is no appreciable bowel dilatation in the upper abdomen. No free air. There is atelectatic change in the left base. There is a hiatal hernia. IMPRESSION: Nasogastric tube tip is at the level of the gastric cardia, distal to a focal hiatal hernia. Advise advancing nasogastric tube approximately the 10 12 cm. Visualized bowel gas pattern appears unremarkable. Electronically Signed   By: Bretta BangWilliam  Woodruff III M.D.   On: 07/29/2017 02:52    Assessment/Plan: 71 yo male with SBO  --continue NPO with IV fluid hydration. --continue NG tube to suction.  Patient has follow up KUB this afternoon ordered to evaluate progression. --Will discuss with Dr. Michaelle CopasSmith's partner, Dr. Hazle Quantintron-Diaz for further care.   Keith IllJose Luis Casanova Schurman, MD [facility]

## 2017-07-29 NOTE — ED Notes (Signed)
Nurse spoke with Misty StanleyLisa, RN. Pt is in waiting room bathroom vomiting and having diarrhea per nurse.

## 2017-07-29 NOTE — ED Provider Notes (Signed)
[facility] Emergency Department Provider Note   ____________________________________________   First MD Initiated Contact with Patient 07/28/17 2345     (approximate)  I have reviewed the triage vital signs and the nursing notes.   HISTORY  Chief Complaint Abdominal Pain    HPI Keith Craig is a 71 y.o. male who comes into the hospital today with some severe lower abdominal pain.  He states that it started up again around 630.  He states that he got sick yesterday night right before midnight.  He had had pain earlier in the day but the pain went away this morning around 8 AM.  He didn't eat much all day but reports that after he ate chicken strips the abdominal pain came back.  He reports that his abdomen felt tight and distended.  He reports that he started vomiting in triage.  He also had diarrhea.  He states that his emesis was brownish and his diarrhea was like yellow and watery.  He states that his abdominal pain at this time is gone and his distention is improved.  The patient denies any fevers, chest pain, lightheadedness or dizziness.  He came in tonight for evaluation of his symptoms.   Past Medical History:  Diagnosis Date  . Arthritis   . Benign essential microscopic hematuria   . BPH with obstruction/lower urinary tract symptoms   . Chills   . Chronic airway obstruction (HCC)   . Chronic obstructive asthma (HCC)   . Esophageal reflux   . HTN (hypertension)   . Over weight     Patient Active Problem List   Diagnosis Date Noted  . Nocturia 01/03/2016  . 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    Past Surgical History:  Procedure Laterality Date  . cataract surgery Bilateral   . CHOLECYSTECTOMY    . HIATAL HERNIA REPAIR    . microvascular decompression of left trigeminal nerve    . RHIZOTOMY    . small bowel restrictions    . URETHRAL STRICTURE DILATATION      Prior to Admission medications   Medication  Sig Start Date End Date Taking? Authorizing Provider  ADVAIR DISKUS 250-50 MCG/DOSE AEPB INHALE 1 PUFF BY MOUTH EVERY 12 HOURS 01/26/15   [provider]  albuterol (PROVENTIL HFA;VENTOLIN HFA) 108 (90 BASE) MCG/ACT inhaler Inhale 2 puffs into the lungs every 6 (six) hours as needed for wheezing or shortness of breath.    [provider]  cloNIDine (CATAPRES) 0.2 MG tablet Take 0.2 mg by mouth 2 (two) times daily.    [provider]  doxycycline (VIBRAMYCIN) 100 MG capsule Reported on 12/31/2015 11/09/15   [provider]  finasteride (PROSCAR) 5 MG tablet Take 1 tablet (5 mg total) by mouth daily. 01/06/17   Michiel Cowboy A, PA-C  finasteride (PROSCAR) 5 MG tablet TAKE 1 TABLET (5 MG TOTAL) BY MOUTH DAILY. 01/08/17   Michiel Cowboy A, PA-C  fluticasone (FLONASE) 50 MCG/ACT nasal spray Place into the nose.    [provider]  levofloxacin (LEVAQUIN) 500 MG tablet Reported on 12/31/2015 10/15/15   [provider]  metoprolol tartrate (LOPRESSOR) 25 MG tablet Take by mouth. 07/20/14   [provider]  montelukast (SINGULAIR) 4 MG chewable tablet Chew 4 mg by mouth at bedtime.    [provider]  pantoprazole (PROTONIX) 20 MG tablet  09/20/15   [provider]  predniSONE (DELTASONE) 10 MG tablet  10/08/15   [provider]  tamsulosin (FLOMAX) 0.4 MG CAPS capsule TAKE ONE CAPSULE BY MOUTH DAILY 01/26/17   Marvel PlanMcGowan, Carollee HerterShannon A, PA-C  valsartan (DIOVAN) 160 MG tablet Take 160 mg by mouth daily.    [provider]  vitamin B-12 (CYANOCOBALAMIN) 1000 MCG tablet Take by mouth.    [provider]    Allergies Vitamin b12 [cyanocobalamin]  Family History  Problem Relation Age of Onset  . Arthritis/Rheumatoid Father   . Kidney disease Neg Hx   . Prostate cancer Neg Hx   . Kidney cancer Neg Hx   . Bladder Cancer Neg Hx     Social History Social History   Tobacco Use  . Smoking status: Former  Smoker    Last attempt to quit: 1971    Years since quitting: 47.9  . Smokeless tobacco: Never Used  Substance Use Topics  . Alcohol use: No    Alcohol/week: 0.0 oz  . Drug use: No    Review of Systems  Constitutional: No fever/chills Eyes: No visual changes. ENT: No sore throat. Cardiovascular: Denies chest pain. Respiratory: Denies shortness of breath. Gastrointestinal:  abdominal pain.  nausea,  Vomiting, diarrhea.  No constipation. Genitourinary: Negative for dysuria. Musculoskeletal: Negative for back pain. Skin: Negative for rash. Neurological: Negative for headaches, focal weakness or numbness.   ____________________________________________   PHYSICAL EXAM:  VITAL SIGNS: ED Triage Vitals  Enc Vitals Group     BP 07/28/17 2148 (!) 168/110     Pulse Rate 07/28/17 2148 94     Resp 07/28/17 2148 20     Temp 07/28/17 2148 97.6 F (36.4 C)     Temp Source 07/28/17 2148 Oral     SpO2 07/28/17 2148 95 %     Weight 07/28/17 2149 230 lb (104.3 kg)     Height 07/28/17 2149 6' (1.829 m)     Head Circumference --      Peak Flow --      Pain Score --      Pain Loc --      Pain Edu? --      Excl. in GC? --     Constitutional: Alert and oriented. Well appearing and in moderate distress. Eyes: Conjunctivae are normal. PERRL. EOMI. Head: Atraumatic. Nose: No congestion/rhinnorhea. Mouth/Throat: Mucous membranes are moist.  Oropharynx non-erythematous. Cardiovascular: Normal rate, regular rhythm. Grossly normal heart sounds.  Good peripheral circulation. Respiratory: Normal respiratory effort.  No retractions. Lungs CTAB. Gastrointestinal: Soft and nontender. No distention.  Positive bowel sounds Musculoskeletal: No lower extremity tenderness nor edema.   Neurologic:  Normal speech and language.  Skin:  Skin is warm, dry and intact.  Psychiatric: Mood and affect are normal.   ____________________________________________   LABS (all labs ordered are listed, but  only abnormal results are displayed)  Labs Reviewed  COMPREHENSIVE METABOLIC PANEL - Abnormal; Notable for the following components:      Result Value   Glucose, Bld 128 (*)    BUN 24 (*)    GFR calc non Af Amer 58 (*)    All other components within normal limits  CBC - Abnormal; Notable for the following components:   WBC 12.0 (*)    All other components within normal limits  URINALYSIS, COMPLETE (UACMP) WITH MICROSCOPIC - Abnormal; Notable for the following components:   Color, Urine AMBER (*)    APPearance CLOUDY (*)    Specific Gravity, Urine 1.031 (*)    Hgb urine dipstick SMALL (*)    Protein, ur 30 (*)  Leukocytes, UA LARGE (*)    Bacteria, UA RARE (*)    Squamous Epithelial / LPF 0-5 (*)    All other components within normal limits  URINE CULTURE  CULTURE, BLOOD (ROUTINE X 2)  CULTURE, BLOOD (ROUTINE X 2)  LIPASE, BLOOD  LACTIC ACID, PLASMA   ____________________________________________  EKG  none ____________________________________________  RADIOLOGY  Ct Abdomen Pelvis W Contrast  Result Date: 07/29/2017 CLINICAL DATA:  Abdominal pain and distension.  Nausea. EXAM: CT ABDOMEN AND PELVIS WITH CONTRAST TECHNIQUE: Multidetector CT imaging of the abdomen and pelvis was performed using the standard protocol following bolus administration of intravenous contrast. CONTRAST:  ISOVUE-300 IOPAMIDOL (ISOVUE-300) INJECTION 61% COMPARISON:  CT 08/20/2016 FINDINGS: Lower chest: Linear atelectasis in the lung bases. No pleural fluid. Hiatal or paraesophageal hernia with fluid distending the herniated stomach. Hepatobiliary: No focal hepatic lesion. Clips in the gallbladder fossa postcholecystectomy. No biliary dilatation. Pancreas: No ductal dilatation or inflammation. Spleen: Normal in size without focal abnormality. Adrenals/Urinary Tract: No adrenal nodule. No hydronephrosis. Homogeneous renal enhancement and symmetric excretion on delayed phase imaging. 6 cm cyst in the  mid upper left kidney. Subcentimeter hypodensity in the mid right kidney is too small to characterize. Urinary bladder is partially distended. No bladder wall thickening. Small left bladder diverticulum without inflammation. Stomach/Bowel: Distended fluid-filled stomach including hiatal or paraesophageal hernia. Dilated fluid-filled proximal small bowel. Transition to nondilated small bowel in the central abdomen, were there is fecalization of bowel contents and small bowel wall thickening, image 25 series 2. Moderate leg small bowel thickening distal to the transition point fecalized small bowel contents. No perienteric inflammation at the transition point. Minimal mesenteric edema about dilated fluid-filled proximal small bowel. No pneumatosis. Distal most small bowel is nondistended. Normal appendix. Small amount of liquid and solid stool in the colon without colonic inflammation. Scattered diverticulosis. There is sigmoid colonic tortuosity. Vascular/Lymphatic: Aortic atherosclerosis without aneurysm. No enlarged abdominal or pelvic lymph nodes. Reproductive: Prominent prostate gland with coarse calcifications. Other: Small amount of mesenteric edema and free fluid tracking into the pelvis. No free air. No intra-abdominal abscess. Musculoskeletal: There are no acute or suspicious osseous abnormalities. IMPRESSION: 1. Early partial small bowel obstruction with transition point in the central abdomen. Small bowel thickening of a moderate length at the transition with fecalized small bowel contents. Findings may be due to small bowel stricture or adhesions. 2. Mild colonic diverticulosis.  Colonic tortuosity. 3.  Aortic Atherosclerosis (ICD10-I70.0). 4. Additional stable chronic findings as described. Electronically Signed   By: Rubye Oaks M.D.   On: 07/29/2017 01:31    ____________________________________________   PROCEDURES  Procedure(s) performed: None  Procedures  Critical Care performed:  No  ____________________________________________   INITIAL IMPRESSION / ASSESSMENT AND PLAN / ED COURSE  As part of my medical decision making, I reviewed the following data within the electronic MEDICAL RECORD NUMBER Notes from prior ED visits and [place] Controlled Substance Database   This is a 71 year old male who comes into the hospital today with some abdominal pain vomiting and diarrhea.  My differential diagnosis includes gastroenteritis, colitis, appendicitis, small bowel obstruction.  We did check some blood work and the patient appears to have urinary tract infection.  He has large leukocytes with too numerous to count red blood cells and white blood cells.  The patient has some rare bacteria but his urine is concentrated and cloudy.  I will send the patient for a CT scan to evaluate his abdomen given his pain vomiting and diarrhea.  I will give the patient a dose of Zofran and a liter of normal saline.  He will be reassessed once I receive the results of his CT.     Patient CT scan is significant for an early partial small bowel obstruction with a transition point in the central abdomen.  He also has some small bowel thickening at the transition point.  We will place an NG tube and I did contact surgery to admit the patient.  The patient has had surgery done by Dr. Katrinka BlazingSmith in the past.  I initially spoke to Dr. Evette CristalSankar who recommended admitting the patient to the surgicalist who is located in the hospital.  I then contacted Dr. Tonita CongWoodham and he will admit the patient to the hospital.  The patient did receive a second dose of Zofran for vomiting.  He will be admitted.  ____________________________________________   FINAL CLINICAL IMPRESSION(S) / ED DIAGNOSES  Final diagnoses:  Small bowel obstruction [facility](HCC)     ED Discharge Orders    None       Note:  This document was prepared using Dragon voice recognition software and may include unintentional dictation errors.    Rebecka ApleyWebster,  Khallid Pasillas P, MD 07/29/17 [phone]

## 2017-07-30 LAB — CBC WITH DIFFERENTIAL/PLATELET
Basophils Absolute: 0.1 10*3/uL (ref 0–0.1)
Basophils Relative: 1 %
Eosinophils Absolute: 0.4 10*3/uL (ref 0–0.7)
Eosinophils Relative: 6 %
HCT: 43 % (ref 40.0–52.0)
Hemoglobin: 14.7 g/dL (ref 13.0–18.0)
Lymphocytes Relative: 19 %
Lymphs Abs: 1.4 10*3/uL (ref 1.0–3.6)
MCH: 30.2 pg (ref 26.0–34.0)
MCHC: 34.1 g/dL (ref 32.0–36.0)
MCV: 88.5 fL (ref 80.0–100.0)
Monocytes Absolute: 0.9 10*3/uL (ref 0.2–1.0)
Monocytes Relative: 12 %
Neutro Abs: 4.8 10*3/uL (ref 1.4–6.5)
Neutrophils Relative %: 62 %
Platelets: 195 10*3/uL (ref 150–440)
RBC: 4.86 MIL/uL (ref 4.40–5.90)
RDW: 14 % (ref 11.5–14.5)
WBC: 7.6 10*3/uL (ref 3.8–10.6)

## 2017-07-30 LAB — MAGNESIUM: Magnesium: 2 mg/dL (ref 1.7–2.4)

## 2017-07-30 LAB — BASIC METABOLIC PANEL
Anion gap: 5 (ref 5–15)
BUN: 13 mg/dL (ref 6–20)
CO2: 25 mmol/L (ref 22–32)
Calcium: 8 mg/dL — ABNORMAL LOW (ref 8.9–10.3)
Chloride: 108 mmol/L (ref 101–111)
Creatinine, Ser: 1.03 mg/dL (ref 0.61–1.24)
GFR calc Af Amer: >60 mL/min (ref >60)
GFR calc non Af Amer: >60 mL/min (ref >60)
Glucose, Bld: 104 mg/dL — ABNORMAL HIGH (ref 65–99)
Potassium: 3.6 mmol/L (ref 3.5–5.1)
Sodium: 138 mmol/L (ref 135–145)

## 2017-07-30 LAB — URINE CULTURE

## 2017-07-30 LAB — TROPONIN I
Troponin I: <0.03 ng/mL (ref <0.03)
Troponin I: <0.03 ng/mL (ref <0.03)

## 2017-07-30 NOTE — Progress Notes (Signed)
[facility] [place] [facility]   PATIENT NAME: Keith Craig    MR#:  [id number]  DATE OF BIRTH:  05-15-1946  SUBJECTIVE:   Patient doing well this morning. No flatulus  No abdominal pain.  REVIEW OF SYSTEMS:     Review of Systems  Constitutional: Negative for fever, chills weight loss HENT: Negative for ear pain, nosebleeds, congestion, facial swelling, rhinorrhea, neck pain, neck stiffness and ear discharge.   Respiratory: Negative for cough, shortness of breath, wheezing  Cardiovascular: Negative for chest pain, palpitations and leg swelling.  Gastrointestinal: Negative for heartburn, abdominal pain, vomiting, diarrhea or consitpation Genitourinary: Negative for dysuria, urgency, frequency, hematuria Musculoskeletal: Negative for back pain or joint pain Neurological: Negative for dizziness, seizures, syncope, focal weakness,  numbness and headaches.  Hematological: Does not bruise/bleed easily.  Psychiatric/Behavioral: Negative for hallucinations, confusion, dysphoric mood    Tolerating Diet: Nothing by mouth      DRUG ALLERGIES:   Allergies  Allergen Reactions  . Vitamin B12 [Cyanocobalamin] Itching and Rash    VITALS:  Blood pressure (!) 171/98, pulse 73, temperature 98.3 F (36.8 C), temperature source Oral, resp. rate 16, height 6' (1.829 m), weight 98.9 kg (218 lb), SpO2 95 %.  PHYSICAL EXAMINATION:  Constitutional: Appears well-developed and well-nourished. No distress. NG tube placed HENT: Normocephalic. Marland Kitchen Oropharynx is clear and moist.  Eyes: Conjunctivae and EOM are normal. PERRLA, no scleral icterus.  Neck: Normal ROM. Neck supple. No JVD. No tracheal deviation. CVS: RRR, S1/S2 +, no murmurs, no gallops, no carotid bruit.  Pulmonary: Effort and breath sounds normal, no stridor, rhonchi, wheezes, rales.  Abdominal: Hypoactive bowel sounds some mild distention no tenderness, rebound or guarding.  Musculoskeletal: Normal range of  motion. No edema and no tenderness.  Neuro: Alert. CN 2-12 grossly intact. No focal deficits. Skin: Skin is warm and dry. No rash noted. Psychiatric: Normal mood and affect.      LABORATORY PANEL:   CBC Recent Labs  Lab 07/30/17 0541  WBC 7.6  HGB 14.7  HCT 43.0  PLT 195   ------------------------------------------------------------------------------------------------------------------  Chemistries  Recent Labs  Lab 07/29/17 0403 07/30/17 0541  NA 139 138  K 3.5 3.6  CL 105 108  CO2 25 25  GLUCOSE 136* 104*  BUN 20 13  CREATININE 0.99 1.03  CALCIUM 8.5* 8.0*  MG 1.7 2.0  AST 21  --   ALT 19  --   ALKPHOS 61  --   BILITOT 0.9  --    ------------------------------------------------------------------------------------------------------------------  Cardiac Enzymes Recent Labs  Lab 07/29/17 1810 07/29/17 2349 07/30/17 0541  TROPONINI <0.03 <0.03 <0.03   ------------------------------------------------------------------------------------------------------------------  RADIOLOGY:  Ct Abdomen Pelvis W Contrast  Result Date: 07/29/2017 CLINICAL DATA:  Abdominal pain and distension.  Nausea. EXAM: CT ABDOMEN AND PELVIS WITH CONTRAST TECHNIQUE: Multidetector CT imaging of the abdomen and pelvis was performed using the standard protocol following bolus administration of intravenous contrast. CONTRAST:  ISOVUE-300 IOPAMIDOL (ISOVUE-300) INJECTION 61% COMPARISON:  CT 08/20/2016 FINDINGS: Lower chest: Linear atelectasis in the lung bases. No pleural fluid. Hiatal or paraesophageal hernia with fluid distending the herniated stomach. Hepatobiliary: No focal hepatic lesion. Clips in the gallbladder fossa postcholecystectomy. No biliary dilatation. Pancreas: No ductal dilatation or inflammation. Spleen: Normal in size without focal abnormality. Adrenals/Urinary Tract: No adrenal nodule. No hydronephrosis. Homogeneous renal enhancement and symmetric excretion on delayed  phase imaging. 6 cm cyst in the mid upper left kidney. Subcentimeter hypodensity in the mid right kidney is too  small to characterize. Urinary bladder is partially distended. No bladder wall thickening. Small left bladder diverticulum without inflammation. Stomach/Bowel: Distended fluid-filled stomach including hiatal or paraesophageal hernia. Dilated fluid-filled proximal small bowel. Transition to nondilated small bowel in the central abdomen, were there is fecalization of bowel contents and small bowel wall thickening, image 25 series 2. Moderate leg small bowel thickening distal to the transition point fecalized small bowel contents. No perienteric inflammation at the transition point. Minimal mesenteric edema about dilated fluid-filled proximal small bowel. No pneumatosis. Distal most small bowel is nondistended. Normal appendix. Small amount of liquid and solid stool in the colon without colonic inflammation. Scattered diverticulosis. There is sigmoid colonic tortuosity. Vascular/Lymphatic: Aortic atherosclerosis without aneurysm. No enlarged abdominal or pelvic lymph nodes. Reproductive: Prominent prostate gland with coarse calcifications. Other: Small amount of mesenteric edema and free fluid tracking into the pelvis. No free air. No intra-abdominal abscess. Musculoskeletal: There are no acute or suspicious osseous abnormalities. IMPRESSION: 1. Early partial small bowel obstruction with transition point in the central abdomen. Small bowel thickening of a moderate length at the transition with fecalized small bowel contents. Findings may be due to small bowel stricture or adhesions. 2. Mild colonic diverticulosis.  Colonic tortuosity. 3.  Aortic Atherosclerosis (ICD10-I70.0). 4. Additional stable chronic findings as described. Electronically Signed   By: Rubye OaksMelanie  Ehinger M.D.   On: 07/29/2017 01:31   Dg Abd 2 Views  Result Date: 07/30/2017 CLINICAL DATA:  Recent bowel obstruction EXAM: ABDOMEN - 2 VIEW  COMPARISON:  Study obtained earlier in the day FINDINGS: Supine and upright images obtained. Nasogastric tube tip and side port are in the stomach. Contrast is now all seen throughout the colon. There are several loops of mildly dilated small bowel without appreciable air-fluid levels. No free air. There is bibasilar atelectasis. There are prostatic calculi. IMPRESSION: Findings felt to be indicative of resolving bowel obstruction. Contrast is seen throughout the colon. Nasogastric tube tip and side port in proximal stomach. No free air evident. Prostatic calculi noted. Electronically Signed   By: Bretta BangWilliam  Woodruff III M.D.   On: 07/30/2017 00:49   Dg Abd Portable 1v-small Bowel Obstruction Protocol-initial, 8 Hr Delay  Result Date: 07/29/2017 CLINICAL DATA:  71 year old male admitted with small bowel obstruction. Multiple prior abdominal surgeries. EXAM: PORTABLE ABDOMEN - 1 VIEW COMPARISON:  CT Abdomen and Pelvis 0101 hours today. FINDINGS: Supine views of the abdomen and pelvis. Stable cholecystectomy clips. An enteric tube appears to be looped in the moderate size gastric hiatal hernia currently (arrow). Excreted IV contrast in the urinary bladder. Dystrophic prostate calcifications and pelvic vascular calcifications again noted. The bowel gas pattern has mildly improved from earlier today in the form of increased right and transverse colonic gas. Persistent gas-filled mid abdominal small bowel loops measuring up to 44 mm diameter. Degenerative changes in the spine. No acute osseous abnormality identified. IMPRESSION: 1. Enteric tube looped within the moderate size gastric hiatal hernia. 2. Improved bowel gas pattern since 0101 hours today, with increased gas in the ascending and transverse colon. But there is persistent gas-filled mildly dilated mid abdominal small bowel up to 44 mm diameter. Electronically Signed   By: Odessa FlemingH  Hall M.D.   On: 07/29/2017 14:59   Dg Abd Portable 1 View  Result Date:  07/29/2017 CLINICAL DATA:  Nasogastric tube placement EXAM: PORTABLE ABDOMEN - 1 VIEW COMPARISON:  CT abdomen and pelvis July 29, 2017 FINDINGS: The nasogastric tube tip is at the gastroesophageal junction. There is no appreciable bowel  dilatation in the upper abdomen. No free air. There is atelectatic change in the left base. There is a hiatal hernia. IMPRESSION: Nasogastric tube tip is at the level of the gastric cardia, distal to a focal hiatal hernia. Advise advancing nasogastric tube approximately the 10 12 cm. Visualized bowel gas pattern appears unremarkable. Electronically Signed   By: Bretta BangWilliam  Woodruff III M.D.   On: 07/29/2017 02:52     ASSESSMENT AND PLAN:   71 year old male with history of chronic obstructive asthma, BPH and essential hypertension who presented with Abdominal pain.  1. Essential hypertension: Patient is currently nothing by mouth and therefore on IV metoprolol and hydralazine. Patient needs to restart clonidine and Diovan when he is able to take in by mouth medications. He is at risk of rebound hypertension if he misses too many doses of clonidine.  2. BPH: Continue finasteride and Flomax  3. Mild intermittent chronic asthma without signs of exacerbation: Continue inhalers  4. GERD: Restart PPI when able to take in oral medications 5. Small bowel obstruction: Management as per surgery   Management plan discussed with the patient and he is in agreement.  CODE STATUS: full  TOTAL TIME TAKING CARE OF THIS PATIENT: 30 minutes.    I will sign off for now please call me if they have any questions.   POSSIBLE D/C 1-2 days, DEPENDING ON CLINICAL CONDITION.   Usiel Astarita M.D on 07/30/2017 at 8:33 AM  Between 7am to 6pm - Pager - [phone] After 6pm go to [email or web address] - password [email or web address] [email or web address] [email or web address]  [facility]  Office  [phone]  CC: Primary care physician; Marguarite ArbourSparks, Jeffrey D, MD  Note: This dictation was prepared with Dragon dictation  along with smaller phrase technology. Any transcriptional errors that result from this process are unintentional.

## 2017-07-30 NOTE — Progress Notes (Signed)
Patient ID: Jess BartersJames H Lasure, male   DOB: 09-06-1945, 71 y.o.   MRN: [id number]       SURGICAL PROGRESS NOTE   Hospital Day(s): 1.   Post op day(s):  Marland Kitchen.   Interval History: Patient seen and examined, no acute events or new complaints overnight. Patient reports feeling same as yesterday. No increased pain. Denies nausea and vomiting. Denies chest pain or shortness of breath.  Vital signs in last 24 hours: [min-max] current  Temp:  [97.7 F (36.5 C)-98.8 F (37.1 C)] 98.3 F (36.8 C) (12/20 0754) Pulse Rate:  [68-86] 73 (12/20 0754) Resp:  [16-20] 16 (12/20 0754) BP: (153-171)/(84-102) 171/98 (12/20 0754) SpO2:  [90 %-95 %] 95 % (12/20 0754)     Height: 6' (182.9 cm) Weight: 98.9 kg (218 lb) BMI (Calculated): 29.56   NGT output In 24 hours:  250mL  Physical Exam:  Constitutional: alert, cooperative and no distress  Respiratory: breathing non-labored at rest  Cardiovascular: regular rate and sinus rhythm  Gastrointestinal: soft and depressible, non-tender, and mild-distended  Labs:  CBC Latest Ref Rng & Units 07/30/2017 07/29/2017 07/28/2017  WBC 3.8 - 10.6 K/uL 7.6 12.4(H) 12.0(H)  Hemoglobin 13.0 - 18.0 g/dL 40.914.7 81.115.6 91.416.3  Hematocrit 40.0 - 52.0 % 43.0 46.5 48.1  Platelets 150 - 440 K/uL 195 217 226   CMP Latest Ref Rng & Units 07/30/2017 07/29/2017 07/28/2017  Glucose 65 - 99 mg/dL 782(N104(H) 562(Z136(H) 308(M128(H)  BUN 6 - 20 mg/dL 13 20 57(Q24(H)  Creatinine 0.61 - 1.24 mg/dL 4.691.03 6.290.99 5.281.22  Sodium 135 - 145 mmol/L 138 139 137  Potassium 3.5 - 5.1 mmol/L 3.6 3.5 3.5  Chloride 101 - 111 mmol/L 108 105 103  CO2 22 - 32 mmol/L 25 25 25   Calcium 8.9 - 10.3 mg/dL 8.0(L) 8.5(L) 9.3  Total Protein 6.5 - 8.1 g/dL - 6.9 7.4  Total Bilirubin 0.3 - 1.2 mg/dL - 0.9 1.0  Alkaline Phos 38 - 126 U/L - 61 64  AST 15 - 41 U/L - 21 24  ALT 17 - 63 U/L - 19 20   Imaging studies: Abdominal xrays 2 views personally evaluated and found with oral contras in colon. There is also air in colon. Decreased size  of small bowel.   Assessment/Plan:  71 y.o. male with small bowel obstruction most likely due to adhesions from previous surgery. The patient without deterioration of clinical status. Still not passing flatus. Will keep NGT to suction. Abd xray with oral contras seen with contrast on large intestine which is a good sign. Will continue with conservative management: IV hydration, bowel rest, NGT. Antibiotic given for UTI. WBC improved, adequate hemoglobin level. No significant electrolyte abnormality today.   Carolan ShiverEdgardo Cintron-Diaz, MD

## 2017-07-31 MED ORDER — CLONIDINE HCL 0.1 MG PO TABS
0.2000 mg | ORAL_TABLET | Freq: Two times a day (BID) | ORAL | Status: DC
  Administered 2017-07-31 – 2017-08-06 (×13): 0.2 mg via ORAL
  Filled 2017-07-31 (×14): qty 2

## 2017-07-31 MED ORDER — PHENOL 1.4 % MT LIQD
1.0000 | OROMUCOSAL | Status: DC | PRN
Start: 2017-07-31 — End: 2017-08-06
  Administered 2017-07-31: 1 via OROMUCOSAL
  Filled 2017-07-31 (×3): qty 177

## 2017-07-31 NOTE — Progress Notes (Signed)
Patient ID: Keith Craig, male   [id number] spiratory: breathing non-labored at rest  Cardiovascular: regular rate and sinus rhythm  Gastrointestinal: soft and depressible, non-tender, and non-distended  Labs:  CBC Latest Ref Rng & Units 07/30/2017 07/29/2017 07/28/2017  WBC 3.8 - 10.6 K/uL 7.6 12.4(H) 12.0(H)  Hemoglobin 13.0 - 18.0 g/dL 14.7 15.6 16.3  Hematocrit 40.0 - 52.0 % 43.0 46.5 48.1  Platelets 150 - 440 K/uL 195 217 226   CMP Latest Ref Rng & Units 07/30/2017 07/29/2017 07/28/2017  Glucose 65 - 99 mg/dL 104(H) 136(H) 128(H)  BUN 6 - 20 mg/dL 13 20 24(H)  Creatinine 0.61 - 1.24 mg/dL 1.03 0.99 1.22  Sodium 135 - 145 mmol/L 138 139 137  Potassium 3.5 - 5.1 mmol/L 3.6 3.5 3.5  Chloride 101 - 111 mmol/L 108 105 103  CO2 22 - 32 mmol/L 25 25 25   Calcium 8.9 - 10.3 mg/dL 8.0(L) 8.5(L) 9.3  Total Protein 6.5 - 8.1 g/dL - 6.9 7.4  Total Bilirubin 0.3 - 1.2 mg/dL - 0.9 1.0  Alkaline Phos 38 - 126 U/L - 61 64  AST 15 - 41 U/L - 21 24  ALT 17 - 63 U/L - 19 20   Imaging studies: No new pertinent imaging studies   Assessment/Plan: 71 y.o. male with small bowel obstruction most likely due to adhesions from previous abdominal  surgery. Patient had a bowel movement last night and is passing flatus. Patient is anxious about removing the NGT since it was difficult to place. Currently is tolerating NGT clamp without nausea and vomiting. Will start clear liquid with tube in and if tolerates will remove NGT in the morning.   Jeneva Schweizer Cintrn-Daz, MD

## 2017-08-01 MED ORDER — MENTHOL 3 MG MT LOZG
1.0000 | LOZENGE | OROMUCOSAL | Status: DC | PRN
  Filled 2017-08-01: qty 9

## 2017-08-01 NOTE — Progress Notes (Signed)
Patient ID: Keith BartersJames H Craig, male   DOB: 05/05/1946, 71 y.o.   MRN: [id number] Patient with no nausea or vomiting since NG tube was clamped. He had a bowel movement yesterday and one this morning. Labs are okay and vital signs are stable Abdomen is still mildly distended but soft moderate bowel sounds noted. Since he has had no nausea and vomiting 24 hours since the NG tube was clamped we will remove the tube today. He is to stay on clear liquids. Reevaluate tomorrow morning including follow-up abdominal x-ray. He is aware that if there is any recurrence of obstructive symptoms likely he will need surgical exploration

## 2017-08-02 ENCOUNTER — Inpatient Hospital Stay: Payer: Medicare Other

## 2017-08-02 MED ORDER — TAMSULOSIN HCL 0.4 MG PO CAPS
0.4000 mg | ORAL_CAPSULE | Freq: Every day | ORAL | Status: DC
  Administered 2017-08-02 – 2017-08-06 (×5): 0.4 mg via ORAL
  Filled 2017-08-02 (×5): qty 1

## 2017-08-02 MED ORDER — FINASTERIDE 5 MG PO TABS
5.0000 mg | ORAL_TABLET | Freq: Every day | ORAL | Status: DC
  Administered 2017-08-02 – 2017-08-06 (×5): 5 mg via ORAL
  Filled 2017-08-02 (×5): qty 1

## 2017-08-02 MED ORDER — IRBESARTAN 150 MG PO TABS
75.0000 mg | ORAL_TABLET | Freq: Every day | ORAL | Status: DC
  Administered 2017-08-02 – 2017-08-06 (×5): 75 mg via ORAL
  Filled 2017-08-02 (×5): qty 1

## 2017-08-02 MED ORDER — PREDNISONE 2.5 MG PO TABS
2.5000 mg | ORAL_TABLET | Freq: Every day | ORAL | Status: DC
  Administered 2017-08-03 – 2017-08-06 (×4): 2.5 mg via ORAL
  Filled 2017-08-02 (×4): qty 1

## 2017-08-02 MED ORDER — PANTOPRAZOLE SODIUM 40 MG IV SOLR
40.0000 mg | Freq: Every day | INTRAVENOUS | Status: DC
  Administered 2017-08-02 – 2017-08-04 (×3): 40 mg via INTRAVENOUS
  Filled 2017-08-02 (×3): qty 40

## 2017-08-02 NOTE — Progress Notes (Signed)
Patient curious as to why he has not been on his home medications. He stated that he can tell he has not taken them because his face and head starts to feel funny. Called Dr Evette CristalSankar. Sankar ordered to restart home meds.

## 2017-08-02 NOTE — Plan of Care (Signed)
Patient is progressing and had several bowel movements yesterday.  Keith Craig, [name] N

## 2017-08-02 NOTE — Progress Notes (Signed)
Patient ID: Keith Craig, male   DOB: [birth year], 71 y.o.   MRN: [id number] Patient denies any pain and no nausea or vomiting since the NG tube came out. He continues to have some bowel movements.  Says he feels a lot better than when he came in. AVSS Abdomen remains mildly distended but less so than yesterday.  Bowel sounds are present. Abdominal x-ray surprisingly looks the same as 4 days ago. Clinically the patient appears to be making progress from his partial small bowel obstruction but the x-ray appears to be the same. We will continue with clear liquid diet for today.  If he continues to improve clinically diet can be advanced tomorrow.  Discussed fully with the patient

## 2017-08-03 LAB — CULTURE, BLOOD (ROUTINE X 2)
Culture: NO GROWTH
Culture: NO GROWTH
Special Requests: ADEQUATE
Special Requests: ADEQUATE

## 2017-08-03 NOTE — Care Management Important Message (Signed)
Important Message  Patient Details  Name: Keith BartersJames H Berber MRN: [id number] Date of Birth: [birth year]   Medicare Important Message Given:  Yes    Chapman FitchBOWEN, Shaunie Boehm T, RN 08/03/2017, 2:49 PM

## 2017-08-03 NOTE — Progress Notes (Signed)
Patient ID: Keith BartersJames H Craig, male   DOB: 11-20-1945, 71 y.o.   MRN: [id number]      SURGICAL PROGRESS NOTE   Hospital Day(s): 5.   Post op day(s):  Marland Kitchen.   Interval History: Patient seen and examined, no acute events or new complaints overnight. Patient reports having two bowel movement yesterday and one this morning, denies nausea or vomiting. .  Vital signs in last 24 hours: [min-max] current  Temp:  [97.7 F (36.5 C)-98.3 F (36.8 C)] 98.3 F (36.8 C) (12/24 0433) Pulse Rate:  [59-65] 59 (12/24 0433) Resp:  [20] 20 (12/24 0433) BP: (131-142)/(72-82) 131/72 (12/24 0433) SpO2:  [94 %-96 %] 94 % (12/24 0433)     Height: 6' (182.9 cm) Weight: 98.9 kg (218 lb) BMI (Calculated): 29.56    Physical Exam:  Constitutional: alert, cooperative and no distress  Respiratory: breathing non-labored at rest  Cardiovascular: regular rate and sinus rhythm  Gastrointestinal: soft, non-tender, and mild-distended  Labs:  CBC Latest Ref Rng & Units 07/30/2017 07/29/2017 07/28/2017  WBC 3.8 - 10.6 K/uL 7.6 12.4(H) 12.0(H)  Hemoglobin 13.0 - 18.0 g/dL 78.214.7 95.615.6 21.316.3  Hematocrit 40.0 - 52.0 % 43.0 46.5 48.1  Platelets 150 - 440 K/uL 195 217 226   CMP Latest Ref Rng & Units 07/30/2017 07/29/2017 07/28/2017  Glucose 65 - 99 mg/dL 086(V104(H) 784(O136(H) 962(X128(H)  BUN 6 - 20 mg/dL 13 20 52(W24(H)  Creatinine 0.61 - 1.24 mg/dL 4.131.03 2.440.99 0.101.22  Sodium 135 - 145 mmol/L 138 139 137  Potassium 3.5 - 5.1 mmol/L 3.6 3.5 3.5  Chloride 101 - 111 mmol/L 108 105 103  CO2 22 - 32 mmol/L 25 25 25   Calcium 8.9 - 10.3 mg/dL 8.0(L) 8.5(L) 9.3  Total Protein 6.5 - 8.1 g/dL - 6.9 7.4  Total Bilirubin 0.3 - 1.2 mg/dL - 0.9 1.0  Alkaline Phos 38 - 126 U/L - 61 64  AST 15 - 41 U/L - 21 24  ALT 17 - 63 U/L - 19 20    Imaging studies: Abdominal xary 08/02/17 with mildly distended small bowel loops compared with previous xray.    Assessment/Plan:  71 y.o. male with small bowel obstruction most likely due to adhesions. Patient  continue having bowel movements and passing gas. Imaging show some worsening of small bowel dilation compared to previous one but clinically patient is progressing and has not have nausea after 3 days of clear liquid diet. Will progress to full liquid diet and will order a new xray for tomorrow morning to compare with yesterday's now after having full liquid. Will also order new labs to assess for any electrolyte imbalance.   Gae GallopEdgardo Cintrn-Daz, MD

## 2017-08-04 ENCOUNTER — Inpatient Hospital Stay: Payer: Medicare Other

## 2017-08-04 LAB — CBC WITH DIFFERENTIAL/PLATELET
Basophils Absolute: 0.1 10*3/uL (ref 0–0.1)
Basophils Relative: 1 %
Eosinophils Absolute: 0.4 10*3/uL (ref 0–0.7)
Eosinophils Relative: 8 %
HCT: 39.8 % — ABNORMAL LOW (ref 40.0–52.0)
Hemoglobin: 13.7 g/dL (ref 13.0–18.0)
Lymphocytes Relative: 25 %
Lymphs Abs: 1.3 10*3/uL (ref 1.0–3.6)
MCH: 30.2 pg (ref 26.0–34.0)
MCHC: 34.3 g/dL (ref 32.0–36.0)
MCV: 87.9 fL (ref 80.0–100.0)
Monocytes Absolute: 0.7 10*3/uL (ref 0.2–1.0)
Monocytes Relative: 13 %
Neutro Abs: 2.6 10*3/uL (ref 1.4–6.5)
Neutrophils Relative %: 53 %
Platelets: 188 10*3/uL (ref 150–440)
RBC: 4.53 MIL/uL (ref 4.40–5.90)
RDW: 14.2 % (ref 11.5–14.5)
WBC: 5 10*3/uL (ref 3.8–10.6)

## 2017-08-04 LAB — RENAL FUNCTION PANEL
Albumin: 3.1 g/dL — ABNORMAL LOW (ref 3.5–5.0)
Anion gap: 3 — ABNORMAL LOW (ref 5–15)
BUN: 10 mg/dL (ref 6–20)
CO2: 23 mmol/L (ref 22–32)
Calcium: 8.6 mg/dL — ABNORMAL LOW (ref 8.9–10.3)
Chloride: 109 mmol/L (ref 101–111)
Creatinine, Ser: 0.99 mg/dL (ref 0.61–1.24)
GFR calc Af Amer: >60 mL/min (ref >60)
GFR calc non Af Amer: >60 mL/min (ref >60)
Glucose, Bld: 103 mg/dL — ABNORMAL HIGH (ref 65–99)
Phosphorus: 3.4 mg/dL (ref 2.5–4.6)
Potassium: 4 mmol/L (ref 3.5–5.1)
Sodium: 135 mmol/L (ref 135–145)

## 2017-08-04 LAB — MAGNESIUM: Magnesium: 1.9 mg/dL (ref 1.7–2.4)

## 2017-08-04 NOTE — Progress Notes (Signed)
Patient ID: Keith Craig, male   DOB: 20-Jun-1946, 71 y.o.   MRN: [id number]      SURGICAL PROGRESS NOTE   Hospital Day(s): 6.   Post op day(s):  Marland Kitchen.   Interval History: Patient seen and examined, no acute events or new complaints overnight. Patient reports no bowel movement today but passed gas, denies nausea or vomiting after eating.  Vital signs in last 24 hours: [min-max] current  Temp:  [97.6 F (36.4 C)-98.9 F (37.2 C)] 98.2 F (36.8 C) (12/25 0547) Pulse Rate:  [58-70] 58 (12/25 0547) Resp:  [18-20] 20 (12/25 0547) BP: (138-141)/(90-92) 138/92 (12/25 0547) SpO2:  [96 %-98 %] 97 % (12/25 0547)     Height: 6' (182.9 cm) Weight: 98.9 kg (218 lb) BMI (Calculated): 29.56    Physical Exam:  Constitutional: alert, cooperative and no distress  Respiratory: breathing non-labored at rest  Cardiovascular: regular rate and sinus rhythm  Gastrointestinal: soft, non-tender, and mild-distended  Labs:  CBC Latest Ref Rng & Units 08/04/2017 07/30/2017 07/29/2017  WBC 3.8 - 10.6 K/uL 5.0 7.6 12.4(H)  Hemoglobin 13.0 - 18.0 g/dL 47.813.7 29.514.7 62.115.6  Hematocrit 40.0 - 52.0 % 39.8(L) 43.0 46.5  Platelets 150 - 440 K/uL 188 195 217   CMP Latest Ref Rng & Units 08/04/2017 07/30/2017 07/29/2017  Glucose 65 - 99 mg/dL 308(M103(H) 578(I104(H) 696(E136(H)  BUN 6 - 20 mg/dL 10 13 20   Creatinine 0.61 - 1.24 mg/dL 9.520.99 8.411.03 3.240.99  Sodium 135 - 145 mmol/L 135 138 139  Potassium 3.5 - 5.1 mmol/L 4.0 3.6 3.5  Chloride 101 - 111 mmol/L 109 108 105  CO2 22 - 32 mmol/L 23 25 25   Calcium 8.9 - 10.3 mg/dL 4.0(N8.6(L) 0.2(V8.0(L) 2.5(D8.5(L)  Total Protein 6.5 - 8.1 g/dL - - 6.9  Total Bilirubin 0.3 - 1.2 mg/dL - - 0.9  Alkaline Phos 38 - 126 U/L - - 61  AST 15 - 41 U/L - - 21  ALT 17 - 63 U/L - - 19   Imaging studies: Abdominal xray 2 vies examined and found with decreased small bowel distention with adequate air on the large intestine.    Assessment/Plan: 71 y.o. male with small bowel obstruction. Tolerating full liquid diet but  still with mild abdominal distention. Most likely due to air in large intestine. Will keep in full liquid for today and assess if continue passing gas per rectum and if distention improves to be able to progress diet. Adequate electrolytes. Will decrease IVF's since patient is tolerating diet. Encourage to ambulate and continue incentive spirometer.   Gae GallopEdgardo Cintrn-Daz, MD

## 2017-08-05 LAB — CREATININE, SERUM
Creatinine, Ser: 0.98 mg/dL (ref 0.61–1.24)
GFR calc Af Amer: >60 mL/min (ref >60)
GFR calc non Af Amer: >60 mL/min (ref >60)

## 2017-08-05 MED ORDER — PANTOPRAZOLE SODIUM 40 MG PO TBEC
40.0000 mg | DELAYED_RELEASE_TABLET | Freq: Every day | ORAL | Status: DC
  Administered 2017-08-05 – 2017-08-06 (×2): 40 mg via ORAL
  Filled 2017-08-05 (×2): qty 1

## 2017-08-05 NOTE — Care Management Important Message (Signed)
Important Message  Patient Details  Name: Keith BartersJames H Seres MRN: [id number] Date of Birth: 1946/01/19   Medicare Important Message Given:  Yes Signed IM notice given   Eber HongGreene, Zolton Dowson R, RN 08/05/2017, 5:23 PM

## 2017-08-05 NOTE — Care Management (Signed)
Is tolerating advanced diet at present

## 2017-08-05 NOTE — Progress Notes (Signed)
PHARMACIST - PHYSICIAN COMMUNICATION  CONCERNING: IV to Oral Route Change Policy  RECOMMENDATION: This patient is receiving pantoprazole by the intravenous route.  Based on criteria approved by the Pharmacy and Therapeutics Committee, the intravenous medication(s) is/are being converted to the equivalent oral dose form(s).   DESCRIPTION: These criteria include:  The patient is eating (either orally or via tube) and/or has been taking other orally administered medications for a least 24 hours  The patient has no evidence of active gastrointestinal bleeding or impaired GI absorption (gastrectomy, short bowel, patient on TNA or NPO).  If you have questions about this conversion, please contact the Pharmacy Department  []   [phone]( [phone] )  [facility] [x]   [phone]( [phone] )  [facility] []   [phone]( [phone]  [facility] []   [phone]( [phone] )  [facility] []   [phone]( [phone] )  [facility]   Marty HeckWang, Kadra Kohan L, [facility] HospitalRPH 08/05/2017 8:22 AM

## 2017-08-05 NOTE — Progress Notes (Signed)
Patient ID: Keith Craig, male   DOB: 1945-11-12, 71 y.o.   MRN: [id number]      SURGICAL PROGRESS NOTE   Hospital Day(s): 7.   Post op day(s):  Marland Kitchen.   Interval History: Patient seen and examined, no acute events or new complaints overnight. Patient denies nausea and vomiting. Refers passing gas.   Vital signs in last 24 hours: [min-max] current  Temp:  [97.5 F (36.4 C)-98.5 F (36.9 C)] 97.6 F (36.4 C) (12/26 1300) Pulse Rate:  [55-63] 62 (12/26 1300) Resp:  [16-19] 19 (12/26 1300) BP: (131-140)/(75-77) 132/77 (12/26 1300) SpO2:  [96 %-99 %] 97 % (12/26 1300)     Height: 6' (182.9 cm) Weight: 98.9 kg (218 lb) BMI (Calculated): 29.56    Physical Exam:  Constitutional: alert, cooperative and no distress  Respiratory: breathing non-labored at rest  Cardiovascular: regular rate and sinus rhythm  Gastrointestinal: soft, non-tender, and mild-distended  Labs:  CBC Latest Ref Rng & Units 08/04/2017 07/30/2017 07/29/2017  WBC 3.8 - 10.6 K/uL 5.0 7.6 12.4(H)  Hemoglobin 13.0 - 18.0 g/dL 82.913.7 56.214.7 13.015.6  Hematocrit 40.0 - 52.0 % 39.8(L) 43.0 46.5  Platelets 150 - 440 K/uL 188 195 217   CMP Latest Ref Rng & Units 08/05/2017 08/04/2017 07/30/2017  Glucose 65 - 99 mg/dL - 865(H103(H) 846(N104(H)  BUN 6 - 20 mg/dL - 10 13  Creatinine 6.290.61 - 1.24 mg/dL 5.280.98 4.130.99 2.441.03  Sodium 135 - 145 mmol/L - 135 138  Potassium 3.5 - 5.1 mmol/L - 4.0 3.6  Chloride 101 - 111 mmol/L - 109 108  CO2 22 - 32 mmol/L - 23 25  Calcium 8.9 - 10.3 mg/dL - 8.6(L) 8.0(L)  Total Protein 6.5 - 8.1 g/dL - - -  Total Bilirubin 0.3 - 1.2 mg/dL - - -  Alkaline Phos 38 - 126 U/L - - -  AST 15 - 41 U/L - - -  ALT 17 - 63 U/L - - -    Imaging studies: No new pertinent imaging studies   Assessment/Plan:  71 y.o. male with small bowel obstruction. Tolerated full liquid diet. Will progress to soft diet and assess for toleration. Encourage to continue ambulation. Encourage incentive spirometer.   Gae GallopEdgardo Cintrn-Daz,  MD

## 2017-08-06 NOTE — Final Progress Note (Signed)
SURGICAL PROGRESS NOTE   Hospital Day(s): 8.   Post op day(s):  Marland Kitchen.   Interval History: Patient seen and examined, no acute events or new complaints overnight. Patient reports feeling well today and tolerated soft diet without nause or vomitint. Continue passing gas.  Vital signs in last 24 hours: [min-max] current  Temp:  [97.4 F (36.3 C)-97.7 F (36.5 C)] 97.4 F (36.3 C) (12/27 0410) Pulse Rate:  [56-62] 61 (12/27 0959) Resp:  [17-19] 17 (12/27 0410) BP: (125-144)/(74-84) 125/74 (12/27 0959) SpO2:  [96 %-100 %] 100 % (12/27 0959)     Height: 6' (182.9 cm) Weight: 98.9 kg (218 lb) BMI (Calculated): 29.56    Physical Exam:  Constitutional: alert, cooperative and no distress  Respiratory: breathing non-labored at rest  Cardiovascular: regular rate and sinus rhythm  Gastrointestinal: soft, non-tender, and non-distended  Labs:  CBC Latest Ref Rng & Units 08/04/2017 07/30/2017 07/29/2017  WBC 3.8 - 10.6 K/uL 5.0 7.6 12.4(H)  Hemoglobin 13.0 - 18.0 g/dL 96.013.7 45.414.7 09.815.6  Hematocrit 40.0 - 52.0 % 39.8(L) 43.0 46.5  Platelets 150 - 440 K/uL 188 195 217   CMP Latest Ref Rng & Units 08/05/2017 08/04/2017 07/30/2017  Glucose 65 - 99 mg/dL - 119(J103(H) 478(G104(H)  BUN 6 - 20 mg/dL - 10 13  Creatinine 9.560.61 - 1.24 mg/dL 2.130.98 0.860.99 5.781.03  Sodium 135 - 145 mmol/L - 135 138  Potassium 3.5 - 5.1 mmol/L - 4.0 3.6  Chloride 101 - 111 mmol/L - 109 108  CO2 22 - 32 mmol/L - 23 25  Calcium 8.9 - 10.3 mg/dL - 8.6(L) 8.0(L)  Total Protein 6.5 - 8.1 g/dL - - -  Total Bilirubin 0.3 - 1.2 mg/dL - - -  Alkaline Phos 38 - 126 U/L - - -  AST 15 - 41 U/L - - -  ALT 17 - 63 U/L - - -    Imaging studies: No new pertinent imaging studies   Assessment/Plan:  71 y.o. male with small bowel obstruction that resolved with conservative management. The patient had soft diet yesterday and is tolerating with small frequent meals. No nausea or vomiting today. Last Xray shows improvement of small bowel pattern and  gas on the colon. Labs with no significant electrolyte abnormality. Patient will be discharged with soft diet. Patient and his wife were oriented about the diagnosis of small bowel obstruction, the plan of management, the possibility of recurrence with and/or without surgery. Will follow at the clinic in two weeks.   All of the above findings and recommendations were discussed with the patient, patient's wife, and the medical team, and all of patient's and wife's questions were answered to their expressed satisfaction.  Gae GallopEdgardo Cintrn-Daz, MD

## 2017-08-06 NOTE — Progress Notes (Signed)
Discharge instructions reviewed with patient and wife.  Understanding was verbalized and all questions were answered.  Patient discharged home ambulatory in stable condition. 

## 2017-08-06 NOTE — Discharge Instructions (Signed)
Small Bowel Obstruction °A small bowel obstruction is a blockage in the small bowel. The small bowel, which is also called the small intestine, is a long, slender tube that connects the stomach to the colon. When a person eats and drinks, food and fluids go from the stomach to the small bowel. This is where most of the nutrients in the food and fluids are absorbed. °A small bowel obstruction will prevent food and fluids from passing through the small bowel as they normally do during digestion. The small bowel can become partially or completely blocked. This can cause symptoms such as abdominal pain, vomiting, and bloating. If this condition is not treated, it can be dangerous because the small bowel could rupture. °What are the causes? °Common causes of this condition include: °· Scar tissue from previous surgery or radiation treatment. °· Recent surgery. This may cause the movements of the bowel to slow down and cause food to block the intestine. °· Hernias. °· Inflammatory bowel disease (colitis). °· Twisting of the bowel (volvulus). °· Tumors. °· A foreign body. °· Slipping of a part of the bowel into another part (intussusception). ° °What are the signs or symptoms? °Symptoms of this condition include: °· Abdominal pain. This may be dull cramps or sharp pain. It may occur in one area, or it may be present in the entire abdomen. Pain can range from mild to severe, depending on the degree of obstruction. °· Nausea and vomiting. Vomit may be greenish or a yellow bile color. °· Abdominal bloating. °· Constipation. °· Lack of passing gas. °· Frequent belching. °· Diarrhea. This may occur if the obstruction is partial and runny stool is able to leak around the obstruction. ° °How is this diagnosed? °This condition may be diagnosed based on a physical exam, medical history, and X-rays of the abdomen. You may also have other tests, such as a CT scan of the abdomen and pelvis. °How is this treated? °Treatment for this  condition depends on the cause and severity of the problem. Treatment options may include: °· Bed rest along with fluids and pain medicines that are given through an IV tube inserted into one of your veins. Sometimes, this is all that is needed for the obstruction to improve. °· Following a simple diet. In some cases, a clear liquid diet may be required for several days. This allows the bowel to rest. °· Placement of a small tube (nasogastric tube) into the stomach. When the bowel is blocked, it usually swells up like a balloon that is filled with air and fluids. The air and fluids may be removed by suction through the nasogastric tube. This can help with pain, discomfort, and nausea. It can also help the obstruction to clear up faster. °· Surgery. This may be required if other treatments do not work. Bowel obstruction from a hernia may require early surgery and can be an emergency procedure. Surgery may also be required for scar tissue that causes frequent or severe obstructions. ° °Follow these instructions at home: °· Get plenty of rest. °· Follow instructions from your health care provider about eating restrictions. You may need to avoid solid foods and consume only clear liquids until your condition improves. °· Take over-the-counter and prescription medicines only as told by your health care provider. °· Keep all follow-up visits as told by your health care provider. This is important. °Contact a health care provider if: °· You have a fever. °· You have chills. °Get help right away if: °·   You have increased pain or cramping. °· You vomit blood. °· You have uncontrolled vomiting or nausea. °· You cannot drink fluids because of vomiting or pain. °· You develop confusion. °· You begin feeling very dry or thirsty (dehydrated). °· You have severe bloating. °· You feel extremely weak or you faint. °This information is not intended to replace advice given to you by your health care provider. Make sure you discuss any  questions you have with your health care provider. °Document Released: 10/14/2005 Document Revised: 03/24/2016 Document Reviewed: 09/21/2014 °Elsevier Interactive Patient Education © 2018 [employer] ° °

## 2017-08-06 NOTE — Discharge Summary (Signed)
Physician Discharge Summary  Patient ID: Keith Craig MRN: [id number] DOB/AGE: 04-02-1946 71 y.o.  Admit date: 07/28/2017 Discharge date: 08/06/2017  Admission Diagnoses: Small bowel obstruction        Urinary tract infection                                         Obesity                                         Hypertention  Discharge Diagnoses:  Active Problems:   Small bowel obstruction (HCC)   Urinary tract infection   Obesity   Hypertention  Discharged Condition: good  Hospital Course: Patient admitted with active vomiting. NGT was placed and IV hydration started. Patient also with UTI as per urinalysis that was treated with antibiotics and resolved. With conservative management the abdomen improved and patient started passing gas per rectum. Patient started on clear liquid diet which tolerated, the NGT removed and diet progressed slowly to soft diet. After 24 hours of tolerating soft diet, patient continued passing gas and with soft abdomen. Blood pressure controlled with home medications  Consults: Internal Medicine (Hospitalist)  Significant Diagnostic Studies: labs: Urinalysis with urinary tract infection  Treatments: IV hydration, antibiotics: ceftriaxone, and bowel rest, ngt  Discharge Exam: Blood pressure 125/74, pulse 61, temperature (!) 97.4 F (36.3 C), temperature source Oral, resp. rate 17, height 6' (1.829 m), weight 98.9 kg (218 lb), SpO2 100 %. Constitutional: alert, cooperative and no distress  Respiratory: breathing non-labored at rest  Cardiovascular: regular rate and sinus rhythm  Gastrointestinal: soft, non-tender, and non-distended  Disposition: Home  Meds ordered this encounter  Medications  . ondansetron (ZOFRAN) injection 4 mg  . sodium chloride 0.9 % bolus 1,000 mL  . ondansetron (ZOFRAN) 4 MG/2ML injection    Mariane Duvalaylor, Alivia   : cabinet override  . DISCONTD: ondansetron (ZOFRAN-ODT) 4 MG disintegrating tablet    Mariane Duvalaylor, Alivia   :  cabinet override  . iopamidol (ISOVUE-300) 61 % injection 100 mL  . ondansetron (ZOFRAN) injection 4 mg  . cefTRIAXone (ROCEPHIN) 1 g in dextrose 5 % 50 mL IVPB - Premix    Order Specific Question:   Antibiotic Indication:    Answer:   UTI  . mometasone-formoterol (DULERA) 200-5 MCG/ACT inhaler 2 puff  . albuterol (PROVENTIL) (2.5 MG/3ML) 0.083% nebulizer solution 2.5 mg  . enoxaparin (LOVENOX) injection 40 mg  . DISCONTD: dextrose 5 % in lactated ringers infusion  . morphine 4 MG/ML injection 4 mg  . OR Linked Order Group   . diphenhydrAMINE (BENADRYL) 12.5 MG/5ML elixir 12.5 mg   . diphenhydrAMINE (BENADRYL) injection 12.5 mg  . OR Linked Order Group   . ondansetron (ZOFRAN-ODT) disintegrating tablet 4 mg   . ondansetron (ZOFRAN) injection 4 mg  . DISCONTD: pantoprazole (PROTONIX) injection 40 mg  . DISCONTD: metoprolol tartrate (LOPRESSOR) injection 5 mg  . hydrALAZINE (APRESOLINE) injection 10 mg  . ondansetron (ZOFRAN) 4 MG/2ML injection    Joseph ArtWoods, Charles   : cabinet override  . dextrose 5 % and 0.45 % NaCl with KCl 20 mEq/L infusion  . metoprolol tartrate (LOPRESSOR) injection 5 mg  . diatrizoate meglumine-sodium (GASTROGRAFIN) 66-10 % solution 90 mL  . magnesium sulfate IVPB 2 g 50 mL  . nitroGLYCERIN (NITROSTAT) SL  tablet 0.4 mg  . cloNIDine (CATAPRES) tablet 0.2 mg  . phenol (CHLORASEPTIC) mouth spray 1 spray  . menthol-cetylpyridinium (CEPACOL) lozenge 3 mg  . tamsulosin (FLOMAX) capsule 0.4 mg  . irbesartan (AVAPRO) tablet 75 mg  . finasteride (PROSCAR) tablet 5 mg  . predniSONE (DELTASONE) tablet 2.5 mg  . DISCONTD: pantoprazole (PROTONIX) injection 40 mg  . pantoprazole (PROTONIX) EC tablet 40 mg       Signed: Carolan Shiver 08/06/2017, 12:06 PM

## 2018-01-05 NOTE — Progress Notes (Deleted)
01/06/2018 5:57 PM   Keith Craig 02-Mar-1946 [id number]  Referring provider: Marguarite Arbour, MD [address] [facility] [place], [place] [address]  No chief complaint on file.   HPI: Patient is a 72 year old Caucasian male with BPH with LUTS who presents today for his yearly visit.  BPH WITH LUTS His IPSS score today is ***, which is *** lower urinary tract symptomatology.  He is *** with his quality life due to his urinary symptoms. His I PSS score was 7/2.  His major complaint today nocturia x 1.  He has had these symptoms for many years.  He denies any dysuria, hematuria or suprapubic pain.  He currently taking tamsulosin 0.4 mg daily and finasteride 5 mg daily.  He also denies any recent fevers, chills, nausea or vomiting.  He does not have a family history of PCa.   Score:  1-7 Mild 8-19 Moderate 20-35 Severe  PMH: Past Medical History:  Diagnosis Date  . Arthritis   . Benign essential microscopic hematuria   . BPH with obstruction/lower urinary tract symptoms   . Chills   . Chronic airway obstruction (HCC)   . Chronic obstructive asthma (HCC)   . Esophageal reflux   . HTN (hypertension)   . Over weight     Surgical History: Past Surgical History:  Procedure Laterality Date  . cataract surgery Bilateral   . CHOLECYSTECTOMY    . HIATAL HERNIA REPAIR    . microvascular decompression of left trigeminal nerve    . RHIZOTOMY    . small bowel restrictions    . URETHRAL STRICTURE DILATATION      Home Medications:  Allergies as of 01/06/2018      Reactions   Vitamin B12 [cyanocobalamin] Itching, Rash      Medication List        Accurate as of 01/05/18  5:57 PM. Always use your most recent med list.          ADVAIR DISKUS 250-50 MCG/DOSE Aepb Generic drug:  Fluticasone-Salmeterol INHALE 1 PUFF BY MOUTH EVERY 12 HOURS   albuterol 108 (90 Base) MCG/ACT inhaler Commonly known as:  PROVENTIL HFA;VENTOLIN HFA Inhale 2 puffs into the  lungs every 6 (six) hours as needed for wheezing or shortness of breath.   cloNIDine 0.2 MG tablet Commonly known as:  CATAPRES Take 0.2 mg by mouth 2 (two) times daily.   finasteride 5 MG tablet Commonly known as:  PROSCAR TAKE 1 TABLET (5 MG TOTAL) BY MOUTH DAILY.   pantoprazole 40 MG tablet Commonly known as:  PROTONIX Take 40 mg by mouth 2 (two) times daily.   predniSONE 2.5 MG tablet Commonly known as:  DELTASONE Take 7.5 mg by mouth daily.   tamsulosin 0.4 MG Caps capsule Commonly known as:  FLOMAX TAKE ONE CAPSULE BY MOUTH DAILY   valsartan 80 MG tablet Commonly known as:  DIOVAN Take 80 mg by mouth 2 (two) times daily.       Allergies:  Allergies  Allergen Reactions  . Vitamin B12 [Cyanocobalamin] Itching and Rash    Family History: Family History  Problem Relation Age of Onset  . Arthritis/Rheumatoid Father   . Kidney disease Neg Hx   . Prostate cancer Neg Hx   . Kidney cancer Neg Hx   . Bladder Cancer Neg Hx     Social History:  reports that he quit smoking about 48 years ago. He has never used smokeless tobacco. He reports that he does not  drink alcohol or use drugs.  ROS:                                        Physical Exam: There were no vitals taken for this visit.  Constitutional: Well nourished. Alert and oriented, No acute distress. HEENT: [place] AT, moist mucus membranes. Trachea midline, no masses. Cardiovascular: No clubbing, cyanosis, or edema. Respiratory: Normal respiratory effort, no increased work of breathing. GI: Abdomen is soft, non tender, non distended, no abdominal masses. Liver and spleen not palpable.  No hernias appreciated.  Stool sample for occult testing is not indicated.   GU: No CVA tenderness.  No bladder fullness or masses.  Patient with circumcised phallus.  Urethral meatus is patent.  No penile discharge. No penile lesions or rashes. Scrotum without lesions, cysts, rashes and/or edema.  Testicles  are located scrotally bilaterally. No masses are appreciated in the testicles. Left and right epididymis are normal. Rectal: Patient deferred exam.   Skin: No rashes, bruises or suspicious lesions. Lymph: No cervical or inguinal adenopathy. Neurologic: Grossly intact, no focal deficits, moving all 4 extremities. Psychiatric: Normal mood and affect. ***  Constitutional: Well nourished. Alert and oriented, No acute distress. HEENT: [place] AT, moist mucus membranes. Trachea midline, no masses. Cardiovascular: No clubbing, cyanosis, or edema. Respiratory: Normal respiratory effort, no increased work of breathing. GI: Abdomen is soft, non tender, non distended, no abdominal masses. Liver and spleen not palpable.  No hernias appreciated.  Stool sample for occult testing is not indicated.   GU: No CVA tenderness.  No bladder fullness or masses.  Patient with circumcised/uncircumcised phallus. ***Foreskin easily retracted***  Urethral meatus is patent.  No penile discharge. No penile lesions or rashes. Scrotum without lesions, cysts, rashes and/or edema.  Testicles are located scrotally bilaterally. No masses are appreciated in the testicles. Left and right epididymis are normal. Rectal: Patient with  normal sphincter tone. Anus and perineum without scarring or rashes. No rectal masses are appreciated. Prostate is approximately *** grams, *** nodules are appreciated. Seminal vesicles are normal. Skin: No rashes, bruises or suspicious lesions. Lymph: No cervical or inguinal adenopathy. Neurologic: Grossly intact, no focal deficits, moving all 4 extremities. Psychiatric: Normal mood and affect.   Laboratory Data: Results for orders placed or performed during the hospital encounter of 07/28/17  Urine culture  Result Value Ref Range   Specimen Description URINE, RANDOM    Special Requests NONE    Culture MULTIPLE SPECIES PRESENT, SUGGEST RECOLLECTION (A)    Report Status 07/30/2017 FINAL   Blood  culture (routine x 2)  Result Value Ref Range   Specimen Description BLOOD RIGHT AC    Special Requests      BOTTLES DRAWN AEROBIC AND ANAEROBIC Blood Culture adequate volume   Culture      NO GROWTH 5 DAYS Performed at [facility], [address], [place], [place] [address]    Report Status 08/03/2017 FINAL   Blood culture (routine x 2)  Result Value Ref Range   Specimen Description BLOOD LEFT FA    Special Requests      BOTTLES DRAWN AEROBIC AND ANAEROBIC Blood Culture adequate volume   Culture      NO GROWTH 5 DAYS Performed at [facility], [address] [place], [place] [address]    Report Status 08/03/2017 FINAL   Lipase, blood  Result Value Ref Range  Lipase 19 11 - 51 U/L  Comprehensive metabolic panel  Result Value Ref Range   Sodium 137 135 - 145 mmol/L   Potassium 3.5 3.5 - 5.1 mmol/L   Chloride 103 101 - 111 mmol/L   CO2 25 22 - 32 mmol/L   Glucose, Bld 128 (H) 65 - 99 mg/dL   BUN 24 (H) 6 - 20 mg/dL   Creatinine, Ser 4.54 0.61 - 1.24 mg/dL   Calcium 9.3 8.9 - 09.8 mg/dL   Total Protein 7.4 6.5 - 8.1 g/dL   Albumin 4.0 3.5 - 5.0 g/dL   AST 24 15 - 41 U/L   ALT 20 17 - 63 U/L   Alkaline Phosphatase 64 38 - 126 U/L   Total Bilirubin 1.0 0.3 - 1.2 mg/dL   GFR calc non Af Amer 58 (L) >60 mL/min   GFR calc Af Amer >60 >60 mL/min   Anion gap 9 5 - 15  CBC  Result Value Ref Range   WBC 12.0 (H) 3.8 - 10.6 K/uL   RBC 5.55 4.40 - 5.90 MIL/uL   Hemoglobin 16.3 13.0 - 18.0 g/dL   HCT 11.9 14.7 - 82.9 %   MCV 86.7 80.0 - 100.0 fL   MCH 29.4 26.0 - 34.0 pg   MCHC 33.9 32.0 - 36.0 g/dL   RDW 56.2 13.0 - 86.5 %   Platelets 226 150 - 440 K/uL  Urinalysis, Complete w Microscopic  Result Value Ref Range   Color, Urine AMBER (A) YELLOW   APPearance CLOUDY (A) CLEAR   Specific Gravity, Urine 1.031 (H) 1.005 - 1.030   pH 5.0 5.0 - 8.0   Glucose, UA NEGATIVE NEGATIVE mg/dL   Hgb urine dipstick SMALL (A) NEGATIVE   Bilirubin Urine NEGATIVE  NEGATIVE   Ketones, ur NEGATIVE NEGATIVE mg/dL   Protein, ur 30 (A) NEGATIVE mg/dL   Nitrite NEGATIVE NEGATIVE   Leukocytes, UA LARGE (A) NEGATIVE   RBC / HPF TOO NUMEROUS TO COUNT 0 - 5 RBC/hpf   WBC, UA TOO NUMEROUS TO COUNT 0 - 5 WBC/hpf   Bacteria, UA RARE (A) NONE SEEN   Squamous Epithelial / LPF 0-5 (A) NONE SEEN   WBC Clumps PRESENT    Mucus PRESENT    Ca Oxalate Crys, UA PRESENT   Lactic acid, plasma  Result Value Ref Range   Lactic Acid, Venous 1.2 0.5 - 1.9 mmol/L  Comprehensive metabolic panel  Result Value Ref Range   Sodium 139 135 - 145 mmol/L   Potassium 3.5 3.5 - 5.1 mmol/L   Chloride 105 101 - 111 mmol/L   CO2 25 22 - 32 mmol/L   Glucose, Bld 136 (H) 65 - 99 mg/dL   BUN 20 6 - 20 mg/dL   Creatinine, Ser 7.84 0.61 - 1.24 mg/dL   Calcium 8.5 (L) 8.9 - 10.3 mg/dL   Total Protein 6.9 6.5 - 8.1 g/dL   Albumin 3.7 3.5 - 5.0 g/dL   AST 21 15 - 41 U/L   ALT 19 17 - 63 U/L   Alkaline Phosphatase 61 38 - 126 U/L   Total Bilirubin 0.9 0.3 - 1.2 mg/dL   GFR calc non Af Amer >60 >60 mL/min   GFR calc Af Amer >60 >60 mL/min   Anion gap 9 5 - 15  Magnesium  Result Value Ref Range   Magnesium 1.7 1.7 - 2.4 mg/dL  Phosphorus  Result Value Ref Range   Phosphorus 2.6 2.5 - 4.6 mg/dL  CBC  Result Value Ref Range   WBC 12.4 (H) 3.8 - 10.6 K/uL   RBC 5.27 4.40 - 5.90 MIL/uL   Hemoglobin 15.6 13.0 - 18.0 g/dL   HCT 16.1 09.6 - 04.5 %   MCV 88.2 80.0 - 100.0 fL   MCH 29.6 26.0 - 34.0 pg   MCHC 33.6 32.0 - 36.0 g/dL   RDW 40.9 81.1 - 91.4 %   Platelets 217 150 - 440 K/uL  CBC with Differential/Platelet  Result Value Ref Range   WBC 7.6 3.8 - 10.6 K/uL   RBC 4.86 4.40 - 5.90 MIL/uL   Hemoglobin 14.7 13.0 - 18.0 g/dL   HCT 78.2 95.6 - 21.3 %   MCV 88.5 80.0 - 100.0 fL   MCH 30.2 26.0 - 34.0 pg   MCHC 34.1 32.0 - 36.0 g/dL   RDW 08.6 57.8 - 46.9 %   Platelets 195 150 - 440 K/uL   Neutrophils Relative % 62 %   Neutro Abs 4.8 1.4 - 6.5 K/uL   Lymphocytes Relative 19  %   Lymphs Abs 1.4 1.0 - 3.6 K/uL   Monocytes Relative 12 %   Monocytes Absolute 0.9 0.2 - 1.0 K/uL   Eosinophils Relative 6 %   Eosinophils Absolute 0.4 0 - 0.7 K/uL   Basophils Relative 1 %   Basophils Absolute 0.1 0 - 0.1 K/uL  Basic metabolic panel  Result Value Ref Range   Sodium 138 135 - 145 mmol/L   Potassium 3.6 3.5 - 5.1 mmol/L   Chloride 108 101 - 111 mmol/L   CO2 25 22 - 32 mmol/L   Glucose, Bld 104 (H) 65 - 99 mg/dL   BUN 13 6 - 20 mg/dL   Creatinine, Ser 6.29 0.61 - 1.24 mg/dL   Calcium 8.0 (L) 8.9 - 10.3 mg/dL   GFR calc non Af Amer >60 >60 mL/min   GFR calc Af Amer >60 >60 mL/min   Anion gap 5 5 - 15  Magnesium  Result Value Ref Range   Magnesium 2.0 1.7 - 2.4 mg/dL  Troponin I (q 6hr x 3)  Result Value Ref Range   Troponin I <0.03 <0.03 ng/mL  Troponin I (q 6hr x 3)  Result Value Ref Range   Troponin I <0.03 <0.03 ng/mL  Troponin I (q 6hr x 3)  Result Value Ref Range   Troponin I <0.03 <0.03 ng/mL  CBC with Differential/Platelet  Result Value Ref Range   WBC 5.0 3.8 - 10.6 K/uL   RBC 4.53 4.40 - 5.90 MIL/uL   Hemoglobin 13.7 13.0 - 18.0 g/dL   HCT 52.8 (L) 41.3 - 24.4 %   MCV 87.9 80.0 - 100.0 fL   MCH 30.2 26.0 - 34.0 pg   MCHC 34.3 32.0 - 36.0 g/dL   RDW 01.0 27.2 - 53.6 %   Platelets 188 150 - 440 K/uL   Neutrophils Relative % 53 %   Neutro Abs 2.6 1.4 - 6.5 K/uL   Lymphocytes Relative 25 %   Lymphs Abs 1.3 1.0 - 3.6 K/uL   Monocytes Relative 13 %   Monocytes Absolute 0.7 0.2 - 1.0 K/uL   Eosinophils Relative 8 %   Eosinophils Absolute 0.4 0 - 0.7 K/uL   Basophils Relative 1 %   Basophils Absolute 0.1 0 - 0.1 K/uL  Renal function panel  Result Value Ref Range   Sodium 135 135 - 145 mmol/L   Potassium 4.0 3.5 - 5.1 mmol/L   Chloride 109 101 -  111 mmol/L   CO2 23 22 - 32 mmol/L   Glucose, Bld 103 (H) 65 - 99 mg/dL   BUN 10 6 - 20 mg/dL   Creatinine, Ser 1.61 0.61 - 1.24 mg/dL   Calcium 8.6 (L) 8.9 - 10.3 mg/dL   Phosphorus 3.4 2.5 -  4.6 mg/dL   Albumin 3.1 (L) 3.5 - 5.0 g/dL   GFR calc non Af Amer >60 >60 mL/min   GFR calc Af Amer >60 >60 mL/min   Anion gap 3 (L) 5 - 15  Magnesium  Result Value Ref Range   Magnesium 1.9 1.7 - 2.4 mg/dL  Creatinine, serum  Result Value Ref Range   Creatinine, Ser 0.98 0.61 - 1.24 mg/dL   GFR calc non Af Amer >60 >60 mL/min   GFR calc Af Amer >60 >60 mL/min   Lab Results  Component Value Date   WBC 5.0 08/04/2017   HGB 13.7 08/04/2017   HCT 39.8 (L) 08/04/2017   MCV 87.9 08/04/2017   PLT 188 08/04/2017    Lab Results  Component Value Date   CREATININE 0.98 08/05/2017    PSA History  0.5 ng/mL on 11/08/2013  0.34 ng/mL on 01/18/2014  0.5 ng/mL on 05/10/2014  0.26 ng/mL on 01/16/2015  0.4 ng/mL on 12/31/2015  0.4 ng/mL on 12/23/2016  Lab Results  Component Value Date   AST 21 07/29/2017   Lab Results  Component Value Date   ALT 19 07/29/2017   I have reviewed the labs.    Assessment & Plan:    1. BPH with LUTS  -IPSS score is 7/2, it is imprving   -Continue tamsulosin 0.4 mg daily and finasteride 5 mg daily  -RTC in 12 months for I PSS and exam    2. Nocturia  - nocturia is down to one a night     No follow-ups on file.  These notes generated with voice recognition software. I apologize for typographical errors.  Michiel Cowboy, PA-C  [facility] [address] [place], [place] [address] [phone]

## 2018-01-06 ENCOUNTER — Encounter: Payer: Self-pay | Admitting: Urology

## 2018-01-06 ENCOUNTER — Ambulatory Visit: Payer: Medicare Other | Admitting: Urology

## 2018-03-31 ENCOUNTER — Telehealth: Payer: Self-pay | Admitting: Urology

## 2018-03-31 DIAGNOSIS — R351 Nocturia: Secondary | ICD-10-CM

## 2018-03-31 DIAGNOSIS — N401 Enlarged prostate with lower urinary tract symptoms: Secondary | ICD-10-CM

## 2018-03-31 DIAGNOSIS — N138 Other obstructive and reflux uropathy: Secondary | ICD-10-CM

## 2018-03-31 NOTE — Telephone Encounter (Signed)
Patient called the office today requesting refills on Tamsulosin and Finasteride to be sent to [facility] [address].  I advised the patient that he has not been seen since May 2018.  I scheduled a follow up appointment for 04/19/18.  Can he get a refill on these meds until his appointment?  Please advise.  He can be reached at [phone].

## 2018-03-31 NOTE — Telephone Encounter (Signed)
Yes. We can do a 30 day supply of each of the medications, tamsulosin and finasteride.

## 2018-04-01 MED ORDER — FINASTERIDE 5 MG PO TABS: 5.0000 mg | ORAL_TABLET | Freq: Every day | ORAL | 0 refills | Status: DC

## 2018-04-01 MED ORDER — TAMSULOSIN HCL 0.4 MG PO CAPS: 0.4000 mg | ORAL_CAPSULE | Freq: Every day | ORAL | 0 refills | Status: DC

## 2018-04-01 NOTE — Addendum Note (Signed)
Addended by: Martha ClanWATTS, SARAH M on: 04/01/2018 04:35 PM   Modules accepted: Orders

## 2018-04-01 NOTE — Telephone Encounter (Signed)
Scripts sent

## 2018-04-19 ENCOUNTER — Encounter: Payer: Self-pay | Admitting: Urology

## 2018-04-19 ENCOUNTER — Ambulatory Visit (INDEPENDENT_AMBULATORY_CARE_PROVIDER_SITE_OTHER): Payer: Medicare Other | Admitting: Urology

## 2018-04-19 VITALS — BP 165/96 | HR 87 | Ht 72.0 in | Wt 218.0 lb

## 2018-04-19 DIAGNOSIS — R351 Nocturia: Secondary | ICD-10-CM

## 2018-04-19 DIAGNOSIS — N401 Enlarged prostate with lower urinary tract symptoms: Secondary | ICD-10-CM | POA: Diagnosis not present

## 2018-04-19 DIAGNOSIS — N138 Other obstructive and reflux uropathy: Secondary | ICD-10-CM | POA: Diagnosis not present

## 2018-04-19 MED ORDER — FINASTERIDE 5 MG PO TABS: 5.0000 mg | ORAL_TABLET | Freq: Every day | ORAL | 3 refills | Status: DC

## 2018-04-19 MED ORDER — TAMSULOSIN HCL 0.4 MG PO CAPS: 0.4000 mg | ORAL_CAPSULE | Freq: Every day | ORAL | 3 refills | Status: DC

## 2018-04-19 NOTE — Progress Notes (Signed)
04/19/2018 3:23 PM   Keith Craig 18-Jan-1946 [id number]  Referring provider: Marguarite Arbour, MD [address] [facility] [place], [place] [address]  Chief Complaint  Patient presents with  . Medication Refill    HPI: Patient is a 72 year old Caucasian male with BPH with LUTS who presents today for his yearly visit.  BPH WITH LUTS His IPSS score today is 8, which is moderate lower urinary tract symptomatology.  He is mostly satisfied with his quality life due to his urinary symptoms. His I PSS score was 7/2.  His major complaint today are frequency, nocturia and a weak stream.   He has had these symptoms for many years.  He denies any dysuria, hematuria or suprapubic pain.  He currently taking tamsulosin 0.4 mg daily and finasteride 5 mg daily.  He also denies any recent fevers, chills, nausea or vomiting.  He does not have a family history of PCa. IPSS    Row Name 04/19/18 1400         International Prostate Symptom Score   How often have you had the sensation of not emptying your bladder?  Not at All     How often have you had to urinate less than every two hours?  Less than 1 in 5 times     How often have you found you stopped and started again several times when you urinated?  Not at All     How often have you found it difficult to postpone urination?  Not at All     How often have you had a weak urinary stream?  Less than half the time     How often have you had to strain to start urination?  Not at All     How many times did you typically get up at night to urinate?  5 Times     Total IPSS Score  8       Quality of Life due to urinary symptoms   If you were to spend the rest of your life with your urinary condition just the way it is now how would you feel about that?  Mostly Satisfied        Score:  1-7 Mild 8-19 Moderate 20-35 Severe  PMH: Past Medical History:  Diagnosis Date  . Arthritis   . Benign essential microscopic hematuria   .  BPH with obstruction/lower urinary tract symptoms   . Chills   . Chronic airway obstruction (HCC)   . Chronic obstructive asthma (HCC)   . Esophageal reflux   . HTN (hypertension)   . Over weight     Surgical History: Past Surgical History:  Procedure Laterality Date  . cataract surgery Bilateral   . CHOLECYSTECTOMY    . HIATAL HERNIA REPAIR    . microvascular decompression of left trigeminal nerve    . RHIZOTOMY    . small bowel restrictions    . URETHRAL STRICTURE DILATATION      Home Medications:  Allergies as of 04/19/2018      Reactions   Vitamin B12 [cyanocobalamin] Itching, Rash      Medication List        Accurate as of 04/19/18  3:23 PM. Always use your most recent med list.          ADVAIR DISKUS 250-50 MCG/DOSE Aepb Generic drug:  Fluticasone-Salmeterol INHALE 1 PUFF BY MOUTH EVERY 12 HOURS   albuterol 108 (90 Base) MCG/ACT inhaler Commonly known as:  PROVENTIL  HFA;VENTOLIN HFA Inhale 2 puffs into the lungs every 6 (six) hours as needed for wheezing or shortness of breath.   cloNIDine 0.2 MG tablet Commonly known as:  CATAPRES Take 0.2 mg by mouth 2 (two) times daily.   finasteride 5 MG tablet Commonly known as:  PROSCAR Take 1 tablet (5 mg total) by mouth daily.   pantoprazole 40 MG tablet Commonly known as:  PROTONIX Take 40 mg by mouth 2 (two) times daily.   predniSONE 2.5 MG tablet Commonly known as:  DELTASONE Take 7.5 mg by mouth daily.   tamsulosin 0.4 MG Caps capsule Commonly known as:  FLOMAX Take 1 capsule (0.4 mg total) by mouth daily.   valsartan 80 MG tablet Commonly known as:  DIOVAN Take 80 mg by mouth 2 (two) times daily.       Allergies:  Allergies  Allergen Reactions  . Vitamin B12 [Cyanocobalamin] Itching and Rash    Family History: Family History  Problem Relation Age of Onset  . Arthritis/Rheumatoid Father   . Kidney disease Neg Hx   . Prostate cancer Neg Hx   . Kidney cancer Neg Hx   . Bladder Cancer Neg  Hx     Social History:  reports that he quit smoking about 48 years ago. He has never used smokeless tobacco. He reports that he does not drink alcohol or use drugs.  ROS: UROLOGY Frequent Urination?: Yes Hard to postpone urination?: No Burning/pain with urination?: No Get up at night to urinate?: Yes Leakage of urine?: No Urine stream starts and stops?: No Trouble starting stream?: No Do you have to strain to urinate?: No Blood in urine?: No Urinary tract infection?: No Sexually transmitted disease?: No Injury to kidneys or bladder?: No Painful intercourse?: No Weak stream?: Yes Erection problems?: No Penile pain?: No  Gastrointestinal Nausea?: No Vomiting?: No Indigestion/heartburn?: No Diarrhea?: No Constipation?: No  Constitutional Fever: No Night sweats?: No Weight loss?: No Fatigue?: No  Skin Skin rash/lesions?: No Itching?: No  Eyes Blurred vision?: No Double vision?: No  Ears/Nose/Throat Sore throat?: No Sinus problems?: No  Hematologic/Lymphatic Swollen glands?: No Easy bruising?: Yes  Cardiovascular Leg swelling?: No Chest pain?: No  Respiratory Cough?: Yes Shortness of breath?: Yes  Endocrine Excessive thirst?: No  Musculoskeletal Back pain?: No Joint pain?: No  Neurological Headaches?: No Dizziness?: No  Psychologic Depression?: No Anxiety?: No  Physical Exam: BP (!) 165/96   Pulse 87   Ht 6' (1.829 m)   Wt 218 lb (98.9 kg)   BMI 29.57 kg/m   Constitutional: Well nourished. Alert and oriented, No acute distress. HEENT: [place] AT, moist mucus membranes. Trachea midline, no masses. Cardiovascular: No clubbing, cyanosis, or edema. Respiratory: Normal respiratory effort, no increased work of breathing. GI: Abdomen is soft, non tender, non distended, no abdominal masses. Liver and spleen not palpable.  No hernias appreciated.  Stool sample for occult testing is not indicated.   GU: No CVA tenderness.  No bladder fullness or  masses.  Patient with circumcised phallus.  Urethral meatus is patent.  No penile discharge. No penile lesions or rashes. Scrotum without lesions, cysts, rashes and/or edema.  Testicles are located scrotally bilaterally. No masses are appreciated in the testicles. Left and right epididymis are normal. Rectal: Deferred.   Skin: No rashes, bruises or suspicious lesions. Lymph: No cervical or inguinal adenopathy. Neurologic: Grossly intact, no focal deficits, moving all 4 extremities. Psychiatric: Normal mood and affect.  Laboratory Data: Lab Results  Component Value Date  WBC 5.0 08/04/2017   HGB 13.7 08/04/2017   HCT 39.8 (L) 08/04/2017   MCV 87.9 08/04/2017   PLT 188 08/04/2017    Lab Results  Component Value Date   CREATININE 0.98 08/05/2017    PSA History  0.5 ng/mL on 11/08/2013  0.34 ng/mL on 01/18/2014  0.5 ng/mL on 05/10/2014  0.26 ng/mL on 01/16/2015  0.4 ng/mL on 12/31/2015  0.4 ng/mL on 05/15/201  Lab Results  Component Value Date   AST 21 07/29/2017   Lab Results  Component Value Date   ALT 19 07/29/2017   I have reviewed the labs.  Assessment & Plan:    1. BPH with LU TS IPSS score is 8/2, it is slightly worse Continue tamsulosin 0.4 mg daily and finasteride 5 mg daily; refills given RTC in 12 months for I PSS and exam    2. Nocturia  - nocturia is down to one a night    Return in about 1 year (around 04/20/2019) for IPSS, PSA and exam.  These notes generated with voice recognition software. I apologize for typographical errors.  Michiel Cowboy, PA-C  [facility] [address] [place], [place] [address] [phone]

## 2018-04-20 LAB — PSA: Prostate Specific Ag, Serum: 0.5 ng/mL (ref 0.0–4.0)

## 2018-07-21 ENCOUNTER — Other Ambulatory Visit: Payer: Self-pay | Admitting: Internal Medicine

## 2018-07-21 DIAGNOSIS — R0600 Dyspnea, unspecified: Secondary | ICD-10-CM

## 2018-07-21 DIAGNOSIS — R053 Chronic cough: Secondary | ICD-10-CM

## 2018-07-21 DIAGNOSIS — J439 Emphysema, unspecified: Secondary | ICD-10-CM

## 2018-07-21 DIAGNOSIS — R140 Abdominal distension (gaseous): Secondary | ICD-10-CM

## 2018-07-21 DIAGNOSIS — R1084 Generalized abdominal pain: Secondary | ICD-10-CM

## 2018-07-21 DIAGNOSIS — R05 Cough: Secondary | ICD-10-CM

## 2018-07-21 DIAGNOSIS — Z8719 Personal history of other diseases of the digestive system: Secondary | ICD-10-CM

## 2018-08-02 ENCOUNTER — Ambulatory Visit
Admission: RE | Admit: 2018-08-02 | Discharge: 2018-08-02 | Disposition: A | Discharge status: Home / Self Care | Payer: Medicare Other | Source: Ambulatory Visit | Attending: Internal Medicine | Admitting: Internal Medicine

## 2018-08-02 DIAGNOSIS — R05 Cough: Secondary | ICD-10-CM

## 2018-08-02 DIAGNOSIS — R140 Abdominal distension (gaseous): Secondary | ICD-10-CM | POA: Diagnosis present

## 2018-08-02 DIAGNOSIS — R0600 Dyspnea, unspecified: Secondary | ICD-10-CM | POA: Diagnosis present

## 2018-08-02 DIAGNOSIS — Z8719 Personal history of other diseases of the digestive system: Secondary | ICD-10-CM

## 2018-08-02 DIAGNOSIS — R1084 Generalized abdominal pain: Secondary | ICD-10-CM

## 2018-08-02 DIAGNOSIS — J439 Emphysema, unspecified: Secondary | ICD-10-CM

## 2018-08-02 DIAGNOSIS — R053 Chronic cough: Secondary | ICD-10-CM

## 2018-08-02 MED ORDER — IOPAMIDOL (ISOVUE-300) INJECTION 61%
100.0000 mL | Freq: Once | INTRAVENOUS | Status: AC | PRN
  Administered 2018-08-02: 100 mL via INTRAVENOUS

## 2018-11-01 ENCOUNTER — Other Ambulatory Visit: Payer: Self-pay | Admitting: Internal Medicine

## 2018-11-01 DIAGNOSIS — R911 Solitary pulmonary nodule: Secondary | ICD-10-CM

## 2019-01-12 ENCOUNTER — Ambulatory Visit
Admission: RE | Admit: 2019-01-12 | Discharge: 2019-01-12 | Disposition: A | Discharge status: Home / Self Care | Payer: Medicare Other | Source: Ambulatory Visit | Attending: Internal Medicine | Admitting: Internal Medicine

## 2019-01-12 ENCOUNTER — Other Ambulatory Visit: Payer: Self-pay

## 2019-01-12 DIAGNOSIS — R911 Solitary pulmonary nodule: Secondary | ICD-10-CM | POA: Diagnosis not present

## 2019-04-13 ENCOUNTER — Other Ambulatory Visit: Payer: Self-pay | Admitting: *Deleted

## 2019-04-13 DIAGNOSIS — N401 Enlarged prostate with lower urinary tract symptoms: Secondary | ICD-10-CM

## 2019-04-13 DIAGNOSIS — N138 Other obstructive and reflux uropathy: Secondary | ICD-10-CM

## 2019-04-14 ENCOUNTER — Other Ambulatory Visit: Payer: Self-pay

## 2019-04-14 ENCOUNTER — Other Ambulatory Visit: Payer: Medicare Other

## 2019-04-14 DIAGNOSIS — N138 Other obstructive and reflux uropathy: Secondary | ICD-10-CM

## 2019-04-15 LAB — PSA: Prostate Specific Ag, Serum: 0.5 ng/mL (ref 0.0–4.0)

## 2019-04-18 NOTE — Progress Notes (Signed)
04/19/2019 3:11 PM   Keith Craig [birth year] [id number]  Referring provider: Idelle Crouch, MD [facility] [place],  [place] [address]  Chief Complaint  Patient presents with  . Benign Prostatic Hypertrophy    HPI: Patient is a 73 year old male with BPH with LUTS who presents today for his yearly visit.  BPH WITH LUTS His IPSS score today is 10, which is moderate lower urinary tract symptomatology.  He is mostly satisfied with his quality life due to his urinary symptoms. His I PSS score was 8/2.  His major complaint today are frequency, nocturia and a weak stream.   He has had these symptoms for many years.  He denies any dysuria, hematuria or suprapubic pain.  He currently taking tamsulosin 0.4 mg daily and finasteride 5 mg daily.  He also denies any recent fevers, chills, nausea or vomiting.  He does not have a family history of PCa.  IPSS    Row Name 04/19/19 1400         International Prostate Symptom Score   How often have you had the sensation of not emptying your bladder?  Less than half the time     How often have you had to urinate less than every two hours?  Less than half the time     How often have you found you stopped and started again several times when you urinated?  Less than 1 in 5 times     How often have you found it difficult to postpone urination?  Not at All     How often have you had a weak urinary stream?  About half the time     How often have you had to strain to start urination?  Not at All     How many times did you typically get up at night to urinate?  2 Times     Total IPSS Score  10       Quality of Life due to urinary symptoms   If you were to spend the rest of your life with your urinary condition just the way it is now how would you feel about that?  Mostly Satisfied        Score:  1-7 Mild 8-19 Moderate 20-35 Severe  PMH: Past Medical History:  Diagnosis Date  . Arthritis   . Benign  essential microscopic hematuria   . BPH with obstruction/lower urinary tract symptoms   . Chills   . Chronic airway obstruction ([place])   . Chronic obstructive asthma ([place])   . Esophageal reflux   . HTN (hypertension)   . Over weight     Surgical History: Past Surgical History:  Procedure Laterality Date  . cataract surgery Bilateral   . CHOLECYSTECTOMY    . HIATAL HERNIA REPAIR    . microvascular decompression of left trigeminal nerve    . RHIZOTOMY    . small bowel restrictions    . URETHRAL STRICTURE DILATATION      Home Medications:  Allergies as of 04/19/2019      Reactions   Vitamin B12 [cyanocobalamin] Itching, Rash      Medication List       Accurate as of April 19, 2019  3:11 PM. If you have any questions, ask your nurse or doctor.        Advair Diskus 250-50 MCG/DOSE Aepb Generic drug: Fluticasone-Salmeterol INHALE 1 PUFF BY MOUTH EVERY 12 HOURS   albuterol 108 (90 Base)  MCG/ACT inhaler Commonly known as: VENTOLIN HFA Inhale 2 puffs into the lungs every 6 (six) hours as needed for wheezing or shortness of breath.   cloNIDine 0.2 MG tablet Commonly known as: CATAPRES Take 0.2 mg by mouth 2 (two) times daily.   finasteride 5 MG tablet Commonly known as: PROSCAR Take 1 tablet (5 mg total) by mouth daily.   pantoprazole 40 MG tablet Commonly known as: PROTONIX Take 40 mg by mouth 2 (two) times daily.   predniSONE 2.5 MG tablet Commonly known as: DELTASONE Take 7.5 mg by mouth daily.   tamsulosin 0.4 MG Caps capsule Commonly known as: FLOMAX Take 1 capsule (0.4 mg total) by mouth daily.   valsartan 80 MG tablet Commonly known as: DIOVAN Take 80 mg by mouth 2 (two) times daily.       Allergies:  Allergies  Allergen Reactions  . Vitamin B12 [Cyanocobalamin] Itching and Rash    Family History: Family History  Problem Relation Age of Onset  . Arthritis/Rheumatoid Father   . Kidney disease Neg Hx   . Prostate cancer Neg Hx   . Kidney  cancer Neg Hx   . Bladder Cancer Neg Hx     Social History:  reports that he quit smoking about 49 years ago. He has never used smokeless tobacco. He reports that he does not drink alcohol or use drugs.  ROS: UROLOGY Frequent Urination?: No Hard to postpone urination?: No Burning/pain with urination?: No Get up at night to urinate?: Yes Leakage of urine?: No Urine stream starts and stops?: No Trouble starting stream?: No Do you have to strain to urinate?: No Blood in urine?: No Urinary tract infection?: No Sexually transmitted disease?: No Injury to kidneys or bladder?: No Painful intercourse?: No Weak stream?: Yes Erection problems?: No Penile pain?: No  Gastrointestinal Nausea?: No Vomiting?: No Indigestion/heartburn?: No Diarrhea?: No Constipation?: No  Constitutional Fever: No Night sweats?: No Weight loss?: No Fatigue?: No  Skin Skin rash/lesions?: No Itching?: No  Eyes Blurred vision?: No Double vision?: No  Ears/Nose/Throat Sore throat?: No Sinus problems?: No  Hematologic/Lymphatic Swollen glands?: No Easy bruising?: Yes  Cardiovascular Leg swelling?: No Chest pain?: No  Respiratory Cough?: Yes Shortness of breath?: Yes  Endocrine Excessive thirst?: No  Musculoskeletal Back pain?: No Joint pain?: No  Neurological Headaches?: No Dizziness?: No  Psychologic Depression?: No Anxiety?: No  Physical Exam: BP (!) 142/82 (BP Location: Left Arm, Patient Position: Sitting, Cuff Size: Normal)   Pulse 82   Ht 6' (1.829 m)   Wt 223 lb (101.2 kg)   BMI 30.24 kg/m   Constitutional:  Well nourished. Alert and oriented, No acute distress. HEENT: [place] AT, moist mucus membranes.  Trachea midline, no masses. Cardiovascular: No clubbing, cyanosis, or edema. Respiratory: Normal respiratory effort, no increased work of breathing. GU: Patient deferred. Rectal: Patient deferred Skin: No rashes, bruises or suspicious lesions. Neurologic: Grossly  intact, no focal deficits, moving all 4 extremities. Psychiatric: Normal mood and affect.  Laboratory Data: Lab Results  Component Value Date   WBC 5.0 08/04/2017   HGB 13.7 08/04/2017   HCT 39.8 (L) 08/04/2017   MCV 87.9 08/04/2017   PLT 188 08/04/2017    Lab Results  Component Value Date   CREATININE 0.98 08/05/2017    PSA History  0.5 ng/mL on 11/08/2013  0.34 ng/mL on 01/18/2014  0.5 ng/mL on 05/10/2014  0.26 ng/mL on 01/16/2015  Component     Latest Ref Rng & Units 12/31/2015 12/23/2016 04/19/2018 04/14/2019  Prostate Specific Ag, Serum     0.0 - 4.0 ng/mL 0.4 0.4 0.5 0.5    Lab Results  Component Value Date   AST 21 07/29/2017   Lab Results  Component Value Date   ALT 19 07/29/2017   I have reviewed the labs.  Assessment & Plan:    1. BPH with LU TS IPSS score is 10/2, it is slightly worse Continue tamsulosin 0.4 mg daily and finasteride 5 mg daily; refills given Does not find symptoms bothersome RTC in 12 months for I PSS and exam    2. Nocturia Patient wakes up for other reasons - not to urinate   Return in about 1 year (around 04/18/2020) for IPSS, PSA and exam.  These notes generated with voice recognition software. I apologize for typographical errors.  Michiel CowboySHANNON Ashraf Mesta, PA-C  [facility] [address] [place], [place] [address] [phone]

## 2019-04-19 ENCOUNTER — Encounter: Payer: Self-pay | Admitting: Urology

## 2019-04-19 ENCOUNTER — Ambulatory Visit (INDEPENDENT_AMBULATORY_CARE_PROVIDER_SITE_OTHER): Payer: Medicare Other | Admitting: Urology

## 2019-04-19 ENCOUNTER — Other Ambulatory Visit: Payer: Self-pay

## 2019-04-19 VITALS — BP 142/82 | HR 82 | Ht 72.0 in | Wt 223.0 lb

## 2019-04-19 DIAGNOSIS — R351 Nocturia: Secondary | ICD-10-CM | POA: Diagnosis not present

## 2019-04-19 DIAGNOSIS — N401 Enlarged prostate with lower urinary tract symptoms: Secondary | ICD-10-CM

## 2019-04-19 DIAGNOSIS — N138 Other obstructive and reflux uropathy: Secondary | ICD-10-CM

## 2019-04-19 MED ORDER — FINASTERIDE 5 MG PO TABS: 5.0000 mg | ORAL_TABLET | Freq: Every day | ORAL | 3 refills | Status: DC

## 2019-04-19 MED ORDER — TAMSULOSIN HCL 0.4 MG PO CAPS: 0.4000 mg | ORAL_CAPSULE | Freq: Every day | ORAL | 3 refills | Status: DC

## 2019-06-23 ENCOUNTER — Other Ambulatory Visit: Payer: Self-pay | Admitting: Family Medicine

## 2019-06-23 DIAGNOSIS — N200 Calculus of kidney: Secondary | ICD-10-CM

## 2019-06-23 DIAGNOSIS — R109 Unspecified abdominal pain: Secondary | ICD-10-CM

## 2019-06-28 ENCOUNTER — Ambulatory Visit
Admission: RE | Admit: 2019-06-28 | Discharge: 2019-06-28 | Disposition: A | Discharge status: Home / Self Care | Payer: Medicare Other | Source: Ambulatory Visit | Attending: Family Medicine | Admitting: Family Medicine

## 2019-06-28 ENCOUNTER — Other Ambulatory Visit: Payer: Self-pay

## 2019-06-28 DIAGNOSIS — N200 Calculus of kidney: Secondary | ICD-10-CM | POA: Diagnosis not present

## 2019-06-28 DIAGNOSIS — R109 Unspecified abdominal pain: Secondary | ICD-10-CM | POA: Insufficient documentation

## 2019-07-14 ENCOUNTER — Other Ambulatory Visit: Admission: RE | Admit: 2019-07-14 | Payer: Medicare Other | Source: Ambulatory Visit

## 2019-07-18 ENCOUNTER — Ambulatory Visit: Admit: 2019-07-18 | Payer: Medicare Other | Admitting: Unknown Physician Specialty

## 2019-07-18 SURGERY — COLONOSCOPY WITH PROPOFOL
Anesthesia: General

## 2019-11-14 ENCOUNTER — Ambulatory Visit: Payer: Medicare Other | Admitting: Dermatology

## 2019-12-12 ENCOUNTER — Ambulatory Visit: Payer: Medicare Other | Admitting: Dermatology

## 2020-04-02 ENCOUNTER — Other Ambulatory Visit: Payer: Self-pay | Admitting: Urology

## 2020-04-02 DIAGNOSIS — N138 Other obstructive and reflux uropathy: Secondary | ICD-10-CM

## 2020-04-05 ENCOUNTER — Other Ambulatory Visit: Payer: Self-pay | Admitting: Family Medicine

## 2020-04-05 DIAGNOSIS — N138 Other obstructive and reflux uropathy: Secondary | ICD-10-CM

## 2020-04-11 ENCOUNTER — Other Ambulatory Visit: Payer: Medicare Other

## 2020-04-18 ENCOUNTER — Other Ambulatory Visit: Payer: Self-pay | Admitting: Urology

## 2020-04-18 ENCOUNTER — Ambulatory Visit: Payer: Medicare Other | Admitting: Urology

## 2020-04-18 DIAGNOSIS — R351 Nocturia: Secondary | ICD-10-CM

## 2020-04-30 ENCOUNTER — Other Ambulatory Visit: Payer: Medicare Other

## 2020-04-30 ENCOUNTER — Other Ambulatory Visit: Payer: Self-pay

## 2020-04-30 DIAGNOSIS — N138 Other obstructive and reflux uropathy: Secondary | ICD-10-CM

## 2020-05-01 LAB — PSA: Prostate Specific Ag, Serum: 0.4 ng/mL (ref 0.0–4.0)

## 2020-05-01 NOTE — Progress Notes (Signed)
05/02/2020 10:11 AM   Keith Craig Sep 29, 1945 [id number]  Referring provider: Marguarite Arbour, MD [address] [facility] [place],  [place] [address]  Chief Complaint  Patient presents with  . Benign Prostatic Hypertrophy    1year    HPI: Patient is a 74 year old male with BPH with LUTS who presents today for his yearly visit.  BPH WITH LUTS His IPSS score today is 5, which is mild lower urinary tract symptomatology.  He is pleased with his quality life due to his urinary symptoms.  His bladder scan is 48 mL.  His I PSS score was 10/2.  His major complaint today are frequency, nocturia and a weak stream.   He has had these symptoms for many years.  He denies any dysuria, hematuria or suprapubic pain.  He currently taking tamsulosin 0.4 mg daily and finasteride 5 mg daily.  He also denies any recent fevers, chills, nausea or vomiting.  He does not have a family history of PCa.   IPSS    Row Name 05/02/20 1500         International Prostate Symptom Score   How often have you had the sensation of not emptying your bladder? Not at All     How often have you had to urinate less than every two hours? Less than 1 in 5 times     How often have you found you stopped and started again several times when you urinated? Less than 1 in 5 times     How often have you found it difficult to postpone urination? Not at All     How often have you had a weak urinary stream? Less than 1 in 5 times     How often have you had to strain to start urination? Not at All     How many times did you typically get up at night to urinate? 2 Times     Total IPSS Score 5       Quality of Life due to urinary symptoms   If you were to spend the rest of your life with your urinary condition just the way it is now how would you feel about that? Mostly Satisfied            Score:  1-7 Mild 8-19 Moderate 20-35 Severe  Of note, he has had several instances of microscopic hematuria  on urinalysis with his primary care physician.  He has not had a formal hematuria work-up, but he has had several contrast CT's over the last several years.  The most recent was a noncontrast CT November 2020 which noted a 6 cm left upper pole cyst and a 1 cm bladder wall diverticuli.  Today's UA with > 30 RBC's.   PMH: Past Medical History:  Diagnosis Date  . Arthritis   . Benign essential microscopic hematuria   . BPH with obstruction/lower urinary tract symptoms   . Chills   . Chronic airway obstruction ([facility])   . Chronic obstructive asthma ([facility])   . Esophageal reflux   . HTN (hypertension)   . Over weight     Surgical History: Past Surgical History:  Procedure Laterality Date  . cataract surgery Bilateral   . CHOLECYSTECTOMY    . HIATAL HERNIA REPAIR    . microvascular decompression of left trigeminal nerve    . RHIZOTOMY    . small bowel restrictions    . URETHRAL STRICTURE DILATATION  Home Medications:  Allergies as of 05/02/2020      Reactions   Vitamin B12 [cyanocobalamin] Itching, Rash      Medication List       Accurate as of May 02, 2020 11:59 PM. If you have any questions, ask your nurse or doctor.        Advair Diskus 250-50 MCG/DOSE Aepb Generic drug: Fluticasone-Salmeterol INHALE 1 PUFF BY MOUTH EVERY 12 HOURS   albuterol 108 (90 Base) MCG/ACT inhaler Commonly known as: VENTOLIN HFA Inhale 2 puffs into the lungs every 6 (six) hours as needed for wheezing or shortness of breath.   cloNIDine 0.2 MG tablet Commonly known as: CATAPRES Take 0.2 mg by mouth 2 (two) times daily.   finasteride 5 MG tablet Commonly known as: PROSCAR Take 1 tablet (5 mg total) by mouth daily.   pantoprazole 40 MG tablet Commonly known as: PROTONIX Take 40 mg by mouth 2 (two) times daily.   predniSONE 2.5 MG tablet Commonly known as: DELTASONE Take 7.5 mg by mouth daily.   tamsulosin 0.4 MG Caps capsule Commonly known as: FLOMAX Take 1 capsule (0.4 mg  total) by mouth daily.   valsartan 80 MG tablet Commonly known as: DIOVAN Take 80 mg by mouth 2 (two) times daily.       Allergies:  Allergies  Allergen Reactions  . Vitamin B12 [Cyanocobalamin] Itching and Rash    Family History: Family History  Problem Relation Age of Onset  . Arthritis/Rheumatoid Father   . Kidney disease Neg Hx   . Prostate cancer Neg Hx   . Kidney cancer Neg Hx   . Bladder Cancer Neg Hx     Social History:  reports that he quit smoking about 50 years ago. He has never used smokeless tobacco. He reports that he does not drink alcohol and does not use drugs.  ROS: For pertinent review of systems please refer to history of present illness  Physical Exam: BP 105/65   Pulse 69   Ht 6' (1.829 m)   Wt 220 lb (99.8 kg)   BMI 29.84 kg/m   Constitutional:  Well nourished. Alert and oriented, No acute distress. HEENT: [place] AT, mask in place.  Trachea midline Cardiovascular: No clubbing, cyanosis, or edema. Respiratory: Normal respiratory effort, no increased work of breathing. GI: Abdomen is soft, non tender, non distended, no abdominal masses. Liver and spleen not palpable.  No hernias appreciated.  Stool sample for occult testing is not indicated.   GU: No CVA tenderness.  No bladder fullness or masses.  Patient with circumcised phallus.  Urethral meatus is patent.  No penile discharge. No penile lesions or rashes. Scrotum without lesions, cysts, rashes and/or edema.  Testicles are located scrotally bilaterally. No masses are appreciated in the testicles. Left and right epididymis are normal. Rectal: Deferred Skin: No rashes, bruises or suspicious lesions. Lymph: No inguinal adenopathy. Neurologic: Grossly intact, no focal deficits, moving all 4 extremities. Psychiatric: Normal mood and affect.  Laboratory Data: Urinalysis Component     Latest Ref Rng & Units 05/02/2020  Specific Gravity, UA     1.005 - 1.030 >1.030 (H)  pH, UA     5.0 - 7.5 5.0    Color, UA     Yellow Yellow  Appearance Ur     Clear Cloudy (A)  Leukocytes,UA     Negative 1+ (A)  Protein,UA     Negative/Trace 1+ (A)  Glucose, UA     Negative Negative  Ketones, UA  Negative Negative  RBC, UA     Negative 3+ (A)  Bilirubin, UA     Negative Negative  Urobilinogen, Ur     0.2 - 1.0 mg/dL 0.2  Nitrite, UA     Negative Negative  Microscopic Examination      See below:   Component     Latest Ref Rng & Units 05/02/2020  WBC, UA     0 - 5 /hpf >30 (A)  RBC     0 - 2 /hpf >30 (A)  Epithelial Cells (non renal)     0 - 10 /hpf 0-10  Mucus, UA     Not Estab. Present (A)  Bacteria, UA     None seen/Few Few   Lab Results  Component Value Date   WBC 5.0 08/04/2017   HGB 13.7 08/04/2017   HCT 39.8 (L) 08/04/2017   MCV 87.9 08/04/2017   PLT 188 08/04/2017    Lab Results  Component Value Date   CREATININE 0.98 08/05/2017    PSA History  0.5 ng/mL on 11/08/2013  0.34 ng/mL on 01/18/2014  0.5 ng/mL on 05/10/2014  0.26 ng/mL on 01/16/2015   Component     Latest Ref Rng & Units 12/31/2015 12/23/2016 04/19/2018 04/14/2019  Prostate Specific Ag, Serum     0.0 - 4.0 ng/mL 0.4 0.4 0.5 0.5   Component     Latest Ref Rng & Units 04/30/2020  Prostate Specific Ag, Serum     0.0 - 4.0 ng/mL 0.4    Lab Results  Component Value Date   AST 21 07/29/2017   Lab Results  Component Value Date   ALT 19 07/29/2017   I have reviewed the labs.  Assessment & Plan:    1. High risk hematuria - I explained to the patient that there are a number of causes that can be associated with blood in the urine, such as stones, BPH, UTI's, damage to the urinary tract and/or cancer. - At this time, I felt that the patient warranted further urologic evaluation.  The AUA guidelines state that a CT urogram is the preferred imaging study to evaluate hematuria. - I explained to the patient that a contrast material will be injected into a vein and that in rare instances, an  allergic reaction can result and may even life threatening (1:100,000)  The patient denies any allergies to contrast, iodine and/or seafood and is not taking metformin. - Following the imaging study,  I've recommended a cystoscopy. I described how this is performed, typically in an office setting with a flexible cystoscope. We described the risks, benefits, and possible side effects, the most common of which is a minor amount of blood in the urine and/or burning which usually resolves in 24 to 48 hours.   - The patient had the opportunity to ask questions which were answered. Based upon this discussion, the patient is willing to proceed. Therefore, I've ordered: a CT Urogram and cystoscopy. - The patient will return following all of the above for discussion of the results.  - UA - Urine culture   2. BPH with LU TS IPSS score is 10/2, it is slightly worse Continue tamsulosin 0.4 mg daily and finasteride 5 mg daily; refills given Does not find symptoms bothersome Did discuss with him the reason we perform DRE's is that the PSA is not always altered with a prostate cancer and so the digital rectal exam helps [place] evaluate for suspicious nodules that may signal a possible prostate cancer and  this happens in about 25% of cases-he still deferred DRE RTC in 12 months for I PSS and exam    3. Nocturia Patient wakes up for other reasons - not to urinate   Return for CT Urogram report and cystoscopy.  These notes generated with voice recognition software. I apologize for typographical errors.  Michiel Cowboy, PA-C  [facility] [address] [place], [place] [address] [phone]

## 2020-05-02 ENCOUNTER — Other Ambulatory Visit: Payer: Self-pay

## 2020-05-02 ENCOUNTER — Encounter: Payer: Self-pay | Admitting: Urology

## 2020-05-02 ENCOUNTER — Ambulatory Visit (INDEPENDENT_AMBULATORY_CARE_PROVIDER_SITE_OTHER): Payer: Medicare Other | Admitting: Urology

## 2020-05-02 VITALS — BP 105/65 | HR 69 | Ht 72.0 in | Wt 220.0 lb

## 2020-05-02 DIAGNOSIS — N401 Enlarged prostate with lower urinary tract symptoms: Secondary | ICD-10-CM

## 2020-05-02 DIAGNOSIS — R3129 Other microscopic hematuria: Secondary | ICD-10-CM

## 2020-05-02 DIAGNOSIS — N138 Other obstructive and reflux uropathy: Secondary | ICD-10-CM | POA: Diagnosis not present

## 2020-05-02 DIAGNOSIS — R351 Nocturia: Secondary | ICD-10-CM

## 2020-05-02 LAB — BLADDER SCAN AMB NON-IMAGING

## 2020-05-02 MED ORDER — FINASTERIDE 5 MG PO TABS: 5.0000 mg | ORAL_TABLET | Freq: Every day | ORAL | 3 refills | Status: DC

## 2020-05-02 MED ORDER — TAMSULOSIN HCL 0.4 MG PO CAPS: 0.4000 mg | ORAL_CAPSULE | Freq: Every day | ORAL | 3 refills | Status: DC

## 2020-05-04 LAB — URINALYSIS, COMPLETE
Bilirubin, UA: NEGATIVE
Glucose, UA: NEGATIVE
Ketones, UA: NEGATIVE
Nitrite, UA: NEGATIVE
Specific Gravity, UA: >1.03 — ABNORMAL HIGH (ref 1.005–1.030)
Urobilinogen, Ur: 0.2 mg/dL (ref 0.2–1.0)
pH, UA: 5 (ref 5.0–7.5)

## 2020-05-04 LAB — MICROSCOPIC EXAMINATION
RBC, Urine: >30 /hpf — AB (ref 0–2)
WBC, UA: >30 /hpf — AB (ref 0–5)

## 2020-05-07 ENCOUNTER — Telehealth: Payer: Self-pay | Admitting: Family Medicine

## 2020-05-07 LAB — CULTURE, URINE COMPREHENSIVE

## 2020-05-07 MED ORDER — SULFAMETHOXAZOLE-TRIMETHOPRIM 800-160 MG PO TABS: 1.0000 | ORAL_TABLET | Freq: Two times a day (BID) | ORAL | 0 refills | Status: DC

## 2020-05-07 NOTE — Telephone Encounter (Signed)
-----   Message from Harle Battiest, PA-C sent at 05/07/2020  7:58 AM EDT ----- Please let Mr. Fouts know that his urine culture is positive and we need for him to start Septra DS, BID x 7 days.

## 2020-05-07 NOTE — Telephone Encounter (Signed)
LMOM informed patient of positive UTI. ABX was sent to pharmacy.

## 2020-05-14 ENCOUNTER — Ambulatory Visit
Admission: RE | Admit: 2020-05-14 | Discharge: 2020-05-14 | Disposition: A | Discharge status: Home / Self Care | Payer: Medicare Other | Source: Ambulatory Visit | Attending: Urology | Admitting: Urology

## 2020-05-14 ENCOUNTER — Other Ambulatory Visit: Payer: Self-pay

## 2020-05-14 DIAGNOSIS — R3129 Other microscopic hematuria: Secondary | ICD-10-CM

## 2020-05-14 LAB — POCT I-STAT CREATININE: Creatinine, Ser: 1.8 mg/dL — ABNORMAL HIGH (ref 0.61–1.24)

## 2020-05-14 MED ORDER — IOHEXOL 300 MG/ML  SOLN: 150.0000 mL | Freq: Once | INTRAMUSCULAR | Status: DC | PRN

## 2020-05-14 NOTE — Progress Notes (Signed)
05/15/2020 4:33 PM   [name] 07/31/1946 [id number]  Referring provider: Marguarite Arbour, MD [address] [facility] [place],  [place] [address] Chief Complaint  Patient presents with   Cysto    HPI: Keith Craig. is a 74 y.o. male who presents for follow up of BPH with LUTS, nocturia and microscopic hematuria.   Patient was seen by Michiel Cowboy, PA-C on 05/02/2020 (see note for details).  CT hematuria work up on 05/14/2020 revealed no urolithiasis. No hydronephrosis. No suspicious renal cortical masses. No evidence of urothelial lesions, with limitations as described. Mild diffuse bladder wall thickening and trabeculation with small left bladder wall diverticulum, suggesting chronic bladder outlet obstruction. Moderate hiatal hernia. Stable 3.1 cm infrarenal abdominal aortic aneurysm.  Patient was treated for a positive UA 2 weeks ago. Bactrim course did not improve his symptoms. Denies burning sensation or pain. He notes an abnormal urine color x 1 month. He reports worsening nocturia. Denies fevers but notes chills a few nights ago.   He reports chills are a symptoms he usually gets with UTI's. He reports having several UTI in the past.    He has had some foley cath trauma. He had a failed cystoscopy with Dr. Retta Diones in 2012 intraoperatively after what sounds like Foley catheter trauma.  It is unclear whether he had follow-up cystoscopy thereafter.    PMH: Past Medical History:  Diagnosis Date   Arthritis    Benign essential microscopic hematuria    BPH with obstruction/lower urinary tract symptoms    Chills    Chronic airway obstruction (HCC)    Chronic obstructive asthma (HCC)    Esophageal reflux    HTN (hypertension)    Over weight     Surgical History: Past Surgical History:  Procedure Laterality Date   cataract surgery Bilateral    CHOLECYSTECTOMY     HIATAL HERNIA REPAIR     microvascular decompression  of left trigeminal nerve     RHIZOTOMY     small bowel restrictions     URETHRAL STRICTURE DILATATION      Home Medications:  Allergies as of 05/15/2020      Reactions   Vitamin B12 [cyanocobalamin] Itching, Rash      Medication List       Accurate as of May 15, 2020  4:33 PM. If you have any questions, ask your nurse or doctor.        Advair Diskus 250-50 MCG/DOSE Aepb Generic drug: Fluticasone-Salmeterol INHALE 1 PUFF BY MOUTH EVERY 12 HOURS   albuterol 108 (90 Base) MCG/ACT inhaler Commonly known as: VENTOLIN HFA Inhale 2 puffs into the lungs every 6 (six) hours as needed for wheezing or shortness of breath.   cloNIDine 0.2 MG tablet Commonly known as: CATAPRES Take 0.2 mg by mouth 2 (two) times daily.   finasteride 5 MG tablet Commonly known as: PROSCAR Take 1 tablet (5 mg total) by mouth daily.   pantoprazole 40 MG tablet Commonly known as: PROTONIX Take 40 mg by mouth 2 (two) times daily.   predniSONE 2.5 MG tablet Commonly known as: DELTASONE Take 7.5 mg by mouth daily.   sulfamethoxazole-trimethoprim 800-160 MG tablet Commonly known as: BACTRIM DS Take 1 tablet by mouth every 12 (twelve) hours.   tamsulosin 0.4 MG Caps capsule Commonly known as: FLOMAX Take 1 capsule (0.4 mg total) by mouth daily.   valsartan 80 MG tablet Commonly known as: DIOVAN Take 80 mg by mouth 2 (two)  times daily.       Allergies:  Allergies  Allergen Reactions   Vitamin B12 [Cyanocobalamin] Itching and Rash    Family History: Family History  Problem Relation Age of Onset   Arthritis/Rheumatoid Father    Kidney disease Neg Hx    Prostate cancer Neg Hx    Kidney cancer Neg Hx    Bladder Cancer Neg Hx     Social History:  reports that he quit smoking about 50 years ago. He has never used smokeless tobacco. He reports that he does not drink alcohol and does not use drugs.   Physical Exam: BP 117/76 (BP Location: Left Arm, Patient Position: Sitting,  Cuff Size: Normal)    Pulse 71    Ht 6' (1.829 m)    Wt 212 lb (96.2 kg)    BMI 28.75 kg/m   Constitutional:  Alert and oriented, No acute distress. HEENT: [place] AT, moist mucus membranes.  Trachea midline, no masses. Cardiovascular: No clubbing, cyanosis, or edema. Respiratory: Normal respiratory effort, no increased work of breathing. Skin: No rashes, bruises or suspicious lesions. Neurologic: Grossly intact, no focal deficits, moving all 4 extremities. Psychiatric: Normal mood and affect.  Laboratory Data:  Lab Results  Component Value Date   CREATININE 1.80 (H) 05/14/2020   Urinalysis Urinalysis today grossly positive with many RBCs, WBCs and bacteria.  Culture sent.  Pertinent Imaging:  Results for orders placed during the hospital encounter of 05/14/20  CT HEMATURIA WORKUP  Narrative CLINICAL DATA:  Microhematuria. History of nephrolithiasis. BPH and nocturia.  EXAM: CT ABDOMEN AND PELVIS WITHOUT AND WITH CONTRAST  TECHNIQUE: Multidetector CT imaging of the abdomen and pelvis was performed following the standard protocol before and following the bolus administration of intravenous contrast.  CONTRAST:  125 cc Omnipaque 300 IV.  COMPARISON:  06/28/2019 unenhanced CT abdomen/pelvis.  FINDINGS: Lower chest: No significant pulmonary nodules or acute consolidative airspace disease.  Hepatobiliary: Normal liver size. No liver mass. Cholecystectomy. Bile ducts are stable and within normal post cholecystectomy limits with CBD diameter 7 mm.  Pancreas: Normal, with no mass or duct dilation.  Spleen: Normal size. No mass.  Adrenals/Urinary Tract: Normal adrenals. No renal stones. No hydronephrosis. Simple 6.3 cm anterior upper left renal cyst. Subcentimeter hypodense posterior interpolar right renal cortical lesion is too small to characterize and requires no follow-up. No suspicious renal cortical masses. Normal caliber ureters. No ureteral stones. On delayed  imaging, there is no urothelial wall thickening and there are no filling defects in the opacified portions of the bilateral collecting systems or ureters, noting limited evaluation of pelvic right ureter due to poor opacification. Mild diffuse bladder wall thickening and trabeculation with small 1.3 cm left bladder wall diverticulum. No bladder stones or masses.  Stomach/Bowel: Moderate hiatal hernia. Otherwise normal nondistended stomach. Normal caliber small bowel with no small bowel wall thickening. Normal appendix. Normal large bowel with no diverticulosis, large bowel wall thickening or pericolonic fat stranding.  Vascular/Lymphatic: Atherosclerotic abdominal aorta with stable 3.1 cm infrarenal abdominal aortic aneurysm. Patent portal, splenic, hepatic and renal veins. No pathologically enlarged lymph nodes in the abdomen or pelvis.  Reproductive: Top-normal size prostate with nonspecific coarse internal prostatic calcifications.  Other: No pneumoperitoneum, ascites or focal fluid collection.  Musculoskeletal: No aggressive appearing focal osseous lesions. Moderate lumbar spondylosis.  IMPRESSION: 1. No urolithiasis. No hydronephrosis. No suspicious renal cortical masses. No evidence of urothelial lesions, with limitations as described. 2. Mild diffuse bladder wall thickening and trabeculation with small left bladder  wall diverticulum, suggesting chronic bladder outlet obstruction. 3. Moderate hiatal hernia. 4. Stable 3.1 cm infrarenal abdominal aortic aneurysm. Recommend follow-up every 3 years. This recommendation follows ACR consensus guidelines: White Paper of the ACR Incidental Findings Committee II on Vascular Findings. J Am Coll Radiol 2013; 24:580-998. 5. Aortic Atherosclerosis (ICD10-I70.0).   Electronically Signed By: Delbert Phenix M.D. On: 05/14/2020 13:33  I have personally reviewed the images and agree with radiologist interpretation.    Assessment &  Plan:    1. Microscopic hematuria Patient is considered high risk. Patient was treated with Bactrim for a positive UA 2 weeks ago.  UA today is positive concerning for persistent or ongoing infection-we discussed that possible complications including worsened urinary tract infection and/or sepsis and proceeding at the time of infection.  Additionally, visualization can be suboptimal with multiple false positives if there is any erythema or cystitis.  As such, I like to go ahead and treat him for this infection again and plan for cystoscopy at the end of his treatment course. Urine culture sent-we will send culture specific antibiotics based on the results for total of a 10-day course leading up to cystoscopy.  Patient would like to avoid Bactrim again. Will reschedule cystoscopy.   2. BPH with LUTS Bothersome and worsening symptoms  Currently on maximal medical therapy with Flomax and finasteride Cystoscopy will help elucidate whether or not his prostate is a contributing factor this to this also help rule out stricture disease  3. Nocturia  Worsening As above   [facility] [address], [address] [place], [place] [address] [phone]  I, Theador Hawthorne, am acting as a scribe for Dr. Vanna Scotland.  I have reviewed the above documentation for accuracy and completeness, and I agree with the above.   Vanna Scotland, MD  I spent 30 total minutes on the day of the encounter including pre-visit review of the medical record, face-to-face time with the patient, and post visit ordering of labs/imaging/tests.

## 2020-05-15 ENCOUNTER — Ambulatory Visit (INDEPENDENT_AMBULATORY_CARE_PROVIDER_SITE_OTHER): Payer: Medicare Other | Admitting: Urology

## 2020-05-15 ENCOUNTER — Encounter: Payer: Self-pay | Admitting: Urology

## 2020-05-15 VITALS — BP 117/76 | HR 71 | Ht 72.0 in | Wt 212.0 lb

## 2020-05-15 DIAGNOSIS — R319 Hematuria, unspecified: Secondary | ICD-10-CM

## 2020-05-15 DIAGNOSIS — N390 Urinary tract infection, site not specified: Secondary | ICD-10-CM | POA: Diagnosis not present

## 2020-05-15 DIAGNOSIS — R3129 Other microscopic hematuria: Secondary | ICD-10-CM | POA: Diagnosis not present

## 2020-05-15 LAB — URINALYSIS, COMPLETE
Bilirubin, UA: NEGATIVE
Glucose, UA: NEGATIVE
Ketones, UA: NEGATIVE
Nitrite, UA: NEGATIVE
Specific Gravity, UA: >1.03 — ABNORMAL HIGH (ref 1.005–1.030)
Urobilinogen, Ur: 0.2 mg/dL (ref 0.2–1.0)
pH, UA: 5 (ref 5.0–7.5)

## 2020-05-15 LAB — MICROSCOPIC EXAMINATION: WBC, UA: >30 /hpf — AB (ref 0–5)

## 2020-05-18 LAB — CULTURE, URINE COMPREHENSIVE

## 2020-05-19 ENCOUNTER — Encounter: Payer: Self-pay | Admitting: Urology

## 2020-05-21 ENCOUNTER — Ambulatory Visit (INDEPENDENT_AMBULATORY_CARE_PROVIDER_SITE_OTHER): Payer: Medicare Other | Admitting: Physician Assistant

## 2020-05-21 ENCOUNTER — Encounter: Payer: Self-pay | Admitting: Physician Assistant

## 2020-05-21 ENCOUNTER — Other Ambulatory Visit: Payer: Self-pay

## 2020-05-21 VITALS — HR 73 | Ht 72.0 in | Wt 210.0 lb

## 2020-05-21 DIAGNOSIS — R351 Nocturia: Secondary | ICD-10-CM

## 2020-05-21 DIAGNOSIS — R3129 Other microscopic hematuria: Secondary | ICD-10-CM

## 2020-05-21 NOTE — Progress Notes (Signed)
05/21/2020 4:58 PM   Keith Craig. 06-02-1946 [id number]  CC: Chief Complaint  Patient presents with  . Urinary Frequency    HPI: Keith Craig. is a 74 y.o. male with a history of BPH with LUTS, nocturia, and microscopic hematuria who presents today for urine recheck.  He is scheduled for cystoscopy with Dr. Apolinar Junes in 9 days for evaluation of microscopic hematuria.  She planned to treat him with 10 days of culture appropriate antibiotics in advance of this procedure, however urine culture from that visit ultimately finalized with mixed urogenital flora.  Today patient reports ongoing gross hematuria and nocturia without dysuria.  He reports urinating only about twice during the day, but nearly every hour overnight. He reports that he snores and his snoring frequently awakens both himself and his family members.  He has never undergone a sleep study for evaluation of sleep apnea.  He also has a history of failed catheterizations.  In-office UA today positive for trace ketones, 3+ blood, 1+ protein, and 1+ leukocyte esterase; urine microscopy with >30 WBCs/HPF, >30 RBCs/HPF, and few bacteria  PMH: Past Medical History:  Diagnosis Date  . Arthritis   . Benign essential microscopic hematuria   . BPH with obstruction/lower urinary tract symptoms   . Chills   . Chronic airway obstruction (HCC)   . Chronic obstructive asthma (HCC)   . Esophageal reflux   . HTN (hypertension)   . Over weight     Surgical History: Past Surgical History:  Procedure Laterality Date  . cataract surgery Bilateral   . CHOLECYSTECTOMY    . HIATAL HERNIA REPAIR    . microvascular decompression of left trigeminal nerve    . RHIZOTOMY    . small bowel restrictions    . URETHRAL STRICTURE DILATATION      Home Medications:  Allergies as of 05/21/2020      Reactions   Vitamin B12 [cyanocobalamin] Itching, Rash      Medication List       Accurate as of May 21, 2020  4:58 PM. If  you have any questions, ask your nurse or doctor.        STOP taking these medications   sulfamethoxazole-trimethoprim 800-160 MG tablet Commonly known as: BACTRIM DS Stopped by: Carman Ching, PA-C     TAKE these medications   Advair Diskus 250-50 MCG/DOSE Aepb Generic drug: Fluticasone-Salmeterol INHALE 1 PUFF BY MOUTH EVERY 12 HOURS   albuterol 108 (90 Base) MCG/ACT inhaler Commonly known as: VENTOLIN HFA Inhale 2 puffs into the lungs every 6 (six) hours as needed for wheezing or shortness of breath.   cloNIDine 0.2 MG tablet Commonly known as: CATAPRES Take 0.2 mg by mouth 2 (two) times daily.   finasteride 5 MG tablet Commonly known as: PROSCAR Take 1 tablet (5 mg total) by mouth daily.   pantoprazole 40 MG tablet Commonly known as: PROTONIX Take 40 mg by mouth 2 (two) times daily.   predniSONE 2.5 MG tablet Commonly known as: DELTASONE Take 7.5 mg by mouth daily.   tamsulosin 0.4 MG Caps capsule Commonly known as: FLOMAX Take 1 capsule (0.4 mg total) by mouth daily.   valsartan 80 MG tablet Commonly known as: DIOVAN Take 80 mg by mouth 2 (two) times daily.       Allergies:  Allergies  Allergen Reactions  . Vitamin B12 [Cyanocobalamin] Itching and Rash    Family History: Family History  Problem Relation Age of Onset  . Arthritis/Rheumatoid Father   .  Kidney disease Neg Hx   . Prostate cancer Neg Hx   . Kidney cancer Neg Hx   . Bladder Cancer Neg Hx     Social History:   reports that he quit smoking about 50 years ago. He has never used smokeless tobacco. He reports that he does not drink alcohol and does not use drugs.  Physical Exam: Pulse 73   Ht 6' (1.829 m)   Wt 210 lb (95.3 kg)   BMI 28.48 kg/m   Constitutional:  Alert and oriented, no acute distress, nontoxic appearing HEENT: [place], AT Cardiovascular: No clubbing, cyanosis, or edema Respiratory: Normal respiratory effort, no increased work of breathing Skin: No rashes, bruises  or suspicious lesions Neurologic: Grossly intact, no focal deficits, moving all 4 extremities Psychiatric: Normal mood and affect  Laboratory Data: Results for orders placed or performed in visit on 05/21/20  Microscopic Examination   Urine  Result Value Ref Range   WBC, UA >30 (A) 0 - 5 /hpf   RBC >30 (A) 0 - 2 /hpf   Epithelial Cells (non renal) 0-10 0 - 10 /hpf   Renal Epithel, UA 0-10 (A) None seen /hpf   Bacteria, UA Few None seen/Few  Urinalysis, Complete  Result Value Ref Range   Specific Gravity, UA >1.030 (H) 1.005 - 1.030   pH, UA 5.0 5.0 - 7.5   Color, UA Orange Yellow   Appearance Ur Cloudy (A) Clear   Leukocytes,UA 1+ (A) Negative   Protein,UA 1+ (A) Negative/Trace   Glucose, UA Negative Negative   Ketones, UA Trace (A) Negative   RBC, UA 3+ (A) Negative   Bilirubin, UA Negative Negative   Urobilinogen, Ur 0.2 0.2 - 1.0 mg/dL   Nitrite, UA Negative Negative   Microscopic Examination See below:    Assessment & Plan:   1. Microscopic hematuria Deferred cath UA sample today given his history of failed catheterizations. Obtained voided sample today for retesting; will send for culture and treat per results. - Urinalysis, Complete - CULTURE, URINE COMPREHENSIVE  2. Nocturia Recommend sleep study for evaluation of possible OSA, which is a likely etiology of his isolated nighttime frequency. Recommend this be ordered by PCP.  Return for as scheduled.  Carman Ching, PA-C  [facility] [address], [address] [place], [place] [address] [phone]

## 2020-05-22 LAB — MICROSCOPIC EXAMINATION
RBC, Urine: >30 /hpf — AB (ref 0–2)
WBC, UA: >30 /hpf — AB (ref 0–5)

## 2020-05-22 LAB — URINALYSIS, COMPLETE
Bilirubin, UA: NEGATIVE
Glucose, UA: NEGATIVE
Nitrite, UA: NEGATIVE
Specific Gravity, UA: >1.03 — ABNORMAL HIGH (ref 1.005–1.030)
Urobilinogen, Ur: 0.2 mg/dL (ref 0.2–1.0)
pH, UA: 5 (ref 5.0–7.5)

## 2020-05-25 LAB — CULTURE, URINE COMPREHENSIVE

## 2020-05-29 NOTE — Progress Notes (Signed)
   05/30/2020  CC:  Chief Complaint  Patient presents with  . Cysto    OIZ:TIWPY H Keith Craig. is a 74 y.o. male who presents today for a cystoscopy.  CT hematuria work up on 05/14/2020 revealed no urolithiasis. No hydronephrosis. No suspicious renal cortical masses. No evidence of urothelial lesions, with limitations as described. Mild diffuse bladder wall thickening and trabeculation with small left bladder wall diverticulum, suggesting chronic bladder outlet obstruction. Moderate hiatal hernia. Stable 3.1 cm infrarenal abdominal aortic aneurysm.  Patient was treated for a positive UA in 04/2020. Bactrim course did not improve his symptoms. Denied burning sensation or pain. He noted an abnormal urine color x 1 month. He reported worsening nocturia. Denied fevers but notes chills a few nights ago.   He reported chills were a symptoms he usually gets with UTI's. He reported having several UTI in the past.   He had some foley cath trauma. He had a failed cystoscopy with Dr. Retta Diones in 2012 intraoperatively after what sounds like Foley catheter trauma.  It was unclear whether he had follow-up cystoscopy thereafter.  Patient was treated with 10 days in advanced prior to procedure with culture appropriate antibiotic.    Blood pressure (!) 181/114, pulse 66, height 6' (1.829 m), weight 210 lb (95.3 kg). NED. A&Ox3.   No respiratory distress   Abd soft, NT, ND Normal phallus with bilateral descended testicles  Cystoscopy Procedure Note  Patient identification was confirmed, informed consent was obtained, and patient was prepped using Betadine solution.  Lidocaine jelly was administered per urethral meatus.     Pre-Procedure: - Inspection reveals a normal caliber ureteral meatus.  Procedure: The flexible cystoscope was introduced with difficulty. There were multiple concentric rings of strictures pendulus urethra. Dense white bulbar urethra with 8 FR in diameter. Unable to pass  scope beyond this point.     Post-Procedure: - Patient tolerated the procedure well  Assessment/ Plan:  1. Bulbar urethral strictures  Recommend DVIU vs urethral dilation with RUG in the OR and/or referral to [facility] for reconstruction. Risks and benefits of each along with short and long-term efficacy rates. He is most interested in DVIU/ urethral dilation.  Plan for RUG at the same time to assess the overall length and severity of stricture.  I understand she'll need a catheter currently 72 hours postoperatively.  Recurrence rates discussed.  Risk of bleeding, infection, demonstrating structures amongst others were all discussed. Patient agrees with plan and elected DVIU.   Georges Mouse, am acting as a scribe for Dr. Vanna Scotland.  I have reviewed the above documentation for accuracy and completeness, and I agree with the above.   Vanna Scotland, MD

## 2020-05-30 ENCOUNTER — Other Ambulatory Visit: Payer: Self-pay

## 2020-05-30 ENCOUNTER — Ambulatory Visit (INDEPENDENT_AMBULATORY_CARE_PROVIDER_SITE_OTHER): Payer: Medicare Other | Admitting: Urology

## 2020-05-30 VITALS — BP 181/114 | HR 66 | Ht 72.0 in | Wt 210.0 lb

## 2020-05-30 DIAGNOSIS — R3129 Other microscopic hematuria: Secondary | ICD-10-CM

## 2020-05-30 NOTE — Patient Instructions (Signed)
Urethrotomy  Urethrotomy is a surgery to treat a section of the urethra that is too narrow (urethral stricture). The urethra is the part of the body that drains urine from the bladder out of the body. Urethral stricture makes it difficult or painful to urinate, and it can increase your risk of having more frequent urinary tract infections (UTIs). The goal of surgery is to open the urethral stricture and restore the normal flow of urine. Urethrotomy is performed by passing a thin tube with a light and tiny camera on the end (cystoscope) into the urethra. An instrument is used to cut the stricture, which widens the urethra. Tell a health care provider about:  Any allergies you have.  All medicines you are taking, including vitamins, herbs, eye drops, creams, and over-the-counter medicines.  Any problems you or family members have had with anesthetic medicines.  Any blood disorders you have.  Any surgeries you have had.  Any medical conditions you have.  Whether you are pregnant or may be pregnant. What are the risks? Generally, this is a safe procedure. However, problems may occur, including:  Infection.  Bleeding.  Damage to nearby structures or organs.  Urethral stricture coming back after surgery.  Inability to get or keep an erection (erectile dysfunction) in men.  Blood clots. What happens before the procedure? Staying hydrated Follow instructions from your health care provider about hydration, which may include:  Up to 2 hours before the procedure - you may continue to drink clear liquids, such as water, clear fruit juice, black coffee, and plain tea. Eating and drinking restrictions Follow instructions from your health care provider about eating and drinking, which may include:  8 hours before the procedure - stop eating heavy meals or foods, such as meat, fried foods, or fatty foods.  6 hours before the procedure - stop eating light meals or foods, such as toast or  cereal.  6 hours before the procedure - stop drinking milk or drinks that contain milk.  2 hours before the procedure - stop drinking clear liquids. Medicines Ask your health care provider about:  Changing or stopping your regular medicines. This is especially important if you are taking diabetes medicines or blood thinners.  Taking medicines such as aspirin and ibuprofen. These medicines can thin your blood. Do not take these medicines unless your health care provider tells you to take them.  Taking over-the-counter medicines, vitamins, herbs, and supplements. Tests You will have an exam or testing, including:  A complete physical exam.  Blood or urine tests.  X-ray and electrocardiogram, or ECG. General instructions  Ask your health care provider what steps will be taken to help prevent infection. These may include: ? Washing your genital area with a germ-killing soap. ? Taking antibiotic medicine.  You may be asked to shower with a germ-killing soap.  Plan to have someone take you home from the hospital or clinic.  If you will be going home right after the procedure, plan to have someone with you for 24 hours. What happens during the procedure?  An IV will be inserted into one of your veins.  You will be given one or more of the following: ? A medicine to help you relax (sedative). ? A medicine to make you fall asleep (general anesthetic). ? A medicine that is injected into your spine to numb the area below and slightly above the injection site (spinal anesthetic).  The cystoscope will be inserted into the urethra.  An incision will be  made in the urethral stricture with a knife or a laser. This incision will allow urine to pass through the stricture.  A thin, flexible tube (catheter) may be inserted through your urethra and into your bladder to hold the incision open while it heals. The catheter will help drain your urine.  A bandage (dressing) may be placed over  the opening of your urethra. The procedure may vary among health care providers and hospitals. What happens after the procedure?   Your blood pressure, heart rate, breathing rate, and blood oxygen level will be monitored until you leave the hospital or clinic.  You will continue to have a catheter draining your urine. The catheter may be left in for a few days or weeks, depending on the size of the stricture. ? You will be shown how to care for your catheter. ? You may have some blood leaking from around the catheter when you urinate.  Do not drive for 24 hours if you were given a sedative during the procedure.  You may have to wear compression stockings. These stockings help to prevent blood clots and reduce swelling in your legs. Summary  Urethrotomy is a surgery to treat a section of the urethra that is too narrow (urethral stricture).  This procedure is done by passing a thin tube with a light and tiny camera on the end (cystoscope) into the urethra. An instrument is used to cut the stricture, which widens the urethra.  A thin, flexible tube (catheter) may be inserted through your urethra and into your bladder to hold the incision open while it heals. The catheter will help drain your urine.  The catheter may be left in for a few days or weeks after the procedure, depending on the size of the stricture. This information is not intended to replace advice given to you by your health care provider. Make sure you discuss any questions you have with your health care provider. Document Revised: 01/31/2019 Document Reviewed: 01/31/2019 Elsevier Patient Education  2020 [employer].

## 2020-05-31 ENCOUNTER — Other Ambulatory Visit: Payer: Self-pay | Admitting: Radiology

## 2020-05-31 DIAGNOSIS — N35919 Unspecified urethral stricture, male, unspecified site: Secondary | ICD-10-CM

## 2020-05-31 LAB — URINALYSIS, COMPLETE
Bilirubin, UA: NEGATIVE
Glucose, UA: NEGATIVE
Ketones, UA: NEGATIVE
Nitrite, UA: NEGATIVE
Specific Gravity, UA: 1.025 (ref 1.005–1.030)
Urobilinogen, Ur: 0.2 mg/dL (ref 0.2–1.0)
pH, UA: 7 (ref 5.0–7.5)

## 2020-05-31 LAB — MICROSCOPIC EXAMINATION
RBC, Urine: >30 /hpf — AB (ref 0–2)
WBC, UA: >30 /hpf — AB (ref 0–5)

## 2020-06-05 ENCOUNTER — Encounter: Payer: Self-pay | Admitting: Radiology

## 2020-06-06 ENCOUNTER — Other Ambulatory Visit: Payer: Self-pay | Admitting: Radiology

## 2020-06-06 ENCOUNTER — Other Ambulatory Visit: Payer: Self-pay

## 2020-06-06 ENCOUNTER — Encounter
Admission: RE | Admit: 2020-06-06 | Discharge: 2020-06-06 | Disposition: A | Discharge status: Home / Self Care | Payer: Medicare Other | Source: Ambulatory Visit | Attending: Urology | Admitting: Urology

## 2020-06-06 DIAGNOSIS — R319 Hematuria, unspecified: Secondary | ICD-10-CM

## 2020-06-06 DIAGNOSIS — N390 Urinary tract infection, site not specified: Secondary | ICD-10-CM

## 2020-06-06 LAB — CULTURE, URINE COMPREHENSIVE

## 2020-06-06 MED ORDER — SULFAMETHOXAZOLE-TRIMETHOPRIM 800-160 MG PO TABS: 1.0000 | ORAL_TABLET | Freq: Two times a day (BID) | ORAL | 0 refills | Status: DC

## 2020-06-06 NOTE — Telephone Encounter (Signed)
LMOM that script was sent to pharmacy. 

## 2020-06-06 NOTE — Patient Instructions (Signed)
Your procedure is scheduled on: 06/11/20 Report to [facility]. To find out your arrival time please call [phone] between 1PM - 3PM on 06/08/20.  Remember: Instructions that are not followed completely may result in serious medical risk, up to and including death, or upon the discretion of your surgeon and anesthesiologist your surgery may need to be rescheduled.     _X__ 1. Do not eat food after midnight the night before your procedure.                 No gum chewing or hard candies. You may drink clear liquids up to 2 hours                 before you are scheduled to arrive for your surgery- DO not drink clear                 liquids within 2 hours of the start of your surgery.                 Clear Liquids include:  water, apple juice without pulp, clear carbohydrate                 drink such as Clearfast or Gatorade, Black Coffee or Tea (Do not add                 anything to coffee or tea). Diabetics water only  __X__2.  On the morning of surgery brush your teeth with toothpaste and water, you                 may rinse your mouth with mouthwash if you wish.  Do not swallow any              toothpaste of mouthwash.     _X__ 3.  No Alcohol for 24 hours before or after surgery.   _X__ 4.  Do Not Smoke or use e-cigarettes For 24 Hours Prior to Your Surgery.                 Do not use any chewable tobacco products for at least 6 hours prior to                 surgery.  ____  5.  Bring all medications with you on the day of surgery if instructed.   __X__  6.  Notify your doctor if there is any change in your medical condition      (cold, fever, infections).     Do not wear jewelry, make-up, hairpins, clips or nail polish. Do not wear lotions, powders, or perfumes.  Do not shave 48 hours prior to surgery. Men may shave face and neck. Do not bring valuables to the hospital.    [facility] is not responsible for any belongings  or valuables.  Contacts, dentures/partials or body piercings may not be worn into surgery. Bring a case for your contacts, glasses or hearing aids, a denture cup will be supplied. Leave your suitcase in the car. After surgery it may be brought to your room. For patients admitted to the hospital, discharge time is determined by your treatment team.   Patients discharged the day of surgery will not be allowed to drive home.   Please read over the following fact sheets that you were given:   MRSA Information  __X__ Take these medicines the morning of surgery with A SIP OF WATER:  1. cloNIDine (CATAPRES) 0.2 MG tablet  2. finasteride (PROSCAR) 5 MG tablet  3. pantoprazole (PROTONIX) 20 MG tablet  4. predniSONE (DELTASONE) 5 MG tablet  5. tamsulosin (FLOMAX) 0.4 MG CAPS capsule  6.  ____ Fleet Enema (as directed)   ____ Use CHG Soap/SAGE wipes as directed  __X__ Use inhalers on the day of surgery  ____ Stop metformin/Janumet/Farxiga 2 days prior to surgery    ____ Take 1/2 of usual insulin dose the night before surgery. No insulin the morning          of surgery.   ____ Stop Blood Thinners Coumadin/Plavix/Xarelto/Pleta/Pradaxa/Eliquis/Effient/Aspirin  on   Or contact your Surgeon, Cardiologist or Medical Doctor regarding  ability to stop your blood thinners  __X__ Stop Anti-inflammatories 7 days before surgery such as Advil, Ibuprofen, Motrin,  BC or Goodies Powder, Naprosyn, Naproxen, Aleve, Aspirin    __X__ Stop all herbal supplements, fish oil or vitamin E until after surgery.    ____ Bring C-Pap to the hospital.

## 2020-06-06 NOTE — Telephone Encounter (Signed)
-----   Message from Vanna Scotland, MD sent at 06/05/2020  4:56 PM EDT ----- Out of the upmost precaution, I like to start him on Bactrim again DS starting 3 days before his procedure for total of 5 days.  Vanna Scotland, MD

## 2020-06-07 ENCOUNTER — Other Ambulatory Visit
Admission: RE | Admit: 2020-06-07 | Discharge: 2020-06-07 | Disposition: A | Discharge status: Home / Self Care | Payer: Medicare Other | Source: Ambulatory Visit | Attending: Urology | Admitting: Urology

## 2020-06-07 DIAGNOSIS — Z01812 Encounter for preprocedural laboratory examination: Secondary | ICD-10-CM | POA: Insufficient documentation

## 2020-06-07 DIAGNOSIS — Z20822 Contact with and (suspected) exposure to covid-19: Secondary | ICD-10-CM | POA: Insufficient documentation

## 2020-06-08 ENCOUNTER — Encounter: Payer: Self-pay | Admitting: Urology

## 2020-06-08 LAB — SARS CORONAVIRUS 2 (TAT 6-24 HRS): SARS Coronavirus 2: NEGATIVE

## 2020-06-09 ENCOUNTER — Encounter: Payer: Self-pay | Admitting: Urology

## 2020-06-11 ENCOUNTER — Other Ambulatory Visit: Payer: Self-pay | Admitting: Radiology

## 2020-06-11 ENCOUNTER — Other Ambulatory Visit: Payer: Self-pay

## 2020-06-11 ENCOUNTER — Encounter: Payer: Self-pay | Admitting: Radiology

## 2020-06-11 DIAGNOSIS — R319 Hematuria, unspecified: Secondary | ICD-10-CM

## 2020-06-11 DIAGNOSIS — N390 Urinary tract infection, site not specified: Secondary | ICD-10-CM

## 2020-06-11 MED ORDER — CIPROFLOXACIN HCL 500 MG PO TABS: 500.0000 mg | ORAL_TABLET | Freq: Two times a day (BID) | ORAL | 0 refills | Status: DC

## 2020-06-11 NOTE — Telephone Encounter (Signed)
Patient states he was seen by Dr Judithann Sheen at [facility] today who collected a urine sample that "was not good". Per Shirlee Limerick at [facility] urine culture was collected and no antibiotic was prescribed at this time. Patient would like to start an antibiotic other than Bactrim to treat infection. Please advise.

## 2020-06-11 NOTE — Telephone Encounter (Signed)
Notified patient of script sent to pharmacy and to begin medication on 06/22/2020. Patient is concerned about hematuria and waiting to treat the infection. Attempted to reassure patient without success. Please return call to [phone].

## 2020-06-11 NOTE — Telephone Encounter (Signed)
Patient called to postpone surgery. He was unable to continue antibiotic due to side effects and is seeing PCP today due to illness. Will reschedule for 06/25/2020. Patient aware.

## 2020-06-11 NOTE — Telephone Encounter (Signed)
Spoke with patient and reassured him that blood in the urine is not concerning at this time. He has a stricture and is post cysto in office that can contribute to hematuria. Patient describes blood as light pink at times or dark brown but not all the time. Patient was also not able to keep fluids down this past weekend and was most likely dehydrated which can also darken urine. He was instructed to call back if urine became wine/ketchup colored or thick clotted blood. Patient verbalized understanding. Patient also states that [facility] is unable to fill [facility] due to back order, script was sent to local [facility] for patient to fill.

## 2020-06-12 ENCOUNTER — Encounter: Payer: Self-pay | Admitting: Urology

## 2020-06-12 ENCOUNTER — Other Ambulatory Visit: Payer: Self-pay

## 2020-06-12 ENCOUNTER — Ambulatory Visit (INDEPENDENT_AMBULATORY_CARE_PROVIDER_SITE_OTHER): Payer: Medicare Other | Admitting: Urology

## 2020-06-12 VITALS — BP 139/81 | HR 73 | Ht 72.0 in | Wt 210.0 lb

## 2020-06-12 DIAGNOSIS — N390 Urinary tract infection, site not specified: Secondary | ICD-10-CM

## 2020-06-12 DIAGNOSIS — R319 Hematuria, unspecified: Secondary | ICD-10-CM

## 2020-06-12 NOTE — H&P (View-Only) (Signed)
06/13/2020 10:51 AM   Keith Craig [birth year] [id number]  Referring provider: Marguarite Arbour, MD [address] [facility] [place],  [place] [address] Chief Complaint  Patient presents with  . Follow-up    surgery discussion    HPI: Keith Dirk. is a 74 y.o. male who returns today for discussion of DVIU regarding his severe urethral strictures, hematuria and urinary symptoms.   CT hematuria work up on 05/14/2020 revealed no urolithiasis. No hydronephrosis. No suspicious renal cortical masses. No evidence of urothelial lesions, with limitations as described. Mild diffuse bladder wall thickening and trabeculation with small left bladder wall diverticulum, suggesting chronic bladder outlet obstruction. Moderate hiatal hernia. Stable 3.1 cm infrarenal abdominal aortic aneurysm.  Patient was supposed to have DVIU on 06/11/2020. He was placed on Bactrim prior to procedure as a precaution in the setting of ongoing urinary symptoms and fairly low colony count of bacteriuria as a precaution.  The patient reportedly took 2 dose of Bactrim and started feeling sick.   He also has a personal history of gross hematuria which started ever since this started ever since his attempted cystoscopy.  He went to his PCP yesterday. He had nausea, vomiting, chills with tremors and excessive sweating. He felt fatigued with generalized weakness. He felt achy. UA showed large blood, moderate leukocytes, >50 RBC, >50 WBC and a few bacteria.   Overall, symptoms are improving.  He is frustrated because he tried to reach [place] over the weekend to let us know what was going on rather than canceled the morning of surgery.  He is somewhat argumentative today.  PMH: Past Medical History:  Diagnosis Date  . Arthritis   . Benign essential microscopic hematuria   . BPH with obstruction/lower urinary tract symptoms   . Chills   . Chronic airway obstruction (HCC)   . Chronic obstructive  asthma (HCC)   . Esophageal reflux   . HTN (hypertension)   . Over weight     Surgical History: Past Surgical History:  Procedure Laterality Date  . cataract surgery Bilateral   . CHOLECYSTECTOMY    . HIATAL HERNIA REPAIR    . microvascular decompression of left trigeminal nerve    . RHIZOTOMY    . small bowel restrictions    . URETHRAL STRICTURE DILATATION      Home Medications:  Allergies as of 06/12/2020      Reactions   Bactrim [sulfamethoxazole-trimethoprim] Nausea And Vomiting   Vitamin B12 [cyanocobalamin] Itching, Rash   Injection, tolerates the tablets       Medication List       Accurate as of June 12, 2020 10:51 AM. If you have any questions, ask your nurse or doctor.        acetaminophen 500 MG tablet Commonly known as: TYLENOL Take 1,000 mg by mouth every 6 (six) hours as needed for moderate pain or headache.   albuterol 108 (90 Base) MCG/ACT inhaler Commonly known as: VENTOLIN HFA Inhale 2 puffs into the lungs every 6 (six) hours as needed for wheezing or shortness of breath.   ciprofloxacin 500 MG tablet Commonly known as: CIPRO Take 1 tablet (500 mg total) by mouth every 12 (twelve) hours. Begin taking medication on 06/22/2020. Start taking on: June 22, 2020   cloNIDine 0.2 MG tablet Commonly known as: CATAPRES Take 0.2 mg by mouth 2 (two) times daily.   finasteride 5 MG tablet Commonly known as: PROSCAR Take 1 tablet (5 mg total) by  mouth daily.   fluticasone 50 MCG/ACT nasal spray Commonly known as: FLONASE Place 1 spray into both nostrils daily as needed for allergies or rhinitis.   Fluticasone-Salmeterol 500-50 MCG/DOSE Aepb Commonly known as: ADVAIR Inhale 1 puff into the lungs 2 (two) times daily.   montelukast 10 MG tablet Commonly known as: SINGULAIR Take 10 mg by mouth at bedtime.   pantoprazole 20 MG tablet Commonly known as: PROTONIX Take 20 mg by mouth 2 (two) times daily.   predniSONE 5 MG tablet Commonly known  as: DELTASONE Take 5 mg by mouth daily with breakfast.   sulfamethoxazole-trimethoprim 800-160 MG tablet Commonly known as: BACTRIM DS Take 1 tablet by mouth every 12 (twelve) hours. Begin medication on 06/08/2020.   tamsulosin 0.4 MG Caps capsule Commonly known as: FLOMAX Take 1 capsule (0.4 mg total) by mouth daily.   valsartan 80 MG tablet Commonly known as: DIOVAN Take 80 mg by mouth 2 (two) times daily.       Allergies:  Allergies  Allergen Reactions  . Bactrim [Sulfamethoxazole-Trimethoprim] Nausea And Vomiting  . Vitamin B12 [Cyanocobalamin] Itching and Rash    Injection, tolerates the tablets     Family History: Family History  Problem Relation Age of Onset  . Arthritis/Rheumatoid Father   . Kidney disease Neg Hx   . Prostate cancer Neg Hx   . Kidney cancer Neg Hx   . Bladder Cancer Neg Hx     Social History:  reports that he quit smoking about 50 years ago. He has never used smokeless tobacco. He reports that he does not drink alcohol and does not use drugs.   Physical Exam: BP 139/81   Pulse 73   Ht 6' (1.829 m)   Wt 210 lb (95.3 kg)   BMI 28.48 kg/m   Constitutional:  Alert and oriented, No acute distress. HEENT: [place] AT, moist mucus membranes.  Trachea midline, no masses. Cardiovascular: No clubbing, cyanosis, or edema. Respiratory: Normal respiratory effort, no increased work of breathing. Skin: No rashes, bruises or suspicious lesions. Neurologic: Grossly intact, no focal deficits, moving all 4 extremities. Psychiatric: Normal mood and affect.  Laboratory Data:  Lab Results  Component Value Date   CREATININE 1.80 (H) 05/14/2020    Urinalysis Urine culture obtained, pending.  Urinalysis from PCP yesterday reviewed, no culture appears to have been sent.  Assessment & Plan:    1. Bulbar urethral strictures Will repeat urine culture today. Will give patient Cipro 3 days prior to DVIU as a anaphylaxis.   We did have a lengthy discussion  today about the importance of coming to surgery on the day of surgery.  If he has any concerns, we can address him in the preoperative holding area, send labs, assess, etc.  Putting off the surgery further will increase his risk of having UTIs, retention, and ongoing urinary issues.  He understands all of this.  Previous cultures have been essentially negative, very low colony count.  No culture was sent yesterday.  We will plan to repeat this.  I like him to hold off on starting antibiotics until a few days before surgery with the goal of reducing his overall bacteria load rather than completely eradicating his bacteria.  We will call him if his culture ends up growing something more significant.  We will plan to change his antibiotics from Bactrim to Cipro given his intolerance of Bactrim.    [facility] [address], [address] [place], [place] [address] [phone]  I,  Theador Hawthorne, am acting as a scribe for Dr. Vanna Scotland.  I have reviewed the above documentation for accuracy and completeness, and I agree with the above.   Vanna Scotland, MD

## 2020-06-12 NOTE — Progress Notes (Signed)
06/13/2020 10:51 AM   Keith Craig [birth year] [id number]  Referring provider: Marguarite Arbour, MD [address] [facility] [place],  [place] [address] Chief Complaint  Patient presents with  . Follow-up    surgery discussion    HPI: Keith Dirk. is a 74 y.o. male who returns today for discussion of DVIU regarding his severe urethral strictures, hematuria and urinary symptoms.   CT hematuria work up on 05/14/2020 revealed no urolithiasis. No hydronephrosis. No suspicious renal cortical masses. No evidence of urothelial lesions, with limitations as described. Mild diffuse bladder wall thickening and trabeculation with small left bladder wall diverticulum, suggesting chronic bladder outlet obstruction. Moderate hiatal hernia. Stable 3.1 cm infrarenal abdominal aortic aneurysm.  Patient was supposed to have DVIU on 06/11/2020. He was placed on Bactrim prior to procedure as a precaution in the setting of ongoing urinary symptoms and fairly low colony count of bacteriuria as a precaution.  The patient reportedly took 2 dose of Bactrim and started feeling sick.   He also has a personal history of gross hematuria which started ever since this started ever since his attempted cystoscopy.  He went to his PCP yesterday. He had nausea, vomiting, chills with tremors and excessive sweating. He felt fatigued with generalized weakness. He felt achy. UA showed large blood, moderate leukocytes, >50 RBC, >50 WBC and a few bacteria.   Overall, symptoms are improving.  He is frustrated because he tried to reach [place] over the weekend to let us know what was going on rather than canceled the morning of surgery.  He is somewhat argumentative today.  PMH: Past Medical History:  Diagnosis Date  . Arthritis   . Benign essential microscopic hematuria   . BPH with obstruction/lower urinary tract symptoms   . Chills   . Chronic airway obstruction (HCC)   . Chronic obstructive  asthma (HCC)   . Esophageal reflux   . HTN (hypertension)   . Over weight     Surgical History: Past Surgical History:  Procedure Laterality Date  . cataract surgery Bilateral   . CHOLECYSTECTOMY    . HIATAL HERNIA REPAIR    . microvascular decompression of left trigeminal nerve    . RHIZOTOMY    . small bowel restrictions    . URETHRAL STRICTURE DILATATION      Home Medications:  Allergies as of 06/12/2020      Reactions   Bactrim [sulfamethoxazole-trimethoprim] Nausea And Vomiting   Vitamin B12 [cyanocobalamin] Itching, Rash   Injection, tolerates the tablets       Medication List       Accurate as of June 12, 2020 10:51 AM. If you have any questions, ask your nurse or doctor.        acetaminophen 500 MG tablet Commonly known as: TYLENOL Take 1,000 mg by mouth every 6 (six) hours as needed for moderate pain or headache.   albuterol 108 (90 Base) MCG/ACT inhaler Commonly known as: VENTOLIN HFA Inhale 2 puffs into the lungs every 6 (six) hours as needed for wheezing or shortness of breath.   ciprofloxacin 500 MG tablet Commonly known as: CIPRO Take 1 tablet (500 mg total) by mouth every 12 (twelve) hours. Begin taking medication on 06/22/2020. Start taking on: June 22, 2020   cloNIDine 0.2 MG tablet Commonly known as: CATAPRES Take 0.2 mg by mouth 2 (two) times daily.   finasteride 5 MG tablet Commonly known as: PROSCAR Take 1 tablet (5 mg total) by  mouth daily.   fluticasone 50 MCG/ACT nasal spray Commonly known as: FLONASE Place 1 spray into both nostrils daily as needed for allergies or rhinitis.   Fluticasone-Salmeterol 500-50 MCG/DOSE Aepb Commonly known as: ADVAIR Inhale 1 puff into the lungs 2 (two) times daily.   montelukast 10 MG tablet Commonly known as: SINGULAIR Take 10 mg by mouth at bedtime.   pantoprazole 20 MG tablet Commonly known as: PROTONIX Take 20 mg by mouth 2 (two) times daily.   predniSONE 5 MG tablet Commonly known  as: DELTASONE Take 5 mg by mouth daily with breakfast.   sulfamethoxazole-trimethoprim 800-160 MG tablet Commonly known as: BACTRIM DS Take 1 tablet by mouth every 12 (twelve) hours. Begin medication on 06/08/2020.   tamsulosin 0.4 MG Caps capsule Commonly known as: FLOMAX Take 1 capsule (0.4 mg total) by mouth daily.   valsartan 80 MG tablet Commonly known as: DIOVAN Take 80 mg by mouth 2 (two) times daily.       Allergies:  Allergies  Allergen Reactions  . Bactrim [Sulfamethoxazole-Trimethoprim] Nausea And Vomiting  . Vitamin B12 [Cyanocobalamin] Itching and Rash    Injection, tolerates the tablets     Family History: Family History  Problem Relation Age of Onset  . Arthritis/Rheumatoid Father   . Kidney disease Neg Hx   . Prostate cancer Neg Hx   . Kidney cancer Neg Hx   . Bladder Cancer Neg Hx     Social History:  reports that he quit smoking about 50 years ago. He has never used smokeless tobacco. He reports that he does not drink alcohol and does not use drugs.   Physical Exam: BP 139/81   Pulse 73   Ht 6' (1.829 m)   Wt 210 lb (95.3 kg)   BMI 28.48 kg/m   Constitutional:  Alert and oriented, No acute distress. HEENT:  AT, moist mucus membranes.  Trachea midline, no masses. Cardiovascular: No clubbing, cyanosis, or edema. Respiratory: Normal respiratory effort, no increased work of breathing. Skin: No rashes, bruises or suspicious lesions. Neurologic: Grossly intact, no focal deficits, moving all 4 extremities. Psychiatric: Normal mood and affect.  Laboratory Data:  Lab Results  Component Value Date   CREATININE 1.80 (H) 05/14/2020    Urinalysis Urine culture obtained, pending.  Urinalysis from PCP yesterday reviewed, no culture appears to have been sent.  Assessment & Plan:    1. Bulbar urethral strictures Will repeat urine culture today. Will give patient Cipro 3 days prior to DVIU as a anaphylaxis.   We did have a lengthy discussion  today about the importance of coming to surgery on the day of surgery.  If he has any concerns, we can address him in the preoperative holding area, send labs, assess, etc.  Putting off the surgery further will increase his risk of having UTIs, retention, and ongoing urinary issues.  He understands all of this.  Previous cultures have been essentially negative, very low colony count.  No culture was sent yesterday.  We will plan to repeat this.  I like him to hold off on starting antibiotics until a few days before surgery with the goal of reducing his overall bacteria load rather than completely eradicating his bacteria.  We will call him if his culture ends up growing something more significant.  We will plan to change his antibiotics from Bactrim to Cipro given his intolerance of Bactrim.    [facility] [address], [address] [place], [place] [address] [phone]  I,  Theador Hawthorne, am acting as a scribe for Dr. Vanna Scotland.  I have reviewed the above documentation for accuracy and completeness, and I agree with the above.   Vanna Scotland, MD

## 2020-06-14 ENCOUNTER — Other Ambulatory Visit: Payer: Self-pay | Admitting: Family Medicine

## 2020-06-14 ENCOUNTER — Other Ambulatory Visit (HOSPITAL_COMMUNITY): Payer: Self-pay | Admitting: Family Medicine

## 2020-06-14 DIAGNOSIS — R7401 Elevation of levels of liver transaminase levels: Secondary | ICD-10-CM

## 2020-06-18 ENCOUNTER — Other Ambulatory Visit: Payer: Medicare Other

## 2020-06-18 ENCOUNTER — Telehealth: Payer: Self-pay | Admitting: Radiology

## 2020-06-18 ENCOUNTER — Other Ambulatory Visit: Payer: Self-pay

## 2020-06-18 DIAGNOSIS — Z01818 Encounter for other preprocedural examination: Secondary | ICD-10-CM

## 2020-06-18 DIAGNOSIS — N390 Urinary tract infection, site not specified: Secondary | ICD-10-CM

## 2020-06-18 DIAGNOSIS — N35919 Unspecified urethral stricture, male, unspecified site: Secondary | ICD-10-CM

## 2020-06-18 LAB — URINALYSIS, COMPLETE
Bilirubin, UA: NEGATIVE
Glucose, UA: NEGATIVE
Ketones, UA: NEGATIVE
Nitrite, UA: NEGATIVE
Specific Gravity, UA: >1.03 — ABNORMAL HIGH (ref 1.005–1.030)
Urobilinogen, Ur: 0.2 mg/dL (ref 0.2–1.0)
pH, UA: 5.5 (ref 5.0–7.5)

## 2020-06-18 LAB — MICROSCOPIC EXAMINATION: WBC, UA: >30 /hpf — AB (ref 0–5)

## 2020-06-18 LAB — CULTURE, URINE COMPREHENSIVE

## 2020-06-18 MED ORDER — NITROFURANTOIN MONOHYD MACRO 100 MG PO CAPS: 100.0000 mg | ORAL_CAPSULE | Freq: Two times a day (BID) | ORAL | 0 refills | Status: DC

## 2020-06-18 NOTE — Telephone Encounter (Signed)
-----   Message from Vanna Scotland, MD sent at 06/18/2020  3:07 PM EST ----- Please let this patient know that we are going to need to switch his antibiotics to Macrobid 100 mg bid.  If he wants to go and start taking this now give him a total of 10 days.  Vanna Scotland, MD

## 2020-06-18 NOTE — Telephone Encounter (Signed)
Notified patient of script sent to pharmacy and to stop taking cipro.

## 2020-06-21 ENCOUNTER — Other Ambulatory Visit: Payer: Self-pay

## 2020-06-21 ENCOUNTER — Ambulatory Visit
Admission: RE | Admit: 2020-06-21 | Discharge: 2020-06-21 | Disposition: A | Discharge status: Home / Self Care | Payer: Medicare Other | Source: Ambulatory Visit | Attending: Family Medicine | Admitting: Family Medicine

## 2020-06-21 ENCOUNTER — Ambulatory Visit: Payer: Medicare Other

## 2020-06-21 ENCOUNTER — Other Ambulatory Visit
Admission: RE | Admit: 2020-06-21 | Discharge: 2020-06-21 | Disposition: A | Discharge status: Home / Self Care | Payer: Medicare Other | Source: Ambulatory Visit | Attending: Urology | Admitting: Urology

## 2020-06-21 DIAGNOSIS — Z01812 Encounter for preprocedural laboratory examination: Secondary | ICD-10-CM | POA: Insufficient documentation

## 2020-06-21 DIAGNOSIS — Z20822 Contact with and (suspected) exposure to covid-19: Secondary | ICD-10-CM | POA: Insufficient documentation

## 2020-06-21 DIAGNOSIS — R7401 Elevation of levels of liver transaminase levels: Secondary | ICD-10-CM | POA: Diagnosis not present

## 2020-06-21 LAB — CULTURE, URINE COMPREHENSIVE

## 2020-06-21 LAB — SARS CORONAVIRUS 2 (TAT 6-24 HRS): SARS Coronavirus 2: NEGATIVE

## 2020-06-25 ENCOUNTER — Ambulatory Visit
Admission: RE | Admit: 2020-06-25 | Discharge: 2020-06-25 | Disposition: A | Discharge status: Home / Self Care | Payer: Medicare Other | Attending: Urology | Admitting: Urology

## 2020-06-25 ENCOUNTER — Other Ambulatory Visit: Payer: Self-pay

## 2020-06-25 ENCOUNTER — Ambulatory Visit: Payer: Medicare Other | Admitting: Certified Registered"

## 2020-06-25 ENCOUNTER — Encounter: Payer: Self-pay | Admitting: Urology

## 2020-06-25 ENCOUNTER — Encounter
Admission: RE | Disposition: A | Discharge status: Home / Self Care | Payer: Self-pay | Source: Home / Self Care | Attending: Urology

## 2020-06-25 ENCOUNTER — Ambulatory Visit: Payer: Medicare Other

## 2020-06-25 DIAGNOSIS — J449 Chronic obstructive pulmonary disease, unspecified: Secondary | ICD-10-CM | POA: Insufficient documentation

## 2020-06-25 DIAGNOSIS — Z7951 Long term (current) use of inhaled steroids: Secondary | ICD-10-CM | POA: Diagnosis not present

## 2020-06-25 DIAGNOSIS — N35912 Unspecified bulbous urethral stricture, male: Secondary | ICD-10-CM | POA: Insufficient documentation

## 2020-06-25 DIAGNOSIS — Z79899 Other long term (current) drug therapy: Secondary | ICD-10-CM | POA: Insufficient documentation

## 2020-06-25 DIAGNOSIS — Z87891 Personal history of nicotine dependence: Secondary | ICD-10-CM | POA: Diagnosis not present

## 2020-06-25 DIAGNOSIS — I10 Essential (primary) hypertension: Secondary | ICD-10-CM | POA: Insufficient documentation

## 2020-06-25 DIAGNOSIS — K219 Gastro-esophageal reflux disease without esophagitis: Secondary | ICD-10-CM | POA: Diagnosis not present

## 2020-06-25 DIAGNOSIS — Z881 Allergy status to other antibiotic agents status: Secondary | ICD-10-CM | POA: Insufficient documentation

## 2020-06-25 DIAGNOSIS — N35812 Other urethral bulbous stricture, male: Secondary | ICD-10-CM | POA: Diagnosis present

## 2020-06-25 DIAGNOSIS — R319 Hematuria, unspecified: Secondary | ICD-10-CM | POA: Insufficient documentation

## 2020-06-25 DIAGNOSIS — I714 Abdominal aortic aneurysm, without rupture: Secondary | ICD-10-CM | POA: Diagnosis not present

## 2020-06-25 DIAGNOSIS — Z888 Allergy status to other drugs, medicaments and biological substances status: Secondary | ICD-10-CM | POA: Insufficient documentation

## 2020-06-25 DIAGNOSIS — Z8261 Family history of arthritis: Secondary | ICD-10-CM | POA: Diagnosis not present

## 2020-06-25 DIAGNOSIS — Z87440 Personal history of urinary (tract) infections: Secondary | ICD-10-CM | POA: Diagnosis not present

## 2020-06-25 DIAGNOSIS — N35919 Unspecified urethral stricture, male, unspecified site: Secondary | ICD-10-CM

## 2020-06-25 HISTORY — PX: CYSTOSCOPY WITH DIRECT VISION INTERNAL URETHROTOMY: SHX6637

## 2020-06-25 HISTORY — PX: CYSTOSCOPY WITH URETHRAL DILATATION: SHX5125

## 2020-06-25 HISTORY — PX: CYSTOSCOPY W/ RETROGRADES: SHX1426

## 2020-06-25 HISTORY — PX: CYSTOSCOPY WITH RETROGRADE URETHROGRAM: SHX6309

## 2020-06-25 SURGERY — CYSTOSCOPY, WITH DIRECT VISION INTERNAL URETHROTOMY
Anesthesia: General | Site: Urethra | Laterality: Right

## 2020-06-25 MED ORDER — DEXMEDETOMIDINE (PRECEDEX) IN NS 20 MCG/5ML (4 MCG/ML) IV SYRINGE
PREFILLED_SYRINGE | INTRAVENOUS | Status: DC | PRN
  Administered 2020-06-25: 4 ug via INTRAVENOUS

## 2020-06-25 MED ORDER — GLYCOPYRROLATE 0.2 MG/ML IJ SOLN
INTRAMUSCULAR | Status: DC | PRN
  Administered 2020-06-25: .2 mg via INTRAVENOUS

## 2020-06-25 MED ORDER — ACETAMINOPHEN 10 MG/ML IV SOLN
INTRAVENOUS | Status: DC | PRN
  Administered 2020-06-25: 1000 mg via INTRAVENOUS

## 2020-06-25 MED ORDER — ONDANSETRON HCL 4 MG/2ML IJ SOLN
INTRAMUSCULAR | Status: DC | PRN
  Administered 2020-06-25: 4 mg via INTRAVENOUS

## 2020-06-25 MED ORDER — LIDOCAINE HCL (CARDIAC) PF 100 MG/5ML IV SOSY
PREFILLED_SYRINGE | INTRAVENOUS | Status: DC | PRN
  Administered 2020-06-25: 80 mg via INTRAVENOUS

## 2020-06-25 MED ORDER — FENTANYL CITRATE (PF) 100 MCG/2ML IJ SOLN
INTRAMUSCULAR | Status: AC
  Filled 2020-06-25: qty 2

## 2020-06-25 MED ORDER — PHENYLEPHRINE HCL (PRESSORS) 10 MG/ML IV SOLN
INTRAVENOUS | Status: DC | PRN
  Administered 2020-06-25: 200 ug via INTRAVENOUS
  Administered 2020-06-25 (×4): 100 ug via INTRAVENOUS

## 2020-06-25 MED ORDER — MIDAZOLAM HCL 2 MG/2ML IJ SOLN
INTRAMUSCULAR | Status: AC
  Filled 2020-06-25: qty 2

## 2020-06-25 MED ORDER — MIDAZOLAM HCL 2 MG/2ML IJ SOLN
INTRAMUSCULAR | Status: DC | PRN
  Administered 2020-06-25: 2 mg via INTRAVENOUS

## 2020-06-25 MED ORDER — FENTANYL CITRATE (PF) 100 MCG/2ML IJ SOLN
INTRAMUSCULAR | Status: DC | PRN
  Administered 2020-06-25 (×4): 25 ug via INTRAVENOUS

## 2020-06-25 MED ORDER — ACETAMINOPHEN 10 MG/ML IV SOLN
INTRAVENOUS | Status: AC
  Filled 2020-06-25: qty 100

## 2020-06-25 MED ORDER — IOHEXOL 180 MG/ML  SOLN
INTRAMUSCULAR | Status: DC | PRN
  Administered 2020-06-25: 100 mL

## 2020-06-25 MED ORDER — CHLORHEXIDINE GLUCONATE 0.12 % MT SOLN
OROMUCOSAL | Status: AC
  Administered 2020-06-25: 15 mL via OROMUCOSAL
  Filled 2020-06-25: qty 15

## 2020-06-25 MED ORDER — PROPOFOL 10 MG/ML IV BOLUS
INTRAVENOUS | Status: DC | PRN
  Administered 2020-06-25: 120 mg via INTRAVENOUS

## 2020-06-25 MED ORDER — CEFAZOLIN SODIUM-DEXTROSE 2-4 GM/100ML-% IV SOLN
INTRAVENOUS | Status: AC
  Filled 2020-06-25: qty 100

## 2020-06-25 MED ORDER — CEFAZOLIN SODIUM-DEXTROSE 2-4 GM/100ML-% IV SOLN
2.0000 g | INTRAVENOUS | Status: AC
  Administered 2020-06-25: 2 g via INTRAVENOUS

## 2020-06-25 MED ORDER — DEXMEDETOMIDINE (PRECEDEX) IN NS 20 MCG/5ML (4 MCG/ML) IV SYRINGE
PREFILLED_SYRINGE | INTRAVENOUS | Status: AC
  Filled 2020-06-25: qty 5

## 2020-06-25 MED ORDER — EPHEDRINE SULFATE 50 MG/ML IJ SOLN
INTRAMUSCULAR | Status: DC | PRN
  Administered 2020-06-25: 10 mg via INTRAVENOUS

## 2020-06-25 MED ORDER — LACTATED RINGERS IV SOLN: INTRAVENOUS | Status: DC

## 2020-06-25 MED ORDER — ORAL CARE MOUTH RINSE: 15.0000 mL | Freq: Once | OROMUCOSAL | Status: AC

## 2020-06-25 MED ORDER — FENTANYL CITRATE (PF) 100 MCG/2ML IJ SOLN: 25.0000 ug | INTRAMUSCULAR | Status: DC | PRN

## 2020-06-25 MED ORDER — DEXAMETHASONE SODIUM PHOSPHATE 10 MG/ML IJ SOLN
INTRAMUSCULAR | Status: DC | PRN
  Administered 2020-06-25: 10 mg via INTRAVENOUS

## 2020-06-25 MED ORDER — CHLORHEXIDINE GLUCONATE 0.12 % MT SOLN: 15.0000 mL | Freq: Once | OROMUCOSAL | Status: AC

## 2020-06-25 MED ORDER — ONDANSETRON HCL 4 MG/2ML IJ SOLN: 4.0000 mg | Freq: Once | INTRAMUSCULAR | Status: DC | PRN

## 2020-06-25 MED ORDER — EPHEDRINE 5 MG/ML INJ
INTRAVENOUS | Status: AC
  Filled 2020-06-25: qty 10

## 2020-06-25 SURGICAL SUPPLY — 35 items
BAG DRAIN CYSTO-URO LG1000N (MISCELLANEOUS) ×4 IMPLANT
BAG DRN RND TRDRP ANRFLXCHMBR (UROLOGICAL SUPPLIES) ×2
BAG URINE DRAIN 2000ML AR STRL (UROLOGICAL SUPPLIES) ×4 IMPLANT
BAND RUBBER 3X1/6 STRL (MISCELLANEOUS) ×2 IMPLANT
BAND RUBBER 3X1/6 TAN STRL (MISCELLANEOUS) ×4 IMPLANT
BASIN GRAD PLASTIC 32OZ STRL (MISCELLANEOUS) ×4 IMPLANT
BRUSH SCRUB EZ 1% IODOPHOR (MISCELLANEOUS) ×4 IMPLANT
CATH BEACON 5 .035 65 KMP TIP (CATHETERS) ×2 IMPLANT
CATH FOL 2WAY LX 16X5 (CATHETERS) IMPLANT
CATH FOL 2WAY LX 18X30 (CATHETERS) ×2 IMPLANT
CATH FOLEY 2W COUNCIL 5CC 18FR (CATHETERS) ×2 IMPLANT
CATH URETHRAL DIL 7.0X29 (CATHETERS) ×4 IMPLANT
CATH URETL 5X70 OPEN END (CATHETERS) ×2 IMPLANT
DRAPE 3/4 80X56 (DRAPES) ×4 IMPLANT
ELECT REM PT RETURN 9FT ADLT (ELECTROSURGICAL) ×4
ELECTRODE REM PT RTRN 9FT ADLT (ELECTROSURGICAL) ×2 IMPLANT
GLOVE BIO SURGEON STRL SZ 6.5 (GLOVE) ×3 IMPLANT
GLOVE BIO SURGEON STRL SZ7 (GLOVE) ×8 IMPLANT
GLOVE BIO SURGEONS STRL SZ 6.5 (GLOVE) ×1
GOWN STRL REUS W/ TWL LRG LVL3 (GOWN DISPOSABLE) ×4 IMPLANT
GOWN STRL REUS W/TWL LRG LVL3 (GOWN DISPOSABLE) ×8
GUIDEWIRE STR DUAL SENSOR (WIRE) ×6 IMPLANT
KIT TURNOVER CYSTO (KITS) ×4 IMPLANT
MANIFOLD NEPTUNE II (INSTRUMENTS) ×2 IMPLANT
PACK CYSTO AR (MISCELLANEOUS) ×4 IMPLANT
SET CYSTO W/LG BORE CLAMP LF (SET/KITS/TRAYS/PACK) ×4 IMPLANT
SOL PREP PVP 2OZ (MISCELLANEOUS) ×4
SOLUTION PREP PVP 2OZ (MISCELLANEOUS) ×2 IMPLANT
SURGILUBE 2OZ TUBE FLIPTOP (MISCELLANEOUS) ×4 IMPLANT
SYR 10ML LL (SYRINGE) ×4 IMPLANT
SYR 30ML LL (SYRINGE) ×4 IMPLANT
SYR TOOMEY IRRIG 70ML (MISCELLANEOUS) ×4
SYRINGE TOOMEY IRRIG 70ML (MISCELLANEOUS) ×2 IMPLANT
WATER STERILE IRR 1000ML POUR (IV SOLUTION) ×4 IMPLANT
WATER STERILE IRR 3000ML UROMA (IV SOLUTION) ×4 IMPLANT

## 2020-06-25 NOTE — Progress Notes (Signed)
Dentures returned.

## 2020-06-25 NOTE — Discharge Instructions (Signed)
AMBULATORY SURGERY  DISCHARGE INSTRUCTIONS   1) The drugs that you were given will stay in your system until tomorrow so for the next 24 hours you should not:  A) Drive an automobile B) Make any legal decisions C) Drink any alcoholic beverage   2) You may resume regular meals tomorrow.  Today it is better to start with liquids and gradually work up to solid foods.  You may eat anything you prefer, but it is better to start with liquids, then soup and crackers, and gradually work up to solid foods.   3) Please notify your doctor immediately if you have any unusual bleeding, trouble breathing, redness and pain at the surgery site, drainage, fever, or pain not relieved by medication.    4) Additional Instructions:        Please contact your physician with any problems or Same Day Surgery at [phone], Monday through Friday 6 am to 4 pm, or [place] at [facility] number at [phone].  Urethrotomy, Care After This sheet gives you information about how to care for yourself after your procedure. Your health care provider may also give you more specific instructions. If you have problems or questions, contact your health care provider. What can I expect after the procedure? After the procedure, it is common to have:  Burning pain when urinating.  Pain or discomfort in your genital area.  A small amount of blood in your urine. Your health care provider will tell you how long you can expect to have blood in your urine.  Bloody urine leaking from around your catheter. Follow these instructions at home: Catheter and drainage bag   Follow instructions from your health care provider about how to care for your catheter and your drainage bag.  Do not take baths, swim, or use a hot tub until your catheter has been removed. You may take showers while your catheter is in place.  If you have to insert a catheter on your own (self-catheterization) after your catheter is  removed, make sure you understand the procedure completely. Carefully follow instructions from your health care provider. Medicines  Take over-the-counter and prescription medicines only as told by your health care provider.  If you were prescribed an antibiotic medicine, take it as told by your health care provider. Do not stop taking the antibiotic even if you start to feel better.  Ask your health care provider if the medicine prescribed to you: ? Requires you to avoid driving or using heavy machinery. ? Can cause constipation. You may need to take these actions to prevent or treat constipation:  Take over-the-counter or prescription medicines.  Eat foods that are high in fiber, such as beans, whole grains, and fresh fruits and vegetables.  Limit foods that are high in fat and processed sugars, such as fried or sweet foods. Activity   Do not drive for 24 hours if you were given a sedative during your procedure.  Take short walks several times a day during your recovery.  Do not lift anything that is heavier than 10 lb (4.5 kg), or the limit that you are told, until your health care provider says that it is safe.  Return to your normal activities as told by your health care provider. Ask your health care provider what activities are safe for you.  Do not have sex until your health care provider says it is okay. General instructions  Drink enough fluid to keep your urine pale yellow.  If a bandage (dressing)  was applied over the opening of your urethra, change the dressing as told by your health care provider. Make sure you: ? Wash your hands with soap and water before and after you change your dressing. If soap and water are not available, use hand sanitizer. ? Keep your dressing clean and dry.  Do not use any products that contain nicotine or tobacco, such as cigarettes, e-cigarettes, and chewing tobacco. These can delay healing after surgery. If you need help quitting, ask  your health care provider.  Keep all follow-up visits as told by your health care provider. This is important. Contact a health care provider if you:  Have a fever or chills.  Have pain that gets worse or does not get better with medicine.  Have blood in your urine for longer than your health care provider told you to expect.  Are a male and have any of these problems: ? Trouble getting an erection. ? Pain when you have an erection. ? Blood in your semen.  Have any of these problems after your catheter is removed: ? Trouble urinating. ? A slow urine stream. ? Urinating less than usual. ? Several streams or "spray" when you urinate.  Have pain in your abdomen.  Have swelling in your genital area that does not go away. Get help right away if:  You have severe pain.  A lot of blood is leaking from around your catheter.  You have blood clots in your urine.  Your catheter stops draining urine.  You cannot urinate after your catheter is removed.  You have redness, warmth, or pain in your leg.  You have chest pain.  You have trouble breathing. These symptoms may represent a serious problem that is an emergency. Do not wait to see if the symptoms will go away. Get medical help right away. Call your local emergency services (911 in the [place] Do not drive yourself to the hospital. Summary  After the procedure, it is common to have burning pain when urinating and a small amount of blood in your urine.  Follow instructions from your health care provider about how to care for your catheter and your drainage bag.  Take short walks several times a day during your recovery.  If a bandage (dressing) was applied over the opening of your urethra, change the dressing as told by your health care provider.  Contact your health care provider if you have trouble urinating after your catheter is removed. This information is not intended to replace advice given to you by your health care  provider. Make sure you discuss any questions you have with your health care provider. Document Revised: 01/31/2019 Document Reviewed: 01/31/2019 Elsevier Patient Education  2020 [employer]   Indwelling Urinary Catheter Care, Adult An indwelling urinary catheter is a thin tube that is put into your bladder. The tube helps to drain pee (urine) out of your body. The tube goes in through your urethra. Your urethra is where pee comes out of your body. Your pee will come out through the catheter, then it will go into a bag (drainage bag). Take good care of your catheter so it will work well. How to wear your catheter and bag Supplies needed  Sticky tape (adhesive tape) or a leg strap.  Alcohol wipe or soap and water (if you use tape).  A clean towel (if you use tape).  Large overnight bag.  Smaller bag (leg bag). Wearing your catheter Attach your catheter to your leg with tape or  a leg strap.  Make sure the catheter is not pulled tight.  If a leg strap gets wet, take it off and put on a dry strap.  If you use tape to hold the bag on your leg: 1. Use an alcohol wipe or soap and water to wash your skin where the tape made it sticky before. 2. Use a clean towel to pat-dry that skin. 3. Use new tape to make the bag stay on your leg. Wearing your bags You should have been given a large overnight bag.  You may wear the overnight bag in the day or night.  Always have the overnight bag lower than your bladder.  Do not let the bag touch the floor.  Before you go to sleep, put a clean plastic bag in a wastebasket. Then hang the overnight bag inside the wastebasket. You should also have a smaller leg bag that fits under your clothes.  Always wear the leg bag below your knee.  Do not wear your leg bag at night. How to care for your skin and catheter Supplies needed  A clean washcloth.  Water and mild soap.  A clean towel. Caring for your skin and catheter      Clean the  skin around your catheter every day: 1. Wash your hands with soap and water. 2. Wet a clean washcloth in warm water and mild soap. 3. Clean the skin around your urethra.  If you are male:  Gently spread the folds of skin around your vagina (labia).  With the washcloth in your other hand, wipe the inner side of your labia on each side. Wipe from front to back.  If you are male:  Pull back any skin that covers the end of your penis (foreskin).  With the washcloth in your other hand, wipe your penis in small circles. Start wiping at the tip of your penis, then move away from the catheter.  Move the foreskin back in place, if needed. 4. With your free hand, hold the catheter close to where it goes into your body.  Keep holding the catheter during cleaning so it does not get pulled out. 5. With the washcloth in your other hand, clean the catheter.  Only wipe downward on the catheter.  Do not wipe upward toward your body. Doing this may push germs into your urethra and cause infection. 6. Use a clean towel to pat-dry the catheter and the skin around it. Make sure to wipe off all soap. 7. Wash your hands with soap and water.  Shower every day. Do not take baths.  Do not use cream, ointment, or lotion on the area where the catheter goes into your body, unless your doctor tells you to.  Do not use powders, sprays, or lotions on your genital area.  Check your skin around the catheter every day for signs of infection. Check for: ? Redness, swelling, or pain. ? Fluid or blood. ? Warmth. ? Pus or a bad smell. How to empty the bag Supplies needed  Rubbing alcohol.  Gauze pad or cotton ball.  Tape or a leg strap. Emptying the bag Pour the pee out of your bag when it is ?- full, or at least 2-3 times a day. Do this for your overnight bag and your leg bag. 1. Wash your hands with soap and water. 2. Separate (detach) the bag from your leg. 3. Hold the bag over the toilet or a  clean pail. Keep the bag lower than your  hips and bladder. This is so the pee (urine) does not go back into the tube. 4. Open the pour spout. It is at the bottom of the bag. 5. Empty the pee into the toilet or pail. Do not let the pour spout touch any surface. 6. Put rubbing alcohol on a gauze pad or cotton ball. 7. Use the gauze pad or cotton ball to clean the pour spout. 8. Close the pour spout. 9. Attach the bag to your leg with tape or a leg strap. 10. Wash your hands with soap and water. Follow instructions for cleaning the drainage bag:  From the product maker.  As told by your doctor. How to change the bag Supplies needed  Alcohol wipes.  A clean bag.  Tape or a leg strap. Changing the bag Replace your bag when it starts to leak, smell bad, or look dirty. 1. Wash your hands with soap and water. 2. Separate the dirty bag from your leg. 3. Pinch the catheter with your fingers so that pee does not spill out. 4. Separate the catheter tube from the bag tube where these tubes connect (at the connection valve). Do not let the tubes touch any surface. 5. Clean the end of the catheter tube with an alcohol wipe. Use a different alcohol wipe to clean the end of the bag tube. 6. Connect the catheter tube to the tube of the clean bag. 7. Attach the clean bag to your leg with tape or a leg strap. Do not make the bag tight on your leg. 8. Wash your hands with soap and water. General rules   Never pull on your catheter. Never try to take it out. Doing that can hurt you.  Always wash your hands before and after you touch your catheter or bag. Use a mild, fragrance-free soap. If you do not have soap and water, use hand sanitizer.  Always make sure there are no twists or bends (kinks) in the catheter tube.  Always make sure there are no leaks in the catheter or bag.  Drink enough fluid to keep your pee pale yellow.  Do not take baths, swim, or use a hot tub.  If you are male, wipe  from front to back after you poop (have a bowel movement). Contact a doctor if:  Your pee is cloudy.  Your pee smells worse than usual.  Your catheter gets clogged.  Your catheter leaks.  Your bladder feels full. Get help right away if:  You have redness, swelling, or pain where the catheter goes into your body.  You have fluid, blood, pus, or a bad smell coming from the area where the catheter goes into your body.  Your skin feels warm where the catheter goes into your body.  You have a fever.  You have pain in your: ? Belly (abdomen). ? Legs. ? Lower back. ? Bladder.  You see blood in the catheter.  Your pee is pink or red.  You feel sick to your stomach (nauseous).  You throw up (vomit).  You have chills.  Your pee is not draining into the bag.  Your catheter gets pulled out. Summary  An indwelling urinary catheter is a thin tube that is placed into the bladder to help drain pee (urine) out of the body.  The catheter is placed into the part of the body that drains pee from the bladder (urethra).  Taking good care of your catheter will keep it working properly and help prevent problems.  Always wash your hands before and after touching your catheter or bag.  Never pull on your catheter or try to take it out. This information is not intended to replace advice given to you by your health care provider. Make sure you discuss any questions you have with your health care provider. Document Revised: 11/19/2018 Document Reviewed: 03/13/2017 Elsevier Patient Education  2020 [employer].

## 2020-06-25 NOTE — Anesthesia Preprocedure Evaluation (Signed)
Anesthesia Evaluation  Patient identified by MRN, date of birth, ID band Patient awake    Reviewed: Allergy & Precautions, H&P , NPO status , Patient's Chart, lab work & pertinent test results, reviewed documented beta blocker date and time   Airway Mallampati: II  TM Distance: >3 FB Neck ROM: full    Dental  (+) Teeth Intact   Pulmonary neg pulmonary ROS, asthma , COPD,  COPD inhaler, former smoker,    Pulmonary exam normal        Cardiovascular Exercise Tolerance: Poor hypertension, On Medications negative cardio ROS Normal cardiovascular exam Rate:Normal     Neuro/Psych negative neurological ROS  negative psych ROS   GI/Hepatic Neg liver ROS, GERD  Medicated,  Endo/Other  negative endocrine ROS  Renal/GU negative Renal ROS  negative genitourinary   Musculoskeletal   Abdominal   Peds  Hematology negative hematology ROS (+)   Anesthesia Other Findings   Reproductive/Obstetrics negative OB ROS                             Anesthesia Physical Anesthesia Plan  ASA: III  Anesthesia Plan: General LMA   Post-op Pain Management:    Induction:   PONV Risk Score and Plan:   Airway Management Planned:   Additional Equipment:   Intra-op Plan:   Post-operative Plan:   Informed Consent: I have reviewed the patients History and Physical, chart, labs and discussed the procedure including the risks, benefits and alternatives for the proposed anesthesia with the patient or authorized representative who has indicated his/her understanding and acceptance.       Plan Discussed with: CRNA  Anesthesia Plan Comments:         Anesthesia Quick Evaluation

## 2020-06-25 NOTE — Transfer of Care (Signed)
Immediate Anesthesia Transfer of Care Note  Patient: Keith Craig.  Procedure(s) Performed: CYSTOSCOPY WITH DIRECT VISION INTERNAL URETHROTOMY (N/A Urethra) CYSTOSCOPY WITH URETHRAL DILATATION (N/A Urethra) CYSTOSCOPY WITH RETROGRADE URETHROGRAM (N/A Urethra) CYSTOSCOPY WITH RETROGRADE PYELOGRAM (Right Ureter)  Patient Location: PACU  Anesthesia Type:General  Level of Consciousness: awake, drowsy and patient cooperative  Airway & Oxygen Therapy: Patient Spontanous Breathing and Patient connected to face mask oxygen  Post-op Assessment: Report given to RN and Post -op Vital signs reviewed and stable  Post vital signs: Reviewed and stable  Last Vitals:  Vitals Value Taken Time  BP 160/91 06/25/20 1341  Temp    Pulse 79 06/25/20 1344  Resp 10 06/25/20 1344  SpO2 97 % 06/25/20 1344  Vitals shown include unvalidated device data.  Last Pain:  Vitals:   06/25/20 1200  TempSrc: Tympanic  PainSc: 2       Patients Stated Pain Goal: 0 (06/25/20 1200)  Complications: No complications documented.

## 2020-06-25 NOTE — Anesthesia Procedure Notes (Signed)
Procedure Name: LMA Insertion Performed by: Mohammed Kindle, CRNA Pre-anesthesia Checklist: Patient identified, Emergency Drugs available, Suction available and Patient being monitored Patient Re-evaluated:Patient Re-evaluated prior to induction Oxygen Delivery Method: Circle system utilized Preoxygenation: Pre-oxygenation with 100% oxygen Induction Type: IV induction Ventilation: Mask ventilation without difficulty LMA: LMA inserted LMA Size: 4.0 Placement Confirmation: positive ETCO2,  CO2 detector and breath sounds checked- equal and bilateral Dental Injury: Teeth and Oropharynx as per pre-operative assessment

## 2020-06-25 NOTE — Op Note (Signed)
Date of procedure: 06/25/20  Preoperative diagnosis:  1. Bulbar urethral stricture 2. Recurrent UTIs  3. Hematuria  Postoperative diagnosis:  1. Same as above   Procedure: 1. Retrograde urethrogram (RUG) 2. Cystoscopy 3. DVIU 4. Balloon dilation of bulbar urethra 5. Cystogram 6. Right retrograde pyelogram 7. Foley catheter placement over the wire  Surgeon: Vanna Scotland, MD  Anesthesia: General  Complications: None  Intraoperative findings: Severe bulbar urethral stricture.  Sseveral centimeters of stricture at the bulbar urethra with very narrow caliber on retrograde urethrogram.  Attempted DVIU but due to density of the stricture, was unable to get the wire successfully into the bladder beyond stricture, small false pass from the wire.  Ultimately able to take maneuver wire into the bladder, elected to balloon dilate the stricture.  Debris-filled trabeculated bladder with left lateral wall widemouth diverticulum.  Right retrograde pyelogram of distal ureter unremarkable.  EBL: Minimal  Specimens: None  Drains: 18 French converted council tip catheter  Indication: Doctor Sheahan. is a 74 y.o. patient with recurrent UTIs, hematuria and history of urethral stricture found to have dense bulbar urethral stricture unable to accomplish cystoscopy in the office.  He did undergo CT urogram which was unremarkable although the distal right ureter was not completely opacified..  After reviewing the management options for treatment, he elected to proceed with the above surgical procedure(s). We have discussed the potential benefits and risks of the procedure, side effects of the proposed treatment, the likelihood of the patient achieving the goals of the procedure, and any potential problems that might occur during the procedure or recuperation. Informed consent has been obtained.  Description of procedure:  The patient was taken to the operating room and general anesthesia was  induced.  The patient was placed in the lateral decubitus position, prepped and draped in the usual sterile fashion, and preoperative antibiotics were administered. A preoperative time-out was performed.   Sterile rubber bands were used just behind the glans in order to stretch the pendulous urethra.  Contrast was injected using a Christmas tree adapter and a Toomey syringe through the urethra.  Notably, the pendulous urethra was unremarkable but there was a fairly significant at least 2 cm long stricture in the bulbar urethral area which is relatively tight.  Bladder was noted to have an irregular contour consistent with trabeculation and diverticulum.  Please see PACS for this retrograde.  Next, he was repositioned in the dorsolithotomy position and reprepped and draped.  Advanced a 21 French cystoscope down to the level of the bulbar urethra where the stricture was encountered.  It was at least 6 [place] in diameter, possibly smaller.  I attempted to advance a wire unsuccessfully.  I then brought in a DVIU scope and using the cold blade, an incision was created at the 12 o'clock position in order to spring open the stricture.  I encountered another area just proximal to this which was also relatively dense.  I attempted to make an incision in this area there is a small flap of mucosa and visualization became somewhat poor.  Again, I attempted to advance a wire through the scope unsuccessfully because able to advance it through was felt to be the true lumen at this continue to stop this at the bladder neck.  Advance a 5 [place] open-ended ureteral catheter over this wire withdrew the wire and injected contrast.  This appeared to be within a small false pass.  This ultimately drained.  I then used a Kumpe catheter with  angulation in order to help maneuver the wire into the adequate position just to the left of this false pass and ultimately the wire did traverse into the bladder.  I advanced the open-ended over  the wire and injected Conray to perform a cystogram to ensure that the wire was in fact within the bladder which was confirmed.  The wires and replaced this down to place as a safety wire.  At this point time, I elected to use a balloon to dilate this dense bulbar urethral stricture rather than the DVIU as this was previously not particularly successful.  24 French Cook 15 cm balloon was advanced to the level of the prostatic urethra across the bulbar urethra and inflated with 10 cc of diluted contrast solution.  This allowed to dwell for at least 3 to 4 minutes in order to maximally dilate the bulbar urethral stricture.  The wire was left in place as a safety wire after the balloon was removed.  I was then able to advance a 21 French cystoscope alongside his wire into the bladder.  Notably, the bulbar urethra had a dense, shaggy-like appearance consistent with severe and chronic stricture disease.  The prostatic urethra had bilobar coaptation but was fairly unremarkable.  The bladder was then inspected in detail.  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  There were no papillary tumors lesions or stones within the bladder itself.  In reviewing the CT urogram prior to this procedure, entire wrist remainder of the collecting system was adequately surveyed except for the right distal ureter which was incompletely opacified.  As such, elected to complete a right retrograde pyelogram.  A 5 French open-ended ureteral catheter was advanced just within the UO and a gentle retrograde pyelogram was performed on the side.  This was unremarkable with adequate distention of this ureter without filling defects or hydroureteronephrosis.  Finally, the scope was removed.  An 15 [place] council tip catheter was inserted over the wire as a guide  into the bladder.  Balloon was filled with 30 cc of sterile water.  The wire was removed and the catheter drained adequately.  The patient was then cleaned and dried, repositioned supine position, reversed myesthesia, taken to the PACU in stable condition.  Plan: Given the severity of the bulbar urethral stricture, leave the catheter for at least a week.  He may benefit from learning how to CIC in order to keep his bulbar urethra patent.  He is a high risk of recurrence of the stricture.  He will complete his antibiotic course.  I will see him about 6 weeks to reassess his symptoms.  Vanna Scotland, M.D.

## 2020-06-25 NOTE — Interval H&P Note (Signed)
History and Physical Interval Note:  06/25/2020 12:22 PM  Keith Craig.  has presented today for surgery, with the diagnosis of urethral stricture.  The various methods of treatment have been discussed with the patient and family. After consideration of risks, benefits and other options for treatment, the patient has consented to  Procedure(s): CYSTOSCOPY WITH DIRECT VISION INTERNAL URETHROTOMY (N/A) CYSTOSCOPY WITH URETHRAL DILATATION (N/A) CYSTOSCOPY WITH RETROGRADE URETHROGRAM (N/A) as a surgical intervention.  The patient's history has been reviewed, patient examined, no change in status, stable for surgery.  I have reviewed the patient's chart and labs.  Questions were answered to the patient's satisfaction.     Vanna Scotland  RRR CTAB  UCx +, currently on bactrim

## 2020-06-26 ENCOUNTER — Encounter: Payer: Self-pay | Admitting: Urology

## 2020-06-26 NOTE — Anesthesia Postprocedure Evaluation (Signed)
Anesthesia Post Note  Patient: Keith Craig.  Procedure(s) Performed: CYSTOSCOPY WITH DIRECT VISION INTERNAL URETHROTOMY (N/A Urethra) CYSTOSCOPY WITH URETHRAL DILATATION (N/A Urethra) CYSTOSCOPY WITH RETROGRADE URETHROGRAM (N/A Urethra) CYSTOSCOPY WITH RETROGRADE PYELOGRAM (Right Ureter)  Patient location during evaluation: PACU Anesthesia Type: General Level of consciousness: awake and alert Pain management: pain level controlled Vital Signs Assessment: post-procedure vital signs reviewed and stable Respiratory status: spontaneous breathing, nonlabored ventilation, respiratory function stable and patient connected to nasal cannula oxygen Cardiovascular status: blood pressure returned to baseline and stable Postop Assessment: no apparent nausea or vomiting Anesthetic complications: no   No complications documented.   Last Vitals:  Vitals:   06/25/20 1412 06/25/20 1415  BP: (!) 136/94   Pulse: 60 60  Resp: 12 (!) 8  Temp:  36.7 C  SpO2: 96% 96%    Last Pain:  Vitals:   06/25/20 1415  TempSrc:   PainSc: 0-No pain                 Yevette Edwards

## 2020-07-03 ENCOUNTER — Ambulatory Visit (INDEPENDENT_AMBULATORY_CARE_PROVIDER_SITE_OTHER): Payer: Medicare Other | Admitting: Physician Assistant

## 2020-07-03 ENCOUNTER — Ambulatory Visit: Payer: Medicare Other | Admitting: Physician Assistant

## 2020-07-03 ENCOUNTER — Other Ambulatory Visit: Payer: Self-pay

## 2020-07-03 ENCOUNTER — Encounter: Payer: Self-pay | Admitting: Physician Assistant

## 2020-07-03 VITALS — BP 169/82 | HR 89 | Ht 72.0 in | Wt 210.0 lb

## 2020-07-03 DIAGNOSIS — N472 Paraphimosis: Secondary | ICD-10-CM

## 2020-07-03 DIAGNOSIS — N35919 Unspecified urethral stricture, male, unspecified site: Secondary | ICD-10-CM

## 2020-07-03 NOTE — Progress Notes (Signed)
Catheter Removal  Patient is present today for a catheter removal.  64ml of water was drained from the balloon. A 18FR foley cath was removed from the bladder no complications were noted . Patient tolerated well.  Performed by: Carman Ching, PA-C   Additional notes: After patient CIC teaching today, he requested to defer this approximately 1 week.  Counseled patient to follow-up in clinic if he has any difficulty voiding.  Patient was noted to have paraphimosis on physical exam today.  He states he prefers to keep his foreskin retracted for hygiene purposes, however it is not typically edematous.  He states it did become edematous over the past 2 to 3 days.  Glans penis is pink with normal capillary refill today.  I reduce the foreskin in clinic today without difficulty and counseled the patient to keep the foreskin reduced to allow for resolution of edema.  He expressed understanding.  Follow up: Return in about 1 week (around 07/10/2020) for CIC teaching.

## 2020-07-11 ENCOUNTER — Other Ambulatory Visit: Payer: Self-pay

## 2020-07-11 ENCOUNTER — Ambulatory Visit (INDEPENDENT_AMBULATORY_CARE_PROVIDER_SITE_OTHER): Payer: Medicare Other | Admitting: Physician Assistant

## 2020-07-11 ENCOUNTER — Encounter: Payer: Self-pay | Admitting: Physician Assistant

## 2020-07-11 VITALS — BP 143/90 | HR 70 | Ht 72.0 in

## 2020-07-11 DIAGNOSIS — N35919 Unspecified urethral stricture, male, unspecified site: Secondary | ICD-10-CM

## 2020-07-11 NOTE — Progress Notes (Signed)
Continuous Intermittent Catheterization  Due to bulbar urethral stricture patient is present today for a teaching of self I & O Catheterization. Patient was given detailed verbal and printed instructions of self catheterization. Patient was cleaned and prepped in a clean fashion.  With instruction and assistance patient inserted a [employer] SpeediCath Standard 14FR coude and urine return was noted 20 ml, urine was yellow in color. Patient tolerated well, no complications were noted Patient was given a sample bag with supplies to take home.  Instructions were given for patient to cath 3 times weekly for the next month until his next scheduled postop appointment with Dr. Apolinar Junes.  An order was placed with [employer] for catheters to be sent to the patient's home.   Performed by: Carman Ching, PA-C

## 2020-07-11 NOTE — Patient Instructions (Addendum)
Self-cath three times weekly until your next scheduled appointment with Dr. Apolinar Junes.     Step 1 Get all of your supplies ready and place near you. Step 2 Wash your hands, or put on gloves. Step 3 Wash around the tip of your penis with warm antibacterial soapy water. Step 4 Take catheter out of package and drain the lubricant over toilet. Step 5 While holding the penis at a 45 degree angle from the stomach in one hand and the catheter in the other hand  Step 6 Insert the catheter slowly into your urethra. If there is resistance when the catheter reaches the sphincter muscle,              take a deep breath and gently apply steady pressure.              DO NOT FORCE THE CATHETER Step 7 When the urine begins to flow insert another inch and lower penis. Allow the urine to flow into the toilet. Step 8 When the flow of urine stops, slowly remove the catheter.

## 2020-07-12 ENCOUNTER — Telehealth: Payer: Self-pay

## 2020-07-12 NOTE — Telephone Encounter (Signed)
Patient called stating after CIC teaching yesterday he was voiding well. This morning his urine was clear  until this morning @ 9:30 when he passed a steady stream of bright red blood and a blood clot/ possible scab. Patient states urine is mostly clear now. It was explained that is is most likely due to a clot or scab from surgery which is normal. He may see more blood in urine with CIC,  as long as he is not passing thick clotted blood. Patient verbalized understanding and will keep follow up as scheduled.

## 2020-08-15 ENCOUNTER — Other Ambulatory Visit: Payer: Self-pay

## 2020-08-15 ENCOUNTER — Encounter: Payer: Self-pay | Admitting: Urology

## 2020-08-15 ENCOUNTER — Ambulatory Visit (INDEPENDENT_AMBULATORY_CARE_PROVIDER_SITE_OTHER): Payer: Medicare Other | Admitting: Urology

## 2020-08-15 VITALS — BP 134/76 | HR 63

## 2020-08-15 DIAGNOSIS — N138 Other obstructive and reflux uropathy: Secondary | ICD-10-CM | POA: Diagnosis not present

## 2020-08-15 DIAGNOSIS — N35919 Unspecified urethral stricture, male, unspecified site: Secondary | ICD-10-CM

## 2020-08-15 DIAGNOSIS — N401 Enlarged prostate with lower urinary tract symptoms: Secondary | ICD-10-CM | POA: Diagnosis not present

## 2020-08-15 LAB — BLADDER SCAN AMB NON-IMAGING: Scan Result: 12

## 2020-08-15 NOTE — Progress Notes (Signed)
08/15/2020 3:39 PM   [name] 1946/05/08 [id number]  Referring provider: Marguarite Arbour, MD [address] [facility] [place],  [place] [address]  Chief Complaint  Patient presents with  . Routine Post Op    HPI: 75 year old male with severe bulbar urethral strictures status post evaluation/balloon dilation in the operating room on 06/26/2019 when he returns today for routine follow-up.  In the interim, his Foley catheter remained in place for about a week.  He was subsequently removed.  He underwent CIC teaching has been doing this initially several times a day since to keep his urethra patent.  Overall, he is doing extremely well.  He reports that he now has a good stream, feels like he is emptying well and is quite pleased.  CIC is been going well and he is now cut back to just 1-2 times per week.  He did have 1 traumatic episode otherwise as long as he is in a standing position cathing, has no difficulty.    Results for orders placed or performed in visit on 08/15/20  Bladder Scan (Post Void Residual) in office  Result Value Ref Range   Scan Result 12       IPSS    Row Name 08/15/20 1400         International Prostate Symptom Score   How often have you had the sensation of not emptying your bladder? Less than 1 in 5     How often have you had to urinate less than every two hours? Less than half the time     How often have you found you stopped and started again several times when you urinated? Not at All     How often have you found it difficult to postpone urination? Less than 1 in 5 times     How often have you had a weak urinary stream? Not at All     How often have you had to strain to start urination? Less than half the time     How many times did you typically get up at night to urinate? 2 Times     Total IPSS Score 8           Quality of Life due to urinary symptoms   If you were to spend the rest of your life with your urinary  condition just the way it is now how would you feel about that? Mostly Satisfied            Score:  1-7 Mild 8-19 Moderate 20-35 Severe    PMH: Past Medical History:  Diagnosis Date  . Arthritis   . Benign essential microscopic hematuria   . BPH with obstruction/lower urinary tract symptoms   . Chills   . Chronic airway obstruction (HCC)   . Chronic obstructive asthma (HCC)   . Esophageal reflux   . HTN (hypertension)   . Over weight     Surgical History: Past Surgical History:  Procedure Laterality Date  . cataract surgery Bilateral   . CHOLECYSTECTOMY    . CYSTOSCOPY W/ RETROGRADES Right 06/25/2020   Procedure: CYSTOSCOPY WITH RETROGRADE PYELOGRAM;  Surgeon: Vanna Scotland, MD;  Location: [facility];  Service: Urology;  Laterality: Right;  . CYSTOSCOPY WITH DIRECT VISION INTERNAL URETHROTOMY N/A 06/25/2020   Procedure: CYSTOSCOPY WITH DIRECT VISION INTERNAL URETHROTOMY;  Surgeon: Vanna Scotland, MD;  Location: [facility];  Service: Urology;  Laterality: N/A;  . CYSTOSCOPY WITH RETROGRADE URETHROGRAM N/A 06/25/2020  Procedure: CYSTOSCOPY WITH RETROGRADE URETHROGRAM;  Surgeon: Vanna Scotland, MD;  Location: [facility];  Service: Urology;  Laterality: N/A;  . CYSTOSCOPY WITH URETHRAL DILATATION N/A 06/25/2020   Procedure: CYSTOSCOPY WITH URETHRAL DILATATION;  Surgeon: Vanna Scotland, MD;  Location: [facility];  Service: Urology;  Laterality: N/A;  . HIATAL HERNIA REPAIR    . microvascular decompression of left trigeminal nerve    . RHIZOTOMY    . small bowel restrictions    . URETHRAL STRICTURE DILATATION      Home Medications:  Allergies as of 08/15/2020      Reactions   Bactrim [sulfamethoxazole-trimethoprim] Nausea And Vomiting   Vitamin B12 [cyanocobalamin] Itching, Rash   Injection, tolerates the tablets       Medication List       Accurate as of August 15, 2020 11:59 PM. If you have any questions, ask your nurse or doctor.        acetaminophen 500 MG  tablet Commonly known as: TYLENOL Take 1,000 mg by mouth every 6 (six) hours as needed for moderate pain or headache.   albuterol 108 (90 Base) MCG/ACT inhaler Commonly known as: VENTOLIN HFA Inhale 2 puffs into the lungs every 6 (six) hours as needed for wheezing or shortness of breath.   cloNIDine 0.2 MG tablet Commonly known as: CATAPRES Take 0.2 mg by mouth 2 (two) times daily.   finasteride 5 MG tablet Commonly known as: PROSCAR Take 1 tablet (5 mg total) by mouth daily.   fluticasone 50 MCG/ACT nasal spray Commonly known as: FLONASE Place 1 spray into both nostrils daily as needed for allergies or rhinitis.   Fluticasone-Salmeterol 500-50 MCG/DOSE Aepb Commonly known as: ADVAIR Inhale 1 puff into the lungs 2 (two) times daily.   montelukast 10 MG tablet Commonly known as: SINGULAIR Take 10 mg by mouth at bedtime.   pantoprazole 20 MG tablet Commonly known as: PROTONIX Take 20 mg by mouth 2 (two) times daily.   predniSONE 5 MG tablet Commonly known as: DELTASONE Take 5 mg by mouth daily with breakfast.   tamsulosin 0.4 MG Caps capsule Commonly known as: FLOMAX Take 1 capsule (0.4 mg total) by mouth daily.   valsartan 80 MG tablet Commonly known as: DIOVAN Take 80 mg by mouth 2 (two) times daily.       Allergies:  Allergies  Allergen Reactions  . Bactrim [Sulfamethoxazole-Trimethoprim] Nausea And Vomiting  . Vitamin B12 [Cyanocobalamin] Itching and Rash    Injection, tolerates the tablets     Family History: Family History  Problem Relation Age of Onset  . Arthritis/Rheumatoid Father   . Kidney disease Neg Hx   . Prostate cancer Neg Hx   . Kidney cancer Neg Hx   . Bladder Cancer Neg Hx     Social History:  reports that he quit smoking about 51 years ago. He has never used smokeless tobacco. He reports that he does not drink alcohol and does not use drugs.   Physical Exam: BP 134/76   Pulse 63   Constitutional:  Alert and oriented, No acute  distress. HEENT: [place] AT, moist mucus membranes.  Trachea midline, no masses. Cardiovascular: No clubbing, cyanosis, or edema. Respiratory: Normal respiratory effort, no increased work of breathing. Neurologic: Grossly intact, no focal deficits, moving all 4 extremities. Psychiatric: Normal mood and affect.   Assessment & Plan:    1. BPH with obstruction/lower urinary tract symptoms Give is an extensive history, will have him continue his BPH medications for the time being  Reassess at next follow-up  - Bladder Scan (Post Void Residual) in office  2. Stricture of male urethra, unspecified stricture type Severe bulbar urethral stricture now status post dilation  I like him to continue to self cathing given the severity of his stricture at least a couple times a week for the short-term to ensure that this remains patent.  He desires to avoid urethroplasty more definitive surgery thus manage via the former to ensure we will try to decrease the risk of recurrence.  Follow-up in 6 months for IPSS/PVR  Hollice Espy, MD  [place] [address], [place], [place] [address] [phone]

## 2020-09-17 ENCOUNTER — Other Ambulatory Visit: Payer: Self-pay | Admitting: Specialist

## 2020-09-17 DIAGNOSIS — R918 Other nonspecific abnormal finding of lung field: Secondary | ICD-10-CM

## 2020-09-27 ENCOUNTER — Ambulatory Visit: Admission: RE | Admit: 2020-09-27 | Payer: Medicare Other | Source: Ambulatory Visit

## 2020-10-10 ENCOUNTER — Other Ambulatory Visit: Payer: Self-pay

## 2020-10-10 ENCOUNTER — Ambulatory Visit
Admission: RE | Admit: 2020-10-10 | Discharge: 2020-10-10 | Disposition: A | Discharge status: Home / Self Care | Payer: Medicare Other | Source: Ambulatory Visit | Attending: Specialist | Admitting: Specialist

## 2020-10-10 DIAGNOSIS — R918 Other nonspecific abnormal finding of lung field: Secondary | ICD-10-CM | POA: Insufficient documentation

## 2021-02-11 NOTE — Progress Notes (Signed)
02/12/2021 1:39 PM   Keith Craig Apr 13, 1946 [id number]  Referring provider: Idelle Crouch, MD [place] [facility] [place],  [place] [address]  Urological history: 1. Urethral stricture -Balloon dilation of bulbar urethra 06/2020 -managed with self cathing twice weekly  2. BPH with LU TS -I PSS 5/2 -PVR 26 mL -managed with tamsulosin 0.4 mg and finasteride 5 mg daily  3. 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 No hydronephrosis. No suspicious renal cortical masses. No evidence of urothelial lesions, with limitations as described.  Mild diffuse bladder wall thickening and trabeculation with small left bladder wall diverticulum, suggesting chronic bladder outlet obstruction -cysto with strictures 2021 -no complaint of gross heme -UA 11-30 RBC's   Chief Complaint  Patient presents with   Benign Prostatic Hypertrophy    HPI: Keith Craigis a 75 y.o.  male who presents today for follow up.  He stopped self cathing in March secondary to gross heme (dark brown)  in hopes that the blood would also stop.  He continues to have episodes of gross heme.   The hematuria is terminal.  He notices this when he post pones urination.  He is not on blood thinners.  He does not take a lot of NSAID'S for his hip pain.    He is taking finasteride 5 mg daily along with his tamsulosin 0.4 mg daily.    UA > 30 WBC's, 11-30 RBC's and few bacteria.  PVR 26 mL.     PMH: Past Medical History:  Diagnosis Date   Arthritis    Benign essential microscopic hematuria    BPH with obstruction/lower urinary tract symptoms    Chills    Chronic airway obstruction (HCC)    Chronic obstructive asthma (HCC)    Esophageal reflux    HTN (hypertension)    Over weight     Surgical History: Past Surgical History:  Procedure Laterality Date   cataract surgery Bilateral    CHOLECYSTECTOMY     CYSTOSCOPY W/ RETROGRADES Right 06/25/2020    Procedure: CYSTOSCOPY WITH RETROGRADE PYELOGRAM;  Surgeon: Hollice Espy, MD;  Location: [facility];  Service: Urology;  Laterality: Right;   CYSTOSCOPY WITH DIRECT VISION INTERNAL URETHROTOMY N/A 06/25/2020   Procedure: CYSTOSCOPY WITH DIRECT VISION INTERNAL URETHROTOMY;  Surgeon: Hollice Espy, MD;  Location: [facility];  Service: Urology;  Laterality: N/A;   CYSTOSCOPY WITH RETROGRADE URETHROGRAM N/A 06/25/2020   Procedure: CYSTOSCOPY WITH RETROGRADE URETHROGRAM;  Surgeon: Hollice Espy, MD;  Location: [facility];  Service: Urology;  Laterality: N/A;   CYSTOSCOPY WITH URETHRAL DILATATION N/A 06/25/2020   Procedure: CYSTOSCOPY WITH URETHRAL DILATATION;  Surgeon: Hollice Espy, MD;  Location: [facility];  Service: Urology;  Laterality: N/A;   HIATAL HERNIA REPAIR     microvascular decompression of left trigeminal nerve     RHIZOTOMY     small bowel restrictions     URETHRAL STRICTURE DILATATION      Home Medications:  Allergies as of 02/12/2021       Reactions   Bactrim [sulfamethoxazole-trimethoprim] Nausea And Vomiting   Vitamin B12 [cyanocobalamin] Itching, Rash   Injection, tolerates the tablets         Medication List        Accurate as of February 12, 2021  1:39 PM. If you have any questions, ask your nurse or doctor.          acetaminophen 500 MG tablet Commonly known as: TYLENOL Take 1,000 mg  by mouth every 6 (six) hours as needed for moderate pain or headache.   albuterol 108 (90 Base) MCG/ACT inhaler Commonly known as: VENTOLIN HFA Inhale 2 puffs into the lungs every 6 (six) hours as needed for wheezing or shortness of breath.   cloNIDine 0.2 MG tablet Commonly known as: CATAPRES Take 0.2 mg by mouth 2 (two) times daily.   finasteride 5 MG tablet Commonly known as: PROSCAR Take 1 tablet (5 mg total) by mouth daily.   fluticasone 50 MCG/ACT nasal spray Commonly known as: FLONASE Place 1 spray into both nostrils daily as needed for allergies or rhinitis.    Fluticasone-Salmeterol 500-50 MCG/DOSE Aepb Commonly known as: ADVAIR Inhale 1 puff into the lungs 2 (two) times daily.   montelukast 10 MG tablet Commonly known as: SINGULAIR Take 10 mg by mouth at bedtime.   pantoprazole 20 MG tablet Commonly known as: PROTONIX Take 20 mg by mouth 2 (two) times daily.   predniSONE 5 MG tablet Commonly known as: DELTASONE Take 5 mg by mouth daily with breakfast.   tamsulosin 0.4 MG Caps capsule Commonly known as: FLOMAX Take 1 capsule (0.4 mg total) by mouth daily.   valsartan 80 MG tablet Commonly known as: DIOVAN Take 80 mg by mouth 2 (two) times daily.        Allergies:  Allergies  Allergen Reactions   Bactrim [Sulfamethoxazole-Trimethoprim] Nausea And Vomiting   Vitamin B12 [Cyanocobalamin] Itching and Rash    Injection, tolerates the tablets     Family History: Family History  Problem Relation Age of Onset   Arthritis/Rheumatoid Father    Kidney disease Neg Hx    Prostate cancer Neg Hx    Kidney cancer Neg Hx    Bladder Cancer Neg Hx     Social History:  reports that he quit smoking about 51 years ago. He has never used smokeless tobacco. He reports that he does not drink alcohol and does not use drugs.  ROS: Pertinent ROS in HPI  Physical Exam: BP (!) 167/85   Pulse 81   Ht 6' (1.829 m)   Wt 216 lb (98 kg)   BMI 29.29 kg/m   Constitutional:  Well nourished. Alert and oriented, No acute distress. HEENT: [place] AT, mask in place.  Trachea midline Cardiovascular: No clubbing, cyanosis, or edema. Respiratory: Normal respiratory effort, no increased work of breathing. Neurologic: Grossly intact, no focal deficits, moving all 4 extremities. Psychiatric: Normal mood and affect.  Laboratory Data: WBC (White Blood Cell Count) 4.1 - 10.2 10^3/uL 6.4   RBC (Red Blood Cell Count) 4.69 - 6.13 10^6/uL 5.01   Hemoglobin 14.1 - 18.1 gm/dL 14.6   Hematocrit 40.0 - 52.0 % 43.9   MCV (Mean Corpuscular Volume) 80.0 - 100.0 fl  87.6   MCH (Mean Corpuscular Hemoglobin) 27.0 - 31.2 pg 29.1   MCHC (Mean Corpuscular Hemoglobin Concentration) 32.0 - 36.0 gm/dL 33.3   Platelet Count 150 - 450 10^3/uL 226   RDW-CV (Red Cell Distribution Width) 11.6 - 14.8 % 13.9   MPV (Mean Platelet Volume) 9.4 - 12.4 fl 11.1   Neutrophils 1.50 - 7.80 10^3/uL 2.49   Lymphocytes 1.00 - 3.60 10^3/uL 2.48   Monocytes 0.00 - 1.50 10^3/uL 0.83   Eosinophils 0.00 - 0.55 10^3/uL 0.47   Basophils 0.00 - 0.09 10^3/uL 0.08   Neutrophil % 32.0 - 70.0 % 39.0   Lymphocyte % 10.0 - 50.0 % 39.0   Monocyte % 4.0 - 13.0 % 13.1 High  Eosinophil % 1.0 - 5.0 % 7.4 High    Basophil% 0.0 - 2.0 % 1.3   Immature Granulocyte % <=0.7 % 0.2   Immature Granulocyte Count <=0.06 10^3/L 0.01   Resulting Agency  [facility] - [facility]  Specimen Collected: 11/15/20 07:16 Last Resulted: 11/15/20 08:09  Received From: [place]  Result Received: 01/31/21 15:00   Thyroid Stimulating Hormone (TSH) 0.450-5.330 uIU/ml uIU/mL 5.905 High    Resulting Agency  [facility] - [facility]  Specimen Collected: 11/15/20 07:16 Last Resulted: 11/15/20 09:54  Received From: [facility]  Result Received: 01/31/21 15:00   Hemoglobin A1C 4.2 - 5.6 % 5.8 High    Average Blood Glucose (Calc) mg/dL 120   Resulting [facility] - [facility]   Narrative Performed by [facility] - [facility] Normal Range:    4.2 - 5.6%  Increased Risk:  5.7 - 6.4%  Diabetes:        >= 6.5%  Glycemic Control for adults with diabetes:  <7%   Specimen Collected: 11/15/20 07:16 Last Resulted: 11/15/20 09:44  Received From: [facility]  Result Received: 01/31/21 15:00   Color Yellow, Violet, Light Violet, Dark Violet Yellow   Clarity Clear Clear   Specific Gravity 1.000 - 1.030 1.025   pH, Urine 5.0 - 8.0 5.0   Protein, Urinalysis Negative, Trace mg/dL Negative   Glucose, Urinalysis Negative mg/dL Negative   Ketones,  Urinalysis Negative mg/dL Negative   Blood, Urinalysis Negative Large Abnormal    Nitrite, Urinalysis Negative Negative   Leukocyte Esterase, Urinalysis Negative Moderate Abnormal    White Blood Cells, Urinalysis None Seen, 0-3 /hpf >50 Abnormal    Red Blood Cells, Urinalysis None Seen, 0-3 /hpf >50 Abnormal    Bacteria, Urinalysis None Seen /hpf Few Abnormal    Squamous Epithelial Cells, Urinalysis Rare, Few, None Seen /hpf Rare   Resulting Agency  [facility] - [facility]  Specimen Collected: 11/15/20 07:16 Last Resulted: 11/15/20 09:11  Received From: [facility]  Result Received: 01/31/21 15:00   Glucose 70 - 110 mg/dL 80   Sodium 136 - 145 mmol/L 141   Potassium 3.6 - 5.1 mmol/L 3.9   Chloride 97 - 109 mmol/L 107   Carbon Dioxide (CO2) 22.0 - 32.0 mmol/L 29.2   Urea Nitrogen (BUN) 7 - 25 mg/dL 21   Creatinine 0.7 - 1.3 mg/dL 1.4 High    Glomerular Filtration Rate (eGFR), MDRD Estimate >60 mL/min/1.73sq m 50 Low    Calcium 8.7 - 10.3 mg/dL 8.8   AST  8 - 39 U/L 12   ALT  6 - 57 U/L 10   Alk Phos (alkaline Phosphatase) 34 - 104 U/L 53   Albumin 3.5 - 4.8 g/dL 3.7   Bilirubin, Total 0.3 - 1.2 mg/dL 0.5   Protein, Total 6.1 - 7.9 g/dL 6.2   A/G Ratio 1.0 - 5.0 gm/dL 1.5   Resulting Agency  [facility] - LAB  Specimen Collected: 11/15/20 07:16 Last Resulted: 11/15/20 08:53  Received From: [place]  Result Received: 01/31/21 15:00    Urinalysis Component     Latest Ref Rng & Units 02/12/2021  Specific Gravity, UA     1.005 - 1.030 >1.030 (H)  pH, UA     5.0 - 7.5 5.0  Color, UA     Yellow Yellow  Appearance Ur     Clear Cloudy (A)  Leukocytes,UA  Negative 1+ (A)  Protein,UA     Negative/Trace 1+ (A)  Glucose, UA     Negative Trace (A)  Ketones, UA     Negative Trace (A)  RBC, UA     Negative 3+ (A)  Bilirubin, UA     Negative Negative  Urobilinogen, Ur     0.2 - 1.0 mg/dL 0.2  Nitrite, UA     Negative  Negative  Microscopic Examination      See below:   Component     Latest Ref Rng & Units 02/12/2021  WBC, UA     0 - 5 /hpf >30 (H)  RBC     0 - 2 /hpf 11-30 (A)  Epithelial Cells (non renal)     0 - 10 /hpf 0-10  Bacteria, UA     None seen/Few Few (A)  I have reviewed the labs.   Pertinent Imaging: Results for ANGAS, ISABELL (MRN [id number]) as of 02/12/2021 13:27  Ref. Range 02/12/2021 13:01  Scan Result Unknown 26    Assessment & Plan:   1. Urethral strictures -no longer cathing -no urinary complaints   2. BPH with LU TS -continue tamsulosin 0.4 mg daily and finasteride 5 mg daily  3. Gross hematuria -work up completed in 2021 - notable for urethral strictures -UA with 11-30 RBC's, but with pyuria and few bacteria -urine sent for culture -will prescribe an antibiotic when culture results as available if appropriate  Return for pending urine culture results .  These notes generated with voice recognition software. I apologize for typographical errors.  Keith Council, PA-C  [facility] [address]  [place], [place] [address] [phone]

## 2021-02-12 ENCOUNTER — Other Ambulatory Visit: Payer: Self-pay

## 2021-02-12 ENCOUNTER — Ambulatory Visit (INDEPENDENT_AMBULATORY_CARE_PROVIDER_SITE_OTHER): Payer: Medicare Other | Admitting: Urology

## 2021-02-12 VITALS — BP 167/85 | HR 81 | Ht 72.0 in | Wt 216.0 lb

## 2021-02-12 DIAGNOSIS — N401 Enlarged prostate with lower urinary tract symptoms: Secondary | ICD-10-CM | POA: Diagnosis not present

## 2021-02-12 DIAGNOSIS — N35919 Unspecified urethral stricture, male, unspecified site: Secondary | ICD-10-CM

## 2021-02-12 DIAGNOSIS — N138 Other obstructive and reflux uropathy: Secondary | ICD-10-CM | POA: Diagnosis not present

## 2021-02-12 DIAGNOSIS — R310 Gross hematuria: Secondary | ICD-10-CM | POA: Diagnosis not present

## 2021-02-12 LAB — BLADDER SCAN AMB NON-IMAGING: Scan Result: 26

## 2021-02-13 LAB — MICROSCOPIC EXAMINATION: WBC, UA: >30 /hpf — ABNORMAL HIGH (ref 0–5)

## 2021-02-13 LAB — URINALYSIS, COMPLETE
Bilirubin, UA: NEGATIVE
Nitrite, UA: NEGATIVE
Specific Gravity, UA: >1.03 — ABNORMAL HIGH (ref 1.005–1.030)
Urobilinogen, Ur: 0.2 mg/dL (ref 0.2–1.0)
pH, UA: 5 (ref 5.0–7.5)

## 2021-02-15 LAB — CULTURE, URINE COMPREHENSIVE

## 2021-03-13 ENCOUNTER — Encounter: Payer: Self-pay | Admitting: Urology

## 2021-03-19 ENCOUNTER — Ambulatory Visit (INDEPENDENT_AMBULATORY_CARE_PROVIDER_SITE_OTHER): Payer: Medicare Other | Admitting: Urology

## 2021-03-19 ENCOUNTER — Other Ambulatory Visit: Payer: Self-pay

## 2021-03-19 VITALS — BP 133/88 | HR 73 | Ht 72.0 in | Wt 210.0 lb

## 2021-03-19 DIAGNOSIS — R310 Gross hematuria: Secondary | ICD-10-CM

## 2021-03-19 DIAGNOSIS — N138 Other obstructive and reflux uropathy: Secondary | ICD-10-CM

## 2021-03-19 DIAGNOSIS — N35919 Unspecified urethral stricture, male, unspecified site: Secondary | ICD-10-CM

## 2021-03-19 DIAGNOSIS — N401 Enlarged prostate with lower urinary tract symptoms: Secondary | ICD-10-CM | POA: Diagnosis not present

## 2021-03-19 MED ORDER — CEPHALEXIN 250 MG PO CAPS
500.0000 mg | ORAL_CAPSULE | Freq: Once | ORAL | Status: AC
  Administered 2021-03-19: 500 mg via ORAL

## 2021-03-20 LAB — URINALYSIS, COMPLETE
Bilirubin, UA: NEGATIVE
Glucose, UA: NEGATIVE
Ketones, UA: NEGATIVE
Nitrite, UA: NEGATIVE
Specific Gravity, UA: >1.03 — ABNORMAL HIGH (ref 1.005–1.030)
Urobilinogen, Ur: 0.2 mg/dL (ref 0.2–1.0)
pH, UA: 5 (ref 5.0–7.5)

## 2021-03-20 LAB — MICROSCOPIC EXAMINATION
RBC, Urine: >30 /hpf — AB (ref 0–2)
WBC, UA: >30 /hpf — AB (ref 0–5)

## 2021-03-20 NOTE — Progress Notes (Signed)
   03/20/21  CC:  Chief Complaint  Patient presents with   Hematuria    HPI: 75 year old male with recurrent intermittent gross hematuria, history of urethral stricture and BPH who presents today for cystoscopy.  He had few questions about the cystoscopy but otherwise was agreeable to proceed.  Notably, he reports that he stopped cathing a few months ago.  He felt this was probably irritating and causing his hematuria.  He reports that his hematuria is for the most part terminal but if his bladder is very full, will be the beginning of voiding.  He is on finasteride and Flomax for BPH.  Blood pressure 133/88, pulse 73, height 6' (1.829 m), weight 210 lb (95.3 kg). NED. A&Ox3.   No respiratory distress   Abd soft, NT, ND Normal phallus with bilateral descended testicles  Cystoscopy Procedure Note  Patient identification was confirmed, informed consent was obtained, and patient was prepped using Betadine solution.  Lidocaine jelly was administered per urethral meatus.     Pre-Procedure: - Inspection reveals a normal caliber ureteral meatus.  Procedure: The flexible cystoscope was introduced without difficulty -Narrow bulbar urethra with concentric strictures as well as 2 false passages, each laterally.  I was able to advance a wire into the true lumen and advance the scope over the wire which did not require significant dilation. -Bilobar coaptation of an enlarged prostate with some friable mucosa, bleeding with manipulation -Trabeculated bladder with debris otherwise no masses or tumors identified   Post-Procedure: - Patient tolerated the procedure well  Given a dose of Keflex today due to the amount of manipulation necessary for cystoscopy as a precaution  Assessment/ Plan:  1. 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  Whether or not this is the etiology of his gross hematuria may be prostate  related  If strongly recommended resuming self cath at least twice per week starting tomorrow to keep his urethra patent  2. Gross hematuria Friable prostatic mucosa as well as significant bulbar urethral stricture, both of which could be the etiology of his recurrent gross hematuria  No additional upper tract or bladder pathology identified other than chronic outlet changes  3. BPH with obstruction/lower urinary tract symptoms Continue Flomax and finasteride   - cephALEXin (KEFLEX) capsule 500 mg - Urinalysis, Complete   F/u shannon in 6 months  Vanna Scotland, MD

## 2021-07-12 ENCOUNTER — Other Ambulatory Visit: Payer: Self-pay | Admitting: Urology

## 2021-07-12 DIAGNOSIS — N401 Enlarged prostate with lower urinary tract symptoms: Secondary | ICD-10-CM

## 2021-07-12 DIAGNOSIS — N138 Other obstructive and reflux uropathy: Secondary | ICD-10-CM

## 2021-07-12 DIAGNOSIS — R351 Nocturia: Secondary | ICD-10-CM

## 2021-07-22 ENCOUNTER — Inpatient Hospital Stay
Admission: EM | Admit: 2021-07-22 | Discharge: 2021-07-28 | DRG: 390 | Disposition: A | Discharge status: Home / Self Care | Payer: Medicare Other | Attending: Internal Medicine | Admitting: Internal Medicine

## 2021-07-22 ENCOUNTER — Other Ambulatory Visit: Payer: Self-pay

## 2021-07-22 ENCOUNTER — Emergency Department: Payer: Medicare Other

## 2021-07-22 DIAGNOSIS — K219 Gastro-esophageal reflux disease without esophagitis: Secondary | ICD-10-CM | POA: Diagnosis present

## 2021-07-22 DIAGNOSIS — E876 Hypokalemia: Secondary | ICD-10-CM | POA: Diagnosis present

## 2021-07-22 DIAGNOSIS — Z87891 Personal history of nicotine dependence: Secondary | ICD-10-CM | POA: Diagnosis not present

## 2021-07-22 DIAGNOSIS — R112 Nausea with vomiting, unspecified: Secondary | ICD-10-CM | POA: Diagnosis present

## 2021-07-22 DIAGNOSIS — K56609 Unspecified intestinal obstruction, unspecified as to partial versus complete obstruction: Secondary | ICD-10-CM | POA: Diagnosis present

## 2021-07-22 DIAGNOSIS — Z79899 Other long term (current) drug therapy: Secondary | ICD-10-CM

## 2021-07-22 DIAGNOSIS — Z20822 Contact with and (suspected) exposure to covid-19: Secondary | ICD-10-CM | POA: Diagnosis present

## 2021-07-22 DIAGNOSIS — I10 Essential (primary) hypertension: Secondary | ICD-10-CM | POA: Diagnosis present

## 2021-07-22 DIAGNOSIS — Z7952 Long term (current) use of systemic steroids: Secondary | ICD-10-CM

## 2021-07-22 DIAGNOSIS — M1990 Unspecified osteoarthritis, unspecified site: Secondary | ICD-10-CM | POA: Diagnosis present

## 2021-07-22 DIAGNOSIS — Z7951 Long term (current) use of inhaled steroids: Secondary | ICD-10-CM

## 2021-07-22 DIAGNOSIS — N400 Enlarged prostate without lower urinary tract symptoms: Secondary | ICD-10-CM | POA: Diagnosis present

## 2021-07-22 DIAGNOSIS — K565 Intestinal adhesions [bands], unspecified as to partial versus complete obstruction: Principal | ICD-10-CM | POA: Diagnosis present

## 2021-07-22 DIAGNOSIS — Z9049 Acquired absence of other specified parts of digestive tract: Secondary | ICD-10-CM | POA: Diagnosis not present

## 2021-07-22 DIAGNOSIS — J449 Chronic obstructive pulmonary disease, unspecified: Secondary | ICD-10-CM | POA: Diagnosis present

## 2021-07-22 DIAGNOSIS — Z888 Allergy status to other drugs, medicaments and biological substances status: Secondary | ICD-10-CM

## 2021-07-22 DIAGNOSIS — Z882 Allergy status to sulfonamides status: Secondary | ICD-10-CM

## 2021-07-22 DIAGNOSIS — R197 Diarrhea, unspecified: Secondary | ICD-10-CM | POA: Diagnosis present

## 2021-07-22 LAB — COMPREHENSIVE METABOLIC PANEL
ALT: 15 U/L (ref 0–44)
AST: 20 U/L (ref 15–41)
Albumin: 4.1 g/dL (ref 3.5–5.0)
Alkaline Phosphatase: 66 U/L (ref 38–126)
Anion gap: 9 (ref 5–15)
BUN: 24 mg/dL — ABNORMAL HIGH (ref 8–23)
CO2: 24 mmol/L (ref 22–32)
Calcium: 9.2 mg/dL (ref 8.9–10.3)
Chloride: 104 mmol/L (ref 98–111)
Creatinine, Ser: 1.15 mg/dL (ref 0.61–1.24)
GFR, Estimated: >60 mL/min (ref >60)
Glucose, Bld: 132 mg/dL — ABNORMAL HIGH (ref 70–99)
Potassium: 3.8 mmol/L (ref 3.5–5.1)
Sodium: 137 mmol/L (ref 135–145)
Total Bilirubin: 0.9 mg/dL (ref 0.3–1.2)
Total Protein: 7.9 g/dL (ref 6.5–8.1)

## 2021-07-22 LAB — CBC
HCT: 47.3 % (ref 39.0–52.0)
Hemoglobin: 15.6 g/dL (ref 13.0–17.0)
MCH: 29.3 pg (ref 26.0–34.0)
MCHC: 33 g/dL (ref 30.0–36.0)
MCV: 88.9 fL (ref 80.0–100.0)
Platelets: 249 10*3/uL (ref 150–400)
RBC: 5.32 MIL/uL (ref 4.22–5.81)
RDW: 14.6 % (ref 11.5–15.5)
WBC: 9.7 10*3/uL (ref 4.0–10.5)
nRBC: 0 % (ref 0.0–0.2)

## 2021-07-22 LAB — RESP PANEL BY RT-PCR (FLU A&B, COVID) ARPGX2
Influenza A by PCR: NEGATIVE
Influenza B by PCR: NEGATIVE
SARS Coronavirus 2 by RT PCR: NEGATIVE

## 2021-07-22 LAB — LIPASE, BLOOD: Lipase: 26 U/L (ref 11–51)

## 2021-07-22 MED ORDER — HEPARIN SODIUM (PORCINE) 5000 UNIT/ML IJ SOLN
5000.0000 [IU] | Freq: Three times a day (TID) | INTRAMUSCULAR | Status: DC
  Administered 2021-07-23 – 2021-07-28 (×17): 5000 [IU] via SUBCUTANEOUS
  Filled 2021-07-22 (×17): qty 1

## 2021-07-22 MED ORDER — SODIUM CHLORIDE 0.9 % IV SOLN: INTRAVENOUS | Status: AC

## 2021-07-22 MED ORDER — SODIUM CHLORIDE 0.9 % IV SOLN: Freq: Once | INTRAVENOUS | Status: AC

## 2021-07-22 MED ORDER — ONDANSETRON HCL 4 MG PO TABS: 4.0000 mg | ORAL_TABLET | Freq: Four times a day (QID) | ORAL | Status: DC | PRN

## 2021-07-22 MED ORDER — ONDANSETRON HCL 4 MG/2ML IJ SOLN
4.0000 mg | Freq: Four times a day (QID) | INTRAMUSCULAR | Status: DC | PRN
  Administered 2021-07-23: 4 mg via INTRAVENOUS
  Filled 2021-07-22: qty 2

## 2021-07-22 MED ORDER — HYDRALAZINE HCL 20 MG/ML IJ SOLN
10.0000 mg | Freq: Four times a day (QID) | INTRAMUSCULAR | Status: DC
  Administered 2021-07-23 – 2021-07-28 (×22): 10 mg via INTRAVENOUS
  Filled 2021-07-22 (×22): qty 1

## 2021-07-22 MED ORDER — ONDANSETRON 4 MG PO TBDP
4.0000 mg | ORAL_TABLET | Freq: Once | ORAL | Status: AC
  Administered 2021-07-22: 4 mg via ORAL
  Filled 2021-07-22: qty 1

## 2021-07-22 MED ORDER — ONDANSETRON HCL 4 MG/2ML IJ SOLN
4.0000 mg | Freq: Once | INTRAMUSCULAR | Status: AC
  Administered 2021-07-22: 4 mg via INTRAVENOUS
  Filled 2021-07-22: qty 2

## 2021-07-22 NOTE — ED Triage Notes (Signed)
Pt c/o generalized abd pain that started last night and today having N/V/D.Marland Kitchen states he has a hx of blockage

## 2021-07-22 NOTE — ED Provider Notes (Signed)
Emergency Medicine Provider Triage Evaluation Note  Keith Pagan., a 75 y.o. male  was evaluated in triage.  Pt complains of generalized abdominal pain with onset last night.  Patient also reports today having nausea, vomiting, diarrhea.  He gives a history of SBO.  Review of Systems  Positive: Generalized ABD pain, NVD Negative: FCS  Physical Exam  BP (!) 160/103 (BP Location: Left Arm)   Pulse 82   Temp 97.6 F (36.4 C)   Resp 18   Ht 6' (1.829 m)   Wt 96.2 kg   SpO2 98%   BMI 28.75 kg/m  Gen:   Awake, no distress  NAD Resp:  Normal effort CTA MSK:   Moves extremities without difficulty  Other:  ABD: distended, soft, normal bowel sounds  Medical Decision Making  Medically screening exam initiated at 2:55 PM.  Appropriate orders placed.  Keith Pagan. was informed that the remainder of the evaluation will be completed by another provider, this initial triage assessment does not replace that evaluation, and the importance of remaining in the ED until their evaluation is complete.  Patient with ED evaluation of generalized abdominal pain with associated nausea, vomiting, diarrhea with a history of SBO.   Lissa Hoard, PA-C 07/22/21 1507    Dionne Bucy, MD 07/22/21 1925

## 2021-07-22 NOTE — H&P (Signed)
History and Physical    Keith Craig. [id number] DOB: 02/20/1946 DOA: 07/22/2021  PCP: Idelle Crouch, MD    Patient coming from:  Home    Chief Complaint:  Abdominal pain.   HPI:  Keith Craig. is a 75 y.o. male seen in ed with complaints of abdominal pain, since midnight and multiple episode of vomiting.  Pt also had diarrhea. Pt has h/o SBO in past that resolved with lysis.  Pt had multiple episode of vomiting.  Patient is alert awake oriented answers questions and gives history.  States that his last admission he did not need surgical intervention.  Pt has past medical history of small bowel obstruction. ED Course:  Vitals:   07/22/21 1741 07/22/21 2215 07/23/21 0003 07/23/21 0028  BP: (!) 150/98 (!) 152/84  (!) 155/108  Pulse: 80 (!) 101  96  Resp: 16 13  16   Temp:   98.1 F (36.7 C) 97.9 F (36.6 C)  TempSrc:   Oral Oral  SpO2: 98% 97%  99%  Weight:      Height:      In the emergency room patient is alert awake hypertensive otherwise vitals are stable.  Blood work today shows normal CMP, normal CBC, negative flu and COVID panel.  Review of Systems:  Review of Systems  Gastrointestinal:  Positive for diarrhea, nausea and vomiting.  All other systems reviewed and are negative.   Past Medical History:  Diagnosis Date   Arthritis    Benign essential microscopic hematuria    BPH with obstruction/lower urinary tract symptoms    Chills    Chronic airway obstruction (HCC)    Chronic obstructive asthma (HCC)    Esophageal reflux    HTN (hypertension)    Over weight     Past Surgical History:  Procedure Laterality Date   cataract surgery Bilateral    CHOLECYSTECTOMY     CYSTOSCOPY W/ RETROGRADES Right 06/25/2020   Procedure: CYSTOSCOPY WITH RETROGRADE PYELOGRAM;  Surgeon: Hollice Espy, MD;  Location: [facility] ORS;  Service: Urology;  Laterality: Right;   CYSTOSCOPY WITH DIRECT VISION INTERNAL URETHROTOMY N/A 06/25/2020   Procedure:  CYSTOSCOPY WITH DIRECT VISION INTERNAL URETHROTOMY;  Surgeon: Hollice Espy, MD;  Location: [facility];  Service: Urology;  Laterality: N/A;   CYSTOSCOPY WITH RETROGRADE URETHROGRAM N/A 06/25/2020   Procedure: CYSTOSCOPY WITH RETROGRADE URETHROGRAM;  Surgeon: Hollice Espy, MD;  Location: [facility];  Service: Urology;  Laterality: N/A;   CYSTOSCOPY WITH URETHRAL DILATATION N/A 06/25/2020   Procedure: CYSTOSCOPY WITH URETHRAL DILATATION;  Surgeon: Hollice Espy, MD;  Location: [facility];  Service: Urology;  Laterality: N/A;   HIATAL HERNIA REPAIR     microvascular decompression of left trigeminal nerve     RHIZOTOMY     small bowel restrictions     URETHRAL STRICTURE DILATATION       reports that he quit smoking about 51 years ago. His smoking use included cigarettes. He has never used smokeless tobacco. He reports that he does not drink alcohol and does not use drugs.  Allergies  Allergen Reactions   Bactrim [Sulfamethoxazole-Trimethoprim] Nausea And Vomiting   Vitamin B12 [Cyanocobalamin] Itching and Rash    Injection, tolerates the tablets     Family History  Problem Relation Age of Onset   Esophagitis Mother    Arthritis/Rheumatoid Father    Kidney disease Neg Hx    Prostate cancer Neg Hx    Kidney cancer Neg Hx    Bladder Cancer Neg Hx  Prior to Admission medications   Medication Sig Start Date End Date Taking? Authorizing Provider  albuterol (PROVENTIL HFA;VENTOLIN HFA) 108 (90 BASE) MCG/ACT inhaler Inhale 2 puffs into the lungs every 6 (six) hours as needed for wheezing or shortness of breath.    [provider]  cloNIDine (CATAPRES) 0.2 MG tablet Take 0.2 mg by mouth 2 (two) times daily.    [provider]  finasteride (PROSCAR) 5 MG tablet TAKE 1 TABLET BY MOUTH EVERY DAY 07/12/21   McGowan, Larene Beach A, PA-C  fluticasone (FLONASE) 50 MCG/ACT nasal spray Place 1 spray into both nostrils daily as needed for allergies or rhinitis.    [provider]  Fluticasone-Salmeterol (ADVAIR) 500-50 MCG/DOSE AEPB Inhale 1 puff into the lungs 2 (two) times daily.    [provider]  metoprolol tartrate (LOPRESSOR) 25 MG tablet Take 1 tablet by mouth daily. 07/20/14   [provider]  montelukast (SINGULAIR) 10 MG tablet Take 10 mg by mouth at bedtime.     [provider]  pantoprazole (PROTONIX) 20 MG tablet Take 20 mg by mouth 2 (two) times daily.    [provider]  predniSONE (DELTASONE) 5 MG tablet Take 5 mg by mouth daily with breakfast.    [provider]  tamsulosin (FLOMAX) 0.4 MG CAPS capsule TAKE 1 CAPSULE BY MOUTH EVERY DAY 07/12/21   McGowan, Larene Beach A, PA-C  valsartan (DIOVAN) 80 MG tablet Take 80 mg by mouth 2 (two) times daily.     [provider]    Physical Exam: Vitals:   07/22/21 1741 07/22/21 2215 07/23/21 0003 07/23/21 0028  BP: (!) 150/98 (!) 152/84  (!) 155/108  Pulse: 80 (!) 101  96  Resp: 16 13  16   Temp:   98.1 F (36.7 C) 97.9 F (36.6 C)  TempSrc:   Oral Oral  SpO2: 98% 97%  99%  Weight:      Height:       Physical Exam Vitals reviewed.  Constitutional:      General: He is not in acute distress.    Appearance: He is not ill-appearing.  HENT:     Head: Normocephalic and atraumatic.     Right Ear: External ear normal.     Left Ear: External ear normal.     Nose: Nose normal.     Mouth/Throat:     Mouth: Mucous membranes are moist.  Eyes:     Extraocular Movements: Extraocular movements intact.     Pupils: Pupils are equal, round, and reactive to light.  Neck:     Vascular: No carotid bruit.  Cardiovascular:     Rate and Rhythm: Normal rate and regular rhythm.     Pulses: Normal pulses.     Heart sounds: Normal heart sounds.  Pulmonary:     Effort: Pulmonary effort is normal.     Breath sounds: Normal breath sounds.  Abdominal:     General: Bowel sounds are normal. There is distension.     Palpations: Abdomen is soft. There is no mass.      Tenderness: There is generalized abdominal tenderness. There is guarding.     Hernia: No hernia is present.  Genitourinary:    Testes:        Right: Swelling not present.     Prostate: Normal.     Rectum: Normal.  Musculoskeletal:     Right lower leg: No edema.     Left lower leg: No edema.  Skin:  General: Skin is warm.  Neurological:     General: No focal deficit present.     Mental Status: He is alert and oriented to person, place, and time.  Psychiatric:        Mood and Affect: Mood normal.        Behavior: Behavior normal.     Labs on Admission: I have personally reviewed following labs and imaging studies  No results for input(s): CKTOTAL, CKMB, TROPONINI in the last 72 hours. Lab Results  Component Value Date   WBC 9.7 07/22/2021   HGB 15.6 07/22/2021   HCT 47.3 07/22/2021   MCV 88.9 07/22/2021   PLT 249 07/22/2021    Recent Labs  Lab 07/22/21 1432  NA 137  K 3.8  CL 104  CO2 24  BUN 24*  CREATININE 1.15  CALCIUM 9.2  PROT 7.9  BILITOT 0.9  ALKPHOS 66  ALT 15  AST 20  GLUCOSE 132*   No results found for: CHOL, HDL, LDLCALC, TRIG No results found for: DDIMER Invalid input(s): POCBNP   COVID-19 Labs No results for input(s): DDIMER, FERRITIN, LDH, CRP in the last 72 hours. Lab Results  Component Value Date   [place] NEGATIVE 07/22/2021   [place] NEGATIVE 06/21/2020   [place] NEGATIVE 06/07/2020    Radiological Exams on Admission: CT ABDOMEN PELVIS WO CONTRAST  Result Date: 07/22/2021 CLINICAL DATA:  Bowel obstruction suspected. Generalized abdominal pain. Nausea, vomiting. EXAM: CT ABDOMEN AND PELVIS WITHOUT CONTRAST TECHNIQUE: Multidetector CT imaging of the abdomen and pelvis was performed following the standard protocol without IV contrast. COMPARISON:  05/14/2020 FINDINGS: Lower chest: Moderate-sized hiatal hernia.  No acute abnormality Hepatobiliary: No focal liver abnormality is seen. Status post cholecystectomy. No biliary  dilatation. Pancreas: No focal abnormality or ductal dilatation. Spleen: No focal abnormality.  Normal size. Adrenals/Urinary Tract: 6 cm cyst in the midpole of the left kidney, unchanged. No hydronephrosis or stones. Adrenal glands unremarkable. Small diverticulum off the left lateral wall of the bladder, otherwise unremarkable. Stomach/Bowel: Normal appendix. Few scattered colonic diverticula. No active diverticulitis. Dilated central abdominal small bowel loops. Distal small bowel is decompressed. Transition appears to be in the right lobe abdomen/upper pelvis in an area that appears tethered, presumably adhesions. Stomach is decompressed. Vascular/Lymphatic: Aortic atherosclerosis. No evidence of aneurysm or adenopathy. Reproductive: Mildly prominent prostate with central calcifications. Other: No free fluid or free air. Bilateral inguinal hernias containing fat. Musculoskeletal: No acute bony abnormality. IMPRESSION: Dilated central small bowel loops with decompressed distal small bowel compatible with small-bowel obstruction. This appears to be due to adhesions in the right lower abdomen/upper pelvis. Aortic atherosclerosis. Moderate-sized hiatal hernia. Electronically Signed   By: Rolm Baptise M.D.   On: 07/22/2021 17:54   DG Abd Portable 1 View  Result Date: 07/22/2021 CLINICAL DATA:  NG tube placement EXAM: PORTABLE ABDOMEN - 1 VIEW COMPARISON:  07/29/2017 FINDINGS: NG tube tip is in the proximal stomach. Moderate-sized hiatal hernia. Mildly prominent small bowel loops. IMPRESSION: NG tube in the proximal stomach. Electronically Signed   By: Rolm Baptise M.D.   On: 07/22/2021 23:15    EKG: Independently reviewed.  None  Assessment/Plan: Principal Problem:   SBO (small bowel obstruction) (HCC) Active Problems:   Nausea vomiting and diarrhea   Esophageal reflux   COPD (chronic obstructive pulmonary disease) (HCC)   HTN (hypertension) Small bowel obstruction/nausea vomiting diarrhea: Admit  patient to med telemetry floor with IV fluids. N.p.o., IV fluids normal saline at 75 for 2 days. If symptoms  not resolved consider general surgery consult. IV PPI therapy.   Supportive abortive care with antiemetics antipyretics IV PPI and IV fluids.  Esophageal reflux: Will start patient on PPI therapy.  COPD: As needed MDI, Advair, Proventil.  Hypertension: Blood pressure (!) 155/108, pulse 96, temperature 97.9 F (36.6 C), temperature source Oral, resp. rate 16, height 6' (1.829 m), weight 96.2 kg, SpO2 99 %. Home medication regimen of clonidine changed to 0.2 milligram patch in addition to scheduled hydralazine IV  DVT prophylaxis:  scd's / hjeparin.   Code Status:  Full code    Family Communication:  Kawecki,Carol (Spouse)  [phone] (Mobile)   Disposition Plan:  Home    Consults called:  None   Admission status: Inpatient.     Gertha Calkin MD [facility] [phone] How to contact the [facility] Attending or Consulting provider 7A - 7P or covering provider during after hours 7P -7A, for this patient.    Check the care team in [facility] and look for a) attending/consulting [facility] provider listed and b) the [facility] team listed Log into [email or web address] and use [facility] universal password to access. If you do not have the password, please contact the hospital operator. Locate the [facility] provider you are looking for under [facility] and page to a number that you can be directly reached. If you still have difficulty reaching the provider, please page the [facility] (Director on Call) for the Hospitalists listed on amion for assistance. [email or web address] Password [facility] 07/23/2021, 12:37 AM

## 2021-07-22 NOTE — ED Provider Notes (Signed)
[facility] Emergency Department Provider Note   ____________________________________________   I have reviewed the triage vital signs and the nursing notes.   HISTORY  Chief Complaint Abdominal Pain   History limited by: Not Limited   HPI Keith Craig. is a 75 y.o. male who presents to the emergency department today because of concerns for abdominal distention, pain, nausea vomiting.  The patient states that the abdominal distention and pain that started last night.  It then progressed throughout the day.  He has now had multiple episodes of nausea and vomiting.  He did state that he had some diarrhea in the waiting room and that his abdomen does not feel quite as tight as it did however he continues to complain of significant nausea.  Patient has had small bowel obstructions in the past.  Records reviewed. Per medical record review patient has a history of COPD  Past Medical History:  Diagnosis Date   Arthritis    Benign essential microscopic hematuria    BPH with obstruction/lower urinary tract symptoms    Chills    Chronic airway obstruction (HCC)    Chronic obstructive asthma (HCC)    Esophageal reflux    HTN (hypertension)    Over weight     Patient Active Problem List   Diagnosis Date Noted   Nocturia 01/03/2016   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   SOB (shortness of breath) 01/25/2015   B12 deficiency 01/18/2014   COPD (chronic obstructive pulmonary disease) ([place]) 01/18/2014   DDD (degenerative disc disease) 01/18/2014   HTN (hypertension) 01/18/2014   OA (osteoarthritis) 01/18/2014    Past Surgical History:  Procedure Laterality Date   cataract surgery Bilateral    CHOLECYSTECTOMY     CYSTOSCOPY W/ RETROGRADES Right 06/25/2020   Procedure: CYSTOSCOPY WITH RETROGRADE PYELOGRAM;  Surgeon: Hollice Espy, MD;  Location: [facility] ORS;  Service: Urology;  Laterality: Right;   CYSTOSCOPY WITH DIRECT VISION  INTERNAL URETHROTOMY N/A 06/25/2020   Procedure: CYSTOSCOPY WITH DIRECT VISION INTERNAL URETHROTOMY;  Surgeon: Hollice Espy, MD;  Location: [facility] ORS;  Service: Urology;  Laterality: N/A;   CYSTOSCOPY WITH RETROGRADE URETHROGRAM N/A 06/25/2020   Procedure: CYSTOSCOPY WITH RETROGRADE URETHROGRAM;  Surgeon: Hollice Espy, MD;  Location: [facility] ORS;  Service: Urology;  Laterality: N/A;   CYSTOSCOPY WITH URETHRAL DILATATION N/A 06/25/2020   Procedure: CYSTOSCOPY WITH URETHRAL DILATATION;  Surgeon: Hollice Espy, MD;  Location: [facility] OR[facility];  Service: Urology;  Laterality: N/A;   HIATAL HERNIA REPAIR     microvascular decompression of left trigeminal nerve     RHIZOTOMY     small bowel restrictions     URETHRAL STRICTURE DILATATION      Prior to Admission medications   Medication Sig Start Date End Date Taking? Authorizing Provider  albuterol (PROVENTIL HFA;VENTOLIN HFA) 108 (90 BASE) MCG/ACT inhaler Inhale 2 puffs into the lungs every 6 (six) hours as needed for wheezing or shortness of breath.    [provider]  cloNIDine (CATAPRES) 0.2 MG tablet Take 0.2 mg by mouth 2 (two) times daily.    [provider]  finasteride (PROSCAR) 5 MG tablet TAKE 1 TABLET BY MOUTH EVERY DAY 07/12/21   McGowan, Larene Beach A, PA-C  fluticasone (FLONASE) 50 MCG/ACT nasal spray Place 1 spray into both nostrils daily as needed for allergies or rhinitis.    [provider]  Fluticasone-Salmeterol (ADVAIR) 500-50 MCG/DOSE AEPB Inhale 1 puff into the lungs 2 (two) times daily.    [provider]  metoprolol tartrate (LOPRESSOR) 25 MG tablet Take 1 tablet by mouth daily. 07/20/14   [provider]  montelukast (SINGULAIR) 10 MG tablet Take 10 mg by mouth at bedtime.     [provider]  pantoprazole (PROTONIX) 20 MG tablet Take 20 mg by mouth 2 (two) times daily.    [provider]  predniSONE (DELTASONE) 5 MG tablet Take 5 mg by mouth daily with breakfast.     [provider]  tamsulosin (FLOMAX) 0.4 MG CAPS capsule TAKE 1 CAPSULE BY MOUTH EVERY DAY 07/12/21   McGowan, Carollee Herter A, PA-C  valsartan (DIOVAN) 80 MG tablet Take 80 mg by mouth 2 (two) times daily.     [provider]    Allergies Bactrim [sulfamethoxazole-trimethoprim] and Vitamin b12 [cyanocobalamin]  Family History  Problem Relation Age of Onset   Arthritis/Rheumatoid Father    Kidney disease Neg Hx    Prostate cancer Neg Hx    Kidney cancer Neg Hx    Bladder Cancer Neg Hx     Social History Social History   Tobacco Use   Smoking status: Former    Types: Cigarettes    Quit date: 1971    Years since quitting: 51.9   Smokeless tobacco: Never  Vaping Use   Vaping Use: Never used  Substance Use Topics   Alcohol use: No    Alcohol/week: 0.0 standard drinks   Drug use: No    Review of Systems Constitutional: No fever/chills Eyes: No visual changes. ENT: No sore throat. Cardiovascular: Denies chest pain. Respiratory: Denies shortness of breath. Gastrointestinal: Positive for abdominal pain, nausea and vomiting.  Genitourinary: Negative for dysuria. Musculoskeletal: Negative for back pain. Skin: Negative for rash. Neurological: Negative for headaches, focal weakness or numbness.  ____________________________________________   PHYSICAL EXAM:  VITAL SIGNS: ED Triage Vitals  Enc Vitals Group     BP 07/22/21 1430 (!) 160/103     Pulse Rate 07/22/21 1430 82     Resp 07/22/21 1430 18     Temp 07/22/21 1434 97.6 F (36.4 C)     Temp Source 07/22/21 1430 Oral     SpO2 07/22/21 1430 98 %     Weight 07/22/21 1430 212 lb (96.2 kg)     Height 07/22/21 1430 6' (1.829 m)     Head Circumference --      Peak Flow --      Pain Score 07/22/21 1430 8    Constitutional: Alert and oriented.  Eyes: Conjunctivae are normal.  ENT      Head: Normocephalic and atraumatic.      Nose: No congestion/rhinnorhea.      Mouth/Throat: Mucous membranes are  moist.      Neck: No stridor. Hematological/Lymphatic/Immunilogical: No cervical lymphadenopathy. Cardiovascular: Normal rate, regular rhythm.  No murmurs, rubs, or gallops.  Respiratory: Normal respiratory effort without tachypnea nor retractions. Breath sounds are clear and equal bilaterally. No wheezes/rales/rhonchi. Gastrointestinal: Distended. Mildly tender to palpation.  Genitourinary: Deferred Musculoskeletal: Normal range of motion in all extremities. No lower extremity edema. Neurologic:  Normal speech and language. No gross focal neurologic deficits are appreciated.  Skin:  Skin is warm, dry and intact. No rash noted. Psychiatric: Mood and affect are normal. Speech and behavior are normal. Patient exhibits appropriate insight and judgment.  ____________________________________________    LABS (pertinent positives/negatives)  Lipase 26 CBC wbc 9.7, hgb 15.6, plt 249 CMP wnl except glu 132, BUN 24  ____________________________________________   EKG  None  ____________________________________________  RADIOLOGY  CT abd/pel Concern for SBO  ____________________________________________   PROCEDURES  Procedures  ____________________________________________   INITIAL IMPRESSION / ASSESSMENT AND PLAN / ED COURSE  Pertinent labs & imaging results that were available during my care of the patient were reviewed by me and considered in my medical decision making (see chart for details).   Patient presents to the emergency department today because of concerns for nominal distention pain, nausea vomiting.  While the patient did have some diarrhea in the waiting room continues to complain of significant nausea.  CT scan is concerning for small bowel obstruction.  Patient has had SBO in the past.  Will place NG in ER and plan on admission to the hospital for further work up and evaluation.   ____________________________________________   FINAL CLINICAL IMPRESSION(S)  / ED DIAGNOSES  Final diagnoses:  Small bowel obstruction ([place])     Note: This dictation was prepared with Dragon dictation. Any transcriptional errors that result from this process are unintentional     Nance Pear, MD 07/22/21 2237

## 2021-07-23 ENCOUNTER — Encounter: Payer: Self-pay | Admitting: Internal Medicine

## 2021-07-23 DIAGNOSIS — K56609 Unspecified intestinal obstruction, unspecified as to partial versus complete obstruction: Secondary | ICD-10-CM

## 2021-07-23 LAB — CBC
HCT: 44.2 % (ref 39.0–52.0)
Hemoglobin: 15.2 g/dL (ref 13.0–17.0)
MCH: 29.9 pg (ref 26.0–34.0)
MCHC: 34.4 g/dL (ref 30.0–36.0)
MCV: 87 fL (ref 80.0–100.0)
Platelets: 246 10*3/uL (ref 150–400)
RBC: 5.08 MIL/uL (ref 4.22–5.81)
RDW: 14.4 % (ref 11.5–15.5)
WBC: 11.4 10*3/uL — ABNORMAL HIGH (ref 4.0–10.5)
nRBC: 0 % (ref 0.0–0.2)

## 2021-07-23 LAB — COMPREHENSIVE METABOLIC PANEL
ALT: 13 U/L (ref 0–44)
AST: 17 U/L (ref 15–41)
Albumin: 3.7 g/dL (ref 3.5–5.0)
Alkaline Phosphatase: 63 U/L (ref 38–126)
Anion gap: 8 (ref 5–15)
BUN: 29 mg/dL — ABNORMAL HIGH (ref 8–23)
CO2: 24 mmol/L (ref 22–32)
Calcium: 8.9 mg/dL (ref 8.9–10.3)
Chloride: 106 mmol/L (ref 98–111)
Creatinine, Ser: 1.07 mg/dL (ref 0.61–1.24)
GFR, Estimated: >60 mL/min (ref >60)
Glucose, Bld: 113 mg/dL — ABNORMAL HIGH (ref 70–99)
Potassium: 4 mmol/L (ref 3.5–5.1)
Sodium: 138 mmol/L (ref 135–145)
Total Bilirubin: 1 mg/dL (ref 0.3–1.2)
Total Protein: 7.2 g/dL (ref 6.5–8.1)

## 2021-07-23 MED ORDER — SODIUM CHLORIDE 0.9 % IV SOLN
12.5000 mg | Freq: Three times a day (TID) | INTRAVENOUS | Status: DC | PRN
  Filled 2021-07-23 (×3): qty 0.5

## 2021-07-23 MED ORDER — CLONIDINE HCL 0.2 MG/24HR TD PTWK
0.2000 mg | MEDICATED_PATCH | TRANSDERMAL | Status: DC
  Administered 2021-07-23: 0.2 mg via TRANSDERMAL
  Filled 2021-07-23 (×2): qty 1

## 2021-07-23 NOTE — TOC Initial Note (Signed)
Transition of Care (TOC) - Initial/Assessment Note    Patient Details  Name: Keith Craig. MRN: [id number] Date of Birth: 1945-08-20  Transition of Care [facility]) CM/SW Contact:    Chapman Fitch, RN Phone Number: 07/23/2021, 9:25 AM  Clinical Narrative:                   Transition of Care [facility]) Screening Note   Patient Details  Name: Keith Craig. Date of Birth: 01/06/1946   Transition of Care [facility]) CM/SW Contact:    Chapman Fitch, RN Phone Number: 07/23/2021, 9:25 AM    Transition of Care Department [facility]) has reviewed patient and no TOC needs have been identified at this time. We will continue to monitor patient advancement through interdisciplinary progression rounds. If new patient transition needs arise, please place a TOC consult.         Patient Goals and CMS Choice        Expected Discharge Plan and Services                                                Prior Living Arrangements/Services                       Activities of Daily Living Home Assistive Devices/Equipment: None ADL Screening (condition at time of admission) Patient's cognitive ability adequate to safely complete daily activities?: Yes Is the patient deaf or have difficulty hearing?: Yes Does the patient have difficulty seeing, even when wearing glasses/contacts?: No Does the patient have difficulty concentrating, remembering, or making decisions?: No Patient able to express need for assistance with ADLs?: Yes Does the patient have difficulty dressing or bathing?: No Independently performs ADLs?: Yes (appropriate for developmental age) Does the patient have difficulty walking or climbing stairs?: Yes Weakness of Legs: Right Weakness of Arms/Hands: None  Permission Sought/Granted                  Emotional Assessment              Admission diagnosis:  Small bowel obstruction (HCC) [K56.609] SBO (small bowel obstruction) (HCC)  [K56.609] Patient Active Problem List   Diagnosis Date Noted   Esophageal reflux 07/22/2021   Nausea vomiting and diarrhea 07/22/2021   SBO (small bowel obstruction) (HCC) 07/29/2017   Nocturia 01/03/2016   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   SOB (shortness of breath) 01/25/2015   B12 deficiency 01/18/2014   COPD (chronic obstructive pulmonary disease) (HCC) 01/18/2014   DDD (degenerative disc disease) 01/18/2014   HTN (hypertension) 01/18/2014   OA (osteoarthritis) 01/18/2014   PCP:  Marguarite Arbour, MD Pharmacy:   [facility] [address] [place] - [address] [place] [address] Phone: [phone] Fax: [phone]  [id number] [place], [place] - [address] AT [facility] [address] [place] [address] Phone: [phone] Fax: [phone]     Social Determinants of Health (SDOH) Interventions    Readmission Risk Interventions No flowsheet data found.

## 2021-07-23 NOTE — Consult Note (Signed)
[facility] SURGICAL CONSULTATION NOTE (initial) - [id number]   HISTORY OF PRESENT ILLNESS (HPI):  75 y.o. male presented to [facility] ED yesterday for evaluation of abdominal pain. Patient reports the acute onset of central abdominal pain around 24 hours ago or so. This was a burning type pain he described. The abdominal pain was accompanied by abdominal distension, nausea, and numerous episodes of emesis in the ED waiting area. No fever, chills, cough, SOB, CP, or urinary changes. He has a history of small bowel obstruction in the past. Upon chart review, he had bowel obstruction in June of 2015 which did require laparotomy and lysis of adhesion with Dr Rochel Brome, MD. He had an additional admission in December of 2018 for SBO, but this resolved with conservative measures. Previous abdominal surgical history positive for cholecystectomy and hiatal hernia repair, both reportedly with Dr Rochel Brome, MD as well (I can not find records of this). Laboratory work up in the ED was reassuring. He did have CT Abdomen/Pelvis which was concerning for possible SBO. He had NGT placed in the ED and was admitted to medicine.   Surgery is consulted by hospitalist physician Dr. Nolberto Hanlon, MD in this context for evaluation and management of SBO.   PAST MEDICAL HISTORY (PMH):  Past Medical History:  Diagnosis Date   Arthritis    Benign essential microscopic hematuria    BPH with obstruction/lower urinary tract symptoms    Chills    Chronic airway obstruction (HCC)    Chronic obstructive asthma (HCC)    Esophageal reflux    HTN (hypertension)    Over weight      PAST SURGICAL HISTORY ([place]):  Past Surgical History:  Procedure Laterality Date   cataract surgery Bilateral    CHOLECYSTECTOMY     CYSTOSCOPY W/ RETROGRADES Right 06/25/2020   Procedure: CYSTOSCOPY WITH RETROGRADE PYELOGRAM;  Surgeon: Hollice Espy, MD;  Location: [facility] ORS;  Service: Urology;  Laterality: Right;   CYSTOSCOPY  WITH DIRECT VISION INTERNAL URETHROTOMY N/A 06/25/2020   Procedure: CYSTOSCOPY WITH DIRECT VISION INTERNAL URETHROTOMY;  Surgeon: Hollice Espy, MD;  Location: [facility] ORS;  Service: Urology;  Laterality: N/A;   CYSTOSCOPY WITH RETROGRADE URETHROGRAM N/A 06/25/2020   Procedure: CYSTOSCOPY WITH RETROGRADE URETHROGRAM;  Surgeon: Hollice Espy, MD;  Location: [facility] ORS;  Service: Urology;  Laterality: N/A;   CYSTOSCOPY WITH URETHRAL DILATATION N/A 06/25/2020   Procedure: CYSTOSCOPY WITH URETHRAL DILATATION;  Surgeon: Hollice Espy, MD;  Location: [facility] ORS;  Service: Urology;  Laterality: N/A;   HIATAL HERNIA REPAIR     microvascular decompression of left trigeminal nerve     RHIZOTOMY     small bowel restrictions     URETHRAL STRICTURE DILATATION       MEDICATIONS:  Prior to Admission medications   Medication Sig Start Date End Date Taking? Authorizing Provider  albuterol (PROVENTIL HFA;VENTOLIN HFA) 108 (90 BASE) MCG/ACT inhaler Inhale 2 puffs into the lungs every 6 (six) hours as needed for wheezing or shortness of breath.    [provider]  cloNIDine (CATAPRES) 0.2 MG tablet Take 0.2 mg by mouth 2 (two) times daily.    [provider]  finasteride (PROSCAR) 5 MG tablet TAKE 1 TABLET BY MOUTH EVERY DAY 07/12/21   McGowan, Larene Beach A, PA-C  fluticasone (FLONASE) 50 MCG/ACT nasal spray Place 1 spray into both nostrils daily as needed for allergies or rhinitis.    [provider]  Fluticasone-Salmeterol (ADVAIR) 500-50 MCG/DOSE AEPB Inhale 1 puff into the lungs  2 (two) times daily.    [provider]  metoprolol tartrate (LOPRESSOR) 25 MG tablet Take 1 tablet by mouth daily. 07/20/14   [provider]  montelukast (SINGULAIR) 10 MG tablet Take 10 mg by mouth at bedtime.     [provider]  pantoprazole (PROTONIX) 20 MG tablet Take 20 mg by mouth 2 (two) times daily.    [provider]  predniSONE (DELTASONE) 5 MG tablet Take 5 mg by  mouth daily with breakfast.    [provider]  tamsulosin (FLOMAX) 0.4 MG CAPS capsule TAKE 1 CAPSULE BY MOUTH EVERY DAY 07/12/21   McGowan, Carollee Herter A, PA-C  valsartan (DIOVAN) 80 MG tablet Take 80 mg by mouth 2 (two) times daily.     [provider]     ALLERGIES:  Allergies  Allergen Reactions   Bactrim [Sulfamethoxazole-Trimethoprim] Nausea And Vomiting   Vitamin B12 [Cyanocobalamin] Itching and Rash    Injection, tolerates the tablets      SOCIAL HISTORY:  Social History   Socioeconomic History   Marital status: Married    Spouse name: Not on file   Number of children: Not on file   Years of education: Not on file   Highest education level: Not on file  Occupational History   Not on file  Tobacco Use   Smoking status: Former    Types: Cigarettes    Quit date: 1971    Years since quitting: 51.9   Smokeless tobacco: Never  Vaping Use   Vaping Use: Never used  Substance and Sexual Activity   Alcohol use: No    Alcohol/week: 0.0 standard drinks   Drug use: No   Sexual activity: Not on file  Other Topics Concern   Not on file  Social History Narrative   Caffeine use 2 servings per day.   Social Determinants of Health   Financial Resource Strain: Not on file  Food Insecurity: Not on file  Transportation Needs: Not on file  Physical Activity: Not on file  Stress: Not on file  Social Connections: Not on file  Intimate Partner Violence: Not on file     FAMILY HISTORY:  Family History  Problem Relation Age of Onset   Esophagitis Mother    Arthritis/Rheumatoid Father    Kidney disease Neg Hx    Prostate cancer Neg Hx    Kidney cancer Neg Hx    Bladder Cancer Neg Hx       REVIEW OF SYSTEMS:  Review of Systems  Constitutional:  Negative for chills and fever.  HENT:  Negative for congestion and sore throat.   Respiratory:  Negative for cough and shortness of breath.   Cardiovascular:  Negative for chest pain and palpitations.   Gastrointestinal:  Positive for abdominal pain, nausea and vomiting.  All other systems reviewed and are negative.  VITAL SIGNS:  Temp:  [97.6 F (36.4 C)-99.3 F (37.4 C)] 98.6 F (37 C) (12/13 0808) Pulse Rate:  [80-101] 93 (12/13 0808) Resp:  [13-20] 16 (12/13 0808) BP: (150-167)/(84-108) 150/88 (12/13 0808) SpO2:  [95 %-99 %] 95 % (12/13 0808) Weight:  [95 kg-96.2 kg] 95 kg (12/13 0111)     Height: 6' (182.9 cm) Weight: 95 kg BMI (Calculated): 28.41   INTAKE/OUTPUT:  12/12 0701 - 12/13 0700 In: 140.8 [I.V.:140.8] Out: -   PHYSICAL EXAM:  Physical Exam Vitals and nursing note reviewed.  Constitutional:      General: He is not in acute distress.    Appearance:  He is well-developed. He is not ill-appearing.  HENT:     Head: Normocephalic and atraumatic.     Mouth/Throat:     Pharynx: Oropharynx is clear.     Comments: NGT in place; output somewhat feculent appearing Eyes:     General: No scleral icterus.    Extraocular Movements: Extraocular movements intact.  Cardiovascular:     Rate and Rhythm: Normal rate and regular rhythm.     Heart sounds: Normal heart sounds. No murmur heard. Pulmonary:     Effort: Pulmonary effort is normal. No respiratory distress.     Breath sounds: Normal breath sounds.  Abdominal:     General: A surgical scar is present. There is distension.     Palpations: Abdomen is soft.     Tenderness: There is no abdominal tenderness. There is no guarding or rebound.     Comments: Abdomen is soft, no appreciable tenderness, he is still somewhat distended and tympanic, no rebound/guarding. Previous surgical scars seen   Genitourinary:    Comments: Deferred  Skin:    General: Skin is warm and dry.     Coloration: Skin is not jaundiced or pale.  Neurological:     General: No focal deficit present.     Mental Status: He is alert and oriented to person, place, and time.  Psychiatric:        Mood and Affect: Mood normal.        Behavior: Behavior  normal.     Labs:  CBC Latest Ref Rng & Units 07/23/2021 07/22/2021 08/04/2017  WBC 4.0 - 10.5 K/uL 11.4(H) 9.7 5.0  Hemoglobin 13.0 - 17.0 g/dL 41.9 37.9 02.4  Hematocrit 39.0 - 52.0 % 44.2 47.3 39.8(L)  Platelets 150 - 400 K/uL 246 249 188   CMP Latest Ref Rng & Units 07/23/2021 07/22/2021 05/14/2020  Glucose 70 - 99 mg/dL 097(D) 532(D) -  BUN 8 - 23 mg/dL 92(E) 26(S) -  Creatinine 0.61 - 1.24 mg/dL 3.41 9.62 2.29(N)  Sodium 135 - 145 mmol/L 138 137 -  Potassium 3.5 - 5.1 mmol/L 4.0 3.8 -  Chloride 98 - 111 mmol/L 106 104 -  CO2 22 - 32 mmol/L 24 24 -  Calcium 8.9 - 10.3 mg/dL 8.9 9.2 -  Total Protein 6.5 - 8.1 g/dL 7.2 7.9 -  Total Bilirubin 0.3 - 1.2 mg/dL 1.0 0.9 -  Alkaline Phos 38 - 126 U/L 63 66 -  AST 15 - 41 U/L 17 20 -  ALT 0 - 44 U/L 13 15 -     Imaging studies:   CT Abdomen/Pelvis (07/22/2021) personally reviewed central loops of dilated bowel, appears decompressed distally, he does have hiatal hernia, and radiologist report reviewed below:  IMPRESSION: Dilated central small bowel loops with decompressed distal small bowel compatible with small-bowel obstruction. This appears to be due to adhesions in the right lower abdomen/upper pelvis.   Aortic atherosclerosis.   Moderate-sized hiatal hernia.   Assessment/Plan: (ICD-10's: K39.609) 75 y.o. 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   - Appreciate medicine admission - Agree with NGT decompression; LIS; monitor and record output   - NPO + IVF resuscitation - Monitor abdominal examination; on-going bowel function - Consider serial KUBs; will order for tomorrow AM  - Pain control prn (minimize narcotics); antiemetics prn   - Mobilization encouraged  - No surgical intervention. He understands that if he were to fail conservative measures or clinically deteriorates, he would require intervention.   -  Further management per primary service; we will follow    All of the above findings and recommendations were discussed with the patient, and all of patient's questions were answered to his expressed satisfaction.  Thank you for the opportunity to participate in this patient's care.   -- Edison Simon, PA-C [facility] 07/23/2021, 9:47 AM [phone] M-F: 7am - 4pm

## 2021-07-23 NOTE — Progress Notes (Signed)
Mobility Specialist - Progress Note   07/23/21 1000  Mobility  Activity Ambulated in hall  Level of Assistance Independent  Assistive Device None  Distance Ambulated (ft) 360 ft  Mobility Ambulated independently in hallway  Mobility Response Tolerated well  Mobility performed by Mobility specialist  $Mobility charge 1 Mobility    During mobility: 112 HR, 94% SpO2   Pt lying in bed upon arrival, utilizing RA. Denied pain and nausea. Ambulated to bathroom for urinal output prior to continuation in hallway. No physical assist. Mild SOB with activity, O2 maintained mid 90s. Pt returned to supine with needs in reach.     Filiberto Pinks Mobility Specialist 07/23/21, 10:13 AM

## 2021-07-23 NOTE — Progress Notes (Signed)
PROGRESS NOTE    Keith Craig.  IPJ:[id number] DOB: [birth year] DOA: 07/22/2021 PCP: Marguarite Arbour, MD    Brief Narrative:  Keith Craig. is a 75 y.o. male seen in ed with complaints of abdominal pain, since midnight and multiple episode of vomiting.  Pt also had diarrhea. Pt has h/o SBO in past that resolved with lysis.  12/13 NGT in place with lots of biliary fluid . Surgery consulted    Consultants:  surgery  Procedures:   Antimicrobials:      Subjective: No n/v today. Abd still distended     Objective: Vitals:   07/23/21 0028 07/23/21 0111 07/23/21 0430 07/23/21 0808  BP: (!) 155/108 (!) 155/92 (!) 167/85 (!) 150/88  Pulse: 96 97 93 93  Resp: 16 20 20 16   Temp: 97.9 F (36.6 C) 98.4 F (36.9 C) 99.3 F (37.4 C) 98.6 F (37 C)  TempSrc: Oral Oral Oral   SpO2: 99% 98% 95% 95%  Weight:  95 kg    Height:  6' (1.829 m)      Intake/Output Summary (Last 24 hours) at 07/23/2021 0835 Last data filed at 07/23/2021 0313 Gross per 24 hour  Intake 140.76 ml  Output --  Net 140.76 ml   Filed Weights   07/22/21 1430 07/23/21 0111  Weight: 96.2 kg 95 kg    Examination:  General exam: Appears calm and comfortable  NGT in place with bilious fluid Respiratory system: Clear to auscultation. Respiratory effort normal. Cardiovascular system: S1 & S2 heard, RRR. No JVD, murmurs, rubs, gallops or clicks.  Gastrointestinal system: Abdomen is nondistended, soft and nontender.  Normal bowel sounds heard. Central nervous system: Alert and oriented. No focal neurological deficits. Extremities: No edema Psychiatry: Judgement and insight appear normal. Mood & affect appropriate.     Data Reviewed: I have personally reviewed following labs and imaging studies  CBC: Recent Labs  Lab 07/22/21 1432 07/23/21 0259  WBC 9.7 11.4*  HGB 15.6 15.2  HCT 47.3 44.2  MCV 88.9 87.0  PLT 249 246   Basic Metabolic Panel: Recent Labs  Lab 07/22/21 1432  07/23/21 0259  NA 137 138  K 3.8 4.0  CL 104 106  CO2 24 24  GLUCOSE 132* 113*  BUN 24* 29*  CREATININE 1.15 1.07  CALCIUM 9.2 8.9   GFR: Estimated Creatinine Clearance: 72.5 mL/min (by C-G formula based on SCr of 1.07 mg/dL). Liver Function Tests: Recent Labs  Lab 07/22/21 1432 07/23/21 0259  AST 20 17  ALT 15 13  ALKPHOS 66 63  BILITOT 0.9 1.0  PROT 7.9 7.2  ALBUMIN 4.1 3.7   Recent Labs  Lab 07/22/21 1432  LIPASE 26   No results for input(s): AMMONIA in the last 168 hours. Coagulation Profile: No results for input(s): INR, PROTIME in the last 168 hours. Cardiac Enzymes: No results for input(s): CKTOTAL, CKMB, CKMBINDEX, TROPONINI in the last 168 hours. BNP (last 3 results) No results for input(s): PROBNP in the last 8760 hours. HbA1C: No results for input(s): HGBA1C in the last 72 hours. CBG: No results for input(s): GLUCAP in the last 168 hours. Lipid Profile: No results for input(s): CHOL, HDL, LDLCALC, TRIG, CHOLHDL, LDLDIRECT in the last 72 hours. Thyroid Function Tests: No results for input(s): TSH, T4TOTAL, FREET4, T3FREE, THYROIDAB in the last 72 hours. Anemia Panel: No results for input(s): VITAMINB12, FOLATE, FERRITIN, TIBC, IRON, RETICCTPCT in the last 72 hours. Sepsis Labs: No results for input(s): PROCALCITON, LATICACIDVEN in the  last 168 hours.  Recent Results (from the past 240 hour(s))  Resp Panel by RT-PCR (Flu A&B, Covid) Nasopharyngeal Swab     Status: None   Collection Time: 07/22/21 10:09 PM   Specimen: Nasopharyngeal Swab; Nasopharyngeal(NP) swabs in vial transport medium  Result Value Ref Range Status   SARS Coronavirus 2 by RT PCR NEGATIVE NEGATIVE Final    Comment: (NOTE) SARS-CoV-2 target nucleic acids are NOT DETECTED.  The SARS-CoV-2 RNA is generally detectable in upper respiratory specimens during the acute phase of infection. The lowest concentration of SARS-CoV-2 viral copies this assay can detect is 138 copies/mL. A  negative result does not preclude SARS-Cov-2 infection and should not be used as the sole basis for treatment or other patient management decisions. A negative result may occur with  improper specimen collection/handling, submission of specimen other than nasopharyngeal swab, presence of viral mutation(s) within the areas targeted by this assay, and inadequate number of viral copies(<138 copies/mL). A negative result must be combined with clinical observations, patient history, and epidemiological information. The expected result is Negative.  Fact Sheet for Patients:  BloggerCourse.com  Fact Sheet for Healthcare Providers:  SeriousBroker.it  This test is no t yet approved or cleared by the [place] FDA and  has been authorized for detection and/or diagnosis of SARS-CoV-2 by FDA under an Emergency Use Authorization (EUA). This EUA will remain  in effect (meaning this test can be used) for the duration of the COVID-19 declaration under Section 564(b)(1) of the Act, 21 U.S.C.section 360bbb-3(b)(1), unless the authorization is terminated  or revoked sooner.       Influenza A by PCR NEGATIVE NEGATIVE Final   Influenza B by PCR NEGATIVE NEGATIVE Final    Comment: (NOTE) The Xpert Xpress SARS-CoV-2/FLU/RSV plus assay is intended as an aid in the diagnosis of influenza from Nasopharyngeal swab specimens and should not be used as a sole basis for treatment. Nasal washings and aspirates are unacceptable for Xpert Xpress SARS-CoV-2/FLU/RSV testing.  Fact Sheet for Patients: BloggerCourse.com  Fact Sheet for Healthcare Providers: SeriousBroker.it  This test is not yet approved or cleared by the [place] FDA and has been authorized for detection and/or diagnosis of SARS-CoV-2 by FDA under an Emergency Use Authorization (EUA). This EUA will remain in effect (meaning this test can  be used) for the duration of the COVID-19 declaration under Section 564(b)(1) of the Act, 21 U.S.C. section 360bbb-3(b)(1), unless the authorization is terminated or revoked.  Performed at [facility], [address] [place], [place] [address]          Radiology Studies: CT ABDOMEN PELVIS WO CONTRAST  Result Date: 07/22/2021 CLINICAL DATA:  Bowel obstruction suspected. Generalized abdominal pain. Nausea, vomiting. EXAM: CT ABDOMEN AND PELVIS WITHOUT CONTRAST TECHNIQUE: Multidetector CT imaging of the abdomen and pelvis was performed following the standard protocol without IV contrast. COMPARISON:  05/14/2020 FINDINGS: Lower chest: Moderate-sized hiatal hernia.  No acute abnormality Hepatobiliary: No focal liver abnormality is seen. Status post cholecystectomy. No biliary dilatation. Pancreas: No focal abnormality or ductal dilatation. Spleen: No focal abnormality.  Normal size. Adrenals/Urinary Tract: 6 cm cyst in the midpole of the left kidney, unchanged. No hydronephrosis or stones. Adrenal glands unremarkable. Small diverticulum off the left lateral wall of the bladder, otherwise unremarkable. Stomach/Bowel: Normal appendix. Few scattered colonic diverticula. No active diverticulitis. Dilated central abdominal small bowel loops. Distal small bowel is decompressed. Transition appears to be in the right lobe abdomen/upper pelvis in an area that appears tethered,  presumably adhesions. Stomach is decompressed. Vascular/Lymphatic: Aortic atherosclerosis. No evidence of aneurysm or adenopathy. Reproductive: Mildly prominent prostate with central calcifications. Other: No free fluid or free air. Bilateral inguinal hernias containing fat. Musculoskeletal: No acute bony abnormality. IMPRESSION: Dilated central small bowel loops with decompressed distal small bowel compatible with small-bowel obstruction. This appears to be due to adhesions in the right lower abdomen/upper pelvis. Aortic  atherosclerosis. Moderate-sized hiatal hernia. Electronically Signed   By: Charlett Nose M.D.   On: 07/22/2021 17:54   DG Abd Portable 1 View  Result Date: 07/22/2021 CLINICAL DATA:  NG tube placement EXAM: PORTABLE ABDOMEN - 1 VIEW COMPARISON:  07/29/2017 FINDINGS: NG tube tip is in the proximal stomach. Moderate-sized hiatal hernia. Mildly prominent small bowel loops. IMPRESSION: NG tube in the proximal stomach. Electronically Signed   By: Charlett Nose M.D.   On: 07/22/2021 23:15        Scheduled Meds:  cloNIDine  0.2 mg Transdermal Weekly   heparin  5,000 Units Subcutaneous Q8H   hydrALAZINE  10 mg Intravenous Q6H   Continuous Infusions:  sodium chloride 75 mL/hr at 07/23/21 0738   promethazine (PHENERGAN) injection (IM or IVPB)      Assessment & Plan:   Principal Problem:   SBO (small bowel obstruction) (HCC) Active Problems:   COPD (chronic obstructive pulmonary disease) (HCC)   HTN (hypertension)   Esophageal reflux   Nausea vomiting and diarrhea   Small bowel obstruction NGT in place N.p.o., continue IV fluids Surgery's input appreciated.  Consider serial KUB, will order for tomorrow a.m. Mobilization encouraged No surgical intervention at this time.  If he fails conservative management and clinically deteriorates will require intervention.     Esophageal reflux: Continue PPI-on hold   COPD: Not in acute exacerbation Continue inhalers  Hypertension: Continue IV hydralazine  DVT prophylaxis: Heparin Code Status: Full Family Communication: None at bedside Disposition Plan:  Status is: Inpatient  Remains inpatient appropriate because: Needing IV treatment            LOS: 1 day   Time spent: 35 minutes with more than 50% on COC    Lynn Ito, MD [facility] Pager [phone]  If 7PM-7AM, please contact night-coverage 07/23/2021, 8:35 AM

## 2021-07-24 ENCOUNTER — Inpatient Hospital Stay: Payer: Medicare Other

## 2021-07-24 DIAGNOSIS — I10 Essential (primary) hypertension: Secondary | ICD-10-CM

## 2021-07-24 LAB — BASIC METABOLIC PANEL
Anion gap: 6 (ref 5–15)
BUN: 27 mg/dL — ABNORMAL HIGH (ref 8–23)
CO2: 23 mmol/L (ref 22–32)
Calcium: 8.5 mg/dL — ABNORMAL LOW (ref 8.9–10.3)
Chloride: 108 mmol/L (ref 98–111)
Creatinine, Ser: 1.15 mg/dL (ref 0.61–1.24)
GFR, Estimated: >60 mL/min (ref >60)
Glucose, Bld: 97 mg/dL (ref 70–99)
Potassium: 3.6 mmol/L (ref 3.5–5.1)
Sodium: 137 mmol/L (ref 135–145)

## 2021-07-24 LAB — CBC
HCT: 44 % (ref 39.0–52.0)
Hemoglobin: 15 g/dL (ref 13.0–17.0)
MCH: 29.9 pg (ref 26.0–34.0)
MCHC: 34.1 g/dL (ref 30.0–36.0)
MCV: 87.8 fL (ref 80.0–100.0)
Platelets: 243 10*3/uL (ref 150–400)
RBC: 5.01 MIL/uL (ref 4.22–5.81)
RDW: 14.6 % (ref 11.5–15.5)
WBC: 10 10*3/uL (ref 4.0–10.5)
nRBC: 0 % (ref 0.0–0.2)

## 2021-07-24 MED ORDER — PANTOPRAZOLE SODIUM 40 MG IV SOLR
40.0000 mg | INTRAVENOUS | Status: DC
  Administered 2021-07-24 – 2021-07-27 (×4): 40 mg via INTRAVENOUS
  Filled 2021-07-24 (×4): qty 40

## 2021-07-24 NOTE — Progress Notes (Signed)
[facility] SURGICAL PROGRESS NOTE (cpt [phone])  Hospital Day(s): 2.   Interval History: Patient seen and examined, no acute events or new complaints overnight. Patient reports his abdomen continues to improve; no abdominal pain. No fever, chills, nausea, emesis. No new labs this morning. He has had 500 ccs recorded from NGT in last 24 hours. He did have a KUB this morning which showed a loop of dilated small bowel centrally, gas in colon. He denied any flatus but reports feeling rumbling.   Review of Systems:  Constitutional: denies fever, chills  HEENT: denies cough or congestion  Respiratory: denies any shortness of breath  Cardiovascular: denies chest pain or palpitations  Gastrointestinal: denies abdominal pain, N/V Genitourinary: denies burning with urination or urinary frequency  Vital signs in last 24 hours: [min-max] current  Temp:  [98.6 F (37 C)-98.8 F (37.1 C)] 98.6 F (37 C) (12/14 0511) Pulse Rate:  [91-105] 96 (12/14 0511) Resp:  [16-18] 16 (12/14 0511) BP: (140-168)/(88-101) 153/88 (12/14 0511) SpO2:  [94 %-97 %] 94 % (12/14 0511)     Height: 6' (182.9 cm) Weight: 95 kg BMI (Calculated): 28.41   Intake/Output last 2 shifts:  12/13 0701 - 12/14 0700 In: 958.1 [I.V.:958.1] Out: 950 [Urine:450; Emesis/NG output:500]   Physical Exam:  Constitutional: alert, cooperative and no distress  HENT: normocephalic without obvious abnormality, NGT in place Eyes: PERRL, EOM's grossly intact and symmetric  Respiratory: breathing non-labored at rest  Cardiovascular: regular rate and sinus rhythm  Gastrointestinal: soft, non-tender, and non-distended, no rebound/guarding Musculoskeletal: no edema or wounds, motor and sensation grossly intact, NT    Labs:  CBC Latest Ref Rng & Units 07/23/2021 07/22/2021 08/04/2017  WBC 4.0 - 10.5 K/uL 11.4(H) 9.7 5.0  Hemoglobin 13.0 - 17.0 g/dL 15.2 15.6 13.7  Hematocrit 39.0 - 52.0 % 44.2 47.3 39.8(L)  Platelets 150 -  400 K/uL 246 249 188   CMP Latest Ref Rng & Units 07/23/2021 07/22/2021 05/14/2020  Glucose 70 - 99 mg/dL 113(H) 132(H) -  BUN 8 - 23 mg/dL 29(H) 24(H) -  Creatinine 0.61 - 1.24 mg/dL 1.07 1.15 1.80(H)  Sodium 135 - 145 mmol/L 138 137 -  Potassium 3.5 - 5.1 mmol/L 4.0 3.8 -  Chloride 98 - 111 mmol/L 106 104 -  CO2 22 - 32 mmol/L 24 24 -  Calcium 8.9 - 10.3 mg/dL 8.9 9.2 -  Total Protein 6.5 - 8.1 g/dL 7.2 7.9 -  Total Bilirubin 0.3 - 1.2 mg/dL 1.0 0.9 -  Alkaline Phos 38 - 126 U/L 63 66 -  AST 15 - 41 U/L 17 20 -  ALT 0 - 44 U/L 13 15 -     Imaging studies:   KUB (07/24/2021) personally reviewed which shows a dilated loop of small bowel centrally but there is gas throughout the colon, and radiologist report pending   Assessment/Plan: (ICD-10's: K44.609) 75 y.o. 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  - Continue NGT decompression today; LIS; monitor and record output              - NPO + IVF resuscitation - Monitor abdominal examination; on-going bowel function - Consider serial KUBs; will order for AM - Add PPI             - Pain control prn (minimize narcotics); antiemetics prn              - Mobilization encouraged             -  No surgical intervention. He understands that if he were to fail conservative measures or clinically deteriorates, he would require intervention.              - Further management per primary service; we will follow    All of the above findings and recommendations were discussed with the patient, and all of patient's questions were answered to his expressed satisfaction.  -- Lynden Oxford, PA-C [facility] 07/24/2021, 7:22 AM [phone] M-F: 7am - 4pm

## 2021-07-24 NOTE — Progress Notes (Addendum)
PROGRESS NOTE    Keith Craig.  OYD:[id number] DOB: 09/23/1945 DOA: 07/22/2021 PCP: Marguarite Arbour, MD   Assessment & Plan:   Principal Problem:   SBO (small bowel obstruction) (HCC) Active Problems:   COPD (chronic obstructive pulmonary disease) (HCC)   HTN (hypertension)   Esophageal reflux   Nausea vomiting and diarrhea  SBO: NPO. Continue w/ NG tube w/ low intermittent suction as per general surg . Continue on IVFs. Monitor I/Os. Mobilization encouraged   GERD: continue on IV PPI    COPD: w/o exacerbation. Continue on bronchodilators   HTN: continue on IV hydralazine   DVT prophylaxis:  heparin  Code Status: full  Family Communication:  Disposition Plan: likely d/c back home   Level of care: Telemetry Medical   Status is: Inpatient  Remains inpatient appropriate because: still requiring NG tube       Consultants:  General surg   Procedures:   Antimicrobials:   Subjective: Pt c/o fatigue   Objective: Vitals:   07/23/21 0808 07/23/21 1511 07/23/21 1944 07/24/21 0511  BP: (!) 150/88 (!) 168/101 (!) 140/93 (!) 153/88  Pulse: 93 91 (!) 105 96  Resp: 16 16 18 16   Temp: 98.6 F (37 C) 98.8 F (37.1 C) 98.6 F (37 C) 98.6 F (37 C)  TempSrc:   Oral Oral  SpO2: 95% 97% 97% 94%  Weight:      Height:        Intake/Output Summary (Last 24 hours) at 07/24/2021 0840 Last data filed at 07/24/2021 0519 Gross per 24 hour  Intake 958.11 ml  Output 950 ml  Net 8.11 ml   Filed Weights   07/22/21 1430 07/23/21 0111  Weight: 96.2 kg 95 kg    Examination:  General exam: Appears calm and comfortable  Respiratory system: Clear to auscultation. Respiratory effort normal. Cardiovascular system: S1 & S2 +. No J rubs, gallops or clicks. No pedal edema. Gastrointestinal system: Abdomen is distended, soft and nontender. Severely hypoactive bowel sounds  Central nervous system: Alert and oriented. Moves all extremities  Psychiatry: Judgement and  insight appear normal. Flat mood and affect     Data Reviewed: I have personally reviewed following labs and imaging studies  CBC: Recent Labs  Lab 07/22/21 1432 07/23/21 0259  WBC 9.7 11.4*  HGB 15.6 15.2  HCT 47.3 44.2  MCV 88.9 87.0  PLT 249 246   Basic Metabolic Panel: Recent Labs  Lab 07/22/21 1432 07/23/21 0259  NA 137 138  K 3.8 4.0  CL 104 106  CO2 24 24  GLUCOSE 132* 113*  BUN 24* 29*  CREATININE 1.15 1.07  CALCIUM 9.2 8.9   GFR: Estimated Creatinine Clearance: 72.5 mL/min (by C-G formula based on SCr of 1.07 mg/dL). Liver Function Tests: Recent Labs  Lab 07/22/21 1432 07/23/21 0259  AST 20 17  ALT 15 13  ALKPHOS 66 63  BILITOT 0.9 1.0  PROT 7.9 7.2  ALBUMIN 4.1 3.7   Recent Labs  Lab 07/22/21 1432  LIPASE 26   No results for input(s): AMMONIA in the last 168 hours. Coagulation Profile: No results for input(s): INR, PROTIME in the last 168 hours. Cardiac Enzymes: No results for input(s): CKTOTAL, CKMB, CKMBINDEX, TROPONINI in the last 168 hours. BNP (last 3 results) No results for input(s): PROBNP in the last 8760 hours. HbA1C: No results for input(s): HGBA1C in the last 72 hours. CBG: No results for input(s): GLUCAP in the last 168 hours. Lipid Profile: No results for  input(s): CHOL, HDL, LDLCALC, TRIG, CHOLHDL, LDLDIRECT in the last 72 hours. Thyroid Function Tests: No results for input(s): TSH, T4TOTAL, FREET4, T3FREE, THYROIDAB in the last 72 hours. Anemia Panel: No results for input(s): VITAMINB12, FOLATE, FERRITIN, TIBC, IRON, RETICCTPCT in the last 72 hours. Sepsis Labs: No results for input(s): PROCALCITON, LATICACIDVEN in the last 168 hours.  Recent Results (from the past 240 hour(s))  Resp Panel by RT-PCR (Flu A&B, Covid) Nasopharyngeal Swab     Status: None   Collection Time: 07/22/21 10:09 PM   Specimen: Nasopharyngeal Swab; Nasopharyngeal(NP) swabs in vial transport medium  Result Value Ref Range Status   SARS  Coronavirus 2 by RT PCR NEGATIVE NEGATIVE Final    Comment: (NOTE) SARS-CoV-2 target nucleic acids are NOT DETECTED.  The SARS-CoV-2 RNA is generally detectable in upper respiratory specimens during the acute phase of infection. The lowest concentration of SARS-CoV-2 viral copies this assay can detect is 138 copies/mL. A negative result does not preclude SARS-Cov-2 infection and should not be used as the sole basis for treatment or other patient management decisions. A negative result may occur with  improper specimen collection/handling, submission of specimen other than nasopharyngeal swab, presence of viral mutation(s) within the areas targeted by this assay, and inadequate number of viral copies(<138 copies/mL). A negative result must be combined with clinical observations, patient history, and epidemiological information. The expected result is Negative.  Fact Sheet for Patients:  BloggerCourse.com  Fact Sheet for Healthcare Providers:  SeriousBroker.it  This test is no t yet approved or cleared by the [place] FDA and  has been authorized for detection and/or diagnosis of SARS-CoV-2 by FDA under an Emergency Use Authorization (EUA). This EUA will remain  in effect (meaning this test can be used) for the duration of the COVID-19 declaration under Section 564(b)(1) of the Act, 21 U.S.C.section 360bbb-3(b)(1), unless the authorization is terminated  or revoked sooner.       Influenza A by PCR NEGATIVE NEGATIVE Final   Influenza B by PCR NEGATIVE NEGATIVE Final    Comment: (NOTE) The Xpert Xpress SARS-CoV-2/FLU/RSV plus assay is intended as an aid in the diagnosis of influenza from Nasopharyngeal swab specimens and should not be used as a sole basis for treatment. Nasal washings and aspirates are unacceptable for Xpert Xpress SARS-CoV-2/FLU/RSV testing.  Fact Sheet for  Patients: BloggerCourse.com  Fact Sheet for Healthcare Providers: SeriousBroker.it  This test is not yet approved or cleared by the [place] FDA and has been authorized for detection and/or diagnosis of SARS-CoV-2 by FDA under an Emergency Use Authorization (EUA). This EUA will remain in effect (meaning this test can be used) for the duration of the COVID-19 declaration under Section 564(b)(1) of the Act, 21 U.S.C. section 360bbb-3(b)(1), unless the authorization is terminated or revoked.  Performed at [facility], [address], [place], [place] [address]          Radiology Studies: CT ABDOMEN PELVIS WO CONTRAST  Result Date: 07/22/2021 CLINICAL DATA:  Bowel obstruction suspected. Generalized abdominal pain. Nausea, vomiting. EXAM: CT ABDOMEN AND PELVIS WITHOUT CONTRAST TECHNIQUE: Multidetector CT imaging of the abdomen and pelvis was performed following the standard protocol without IV contrast. COMPARISON:  05/14/2020 FINDINGS: Lower chest: Moderate-sized hiatal hernia.  No acute abnormality Hepatobiliary: No focal liver abnormality is seen. Status post cholecystectomy. No biliary dilatation. Pancreas: No focal abnormality or ductal dilatation. Spleen: No focal abnormality.  Normal size. Adrenals/Urinary Tract: 6 cm cyst in the midpole of the left kidney, unchanged.  No hydronephrosis or stones. Adrenal glands unremarkable. Small diverticulum off the left lateral wall of the bladder, otherwise unremarkable. Stomach/Bowel: Normal appendix. Few scattered colonic diverticula. No active diverticulitis. Dilated central abdominal small bowel loops. Distal small bowel is decompressed. Transition appears to be in the right lobe abdomen/upper pelvis in an area that appears tethered, presumably adhesions. Stomach is decompressed. Vascular/Lymphatic: Aortic atherosclerosis. No evidence of aneurysm or adenopathy. Reproductive: Mildly  prominent prostate with central calcifications. Other: No free fluid or free air. Bilateral inguinal hernias containing fat. Musculoskeletal: No acute bony abnormality. IMPRESSION: Dilated central small bowel loops with decompressed distal small bowel compatible with small-bowel obstruction. This appears to be due to adhesions in the right lower abdomen/upper pelvis. Aortic atherosclerosis. Moderate-sized hiatal hernia. Electronically Signed   By: Charlett Nose M.D.   On: 07/22/2021 17:54   DG Abd Portable 1 View  Result Date: 07/22/2021 CLINICAL DATA:  NG tube placement EXAM: PORTABLE ABDOMEN - 1 VIEW COMPARISON:  07/29/2017 FINDINGS: NG tube tip is in the proximal stomach. Moderate-sized hiatal hernia. Mildly prominent small bowel loops. IMPRESSION: NG tube in the proximal stomach. Electronically Signed   By: Charlett Nose M.D.   On: 07/22/2021 23:15        Scheduled Meds:  cloNIDine  0.2 mg Transdermal Weekly   heparin  5,000 Units Subcutaneous Q8H   hydrALAZINE  10 mg Intravenous Q6H   pantoprazole (PROTONIX) IV  40 mg Intravenous Q24H   Continuous Infusions:  sodium chloride 75 mL/hr at 07/24/21 0743   promethazine (PHENERGAN) injection (IM or IVPB)       LOS: 2 days    Time spent: 21 mins     Charise Killian, MD [facility] Pager [phone]  If 7PM-7AM, please contact night-coverage 07/24/2021, 8:40 AM

## 2021-07-25 ENCOUNTER — Inpatient Hospital Stay: Payer: Medicare Other

## 2021-07-25 DIAGNOSIS — E876 Hypokalemia: Secondary | ICD-10-CM

## 2021-07-25 LAB — BASIC METABOLIC PANEL
Anion gap: 9 (ref 5–15)
BUN: 30 mg/dL — ABNORMAL HIGH (ref 8–23)
CO2: 21 mmol/L — ABNORMAL LOW (ref 22–32)
Calcium: 8.5 mg/dL — ABNORMAL LOW (ref 8.9–10.3)
Chloride: 110 mmol/L (ref 98–111)
Creatinine, Ser: 0.9 mg/dL (ref 0.61–1.24)
GFR, Estimated: >60 mL/min (ref >60)
Glucose, Bld: 89 mg/dL (ref 70–99)
Potassium: 3.3 mmol/L — ABNORMAL LOW (ref 3.5–5.1)
Sodium: 140 mmol/L (ref 135–145)

## 2021-07-25 LAB — LYME DISEASE, WESTERN BLOT
IgG P18 Ab.: ABSENT
IgG P23 Ab.: ABSENT
IgG P28 Ab.: ABSENT
IgG P30 Ab.: ABSENT
IgG P39 Ab.: ABSENT
IgG P41 Ab.: ABSENT
IgG P45 Ab.: ABSENT
IgG P58 Ab.: ABSENT
IgG P66 Ab.: ABSENT
IgG P93 Ab.: ABSENT
IgM P23 Ab.: ABSENT
IgM P39 Ab.: ABSENT
IgM P41 Ab.: ABSENT
Lyme IgG Wb: NEGATIVE
Lyme IgM Wb: NEGATIVE

## 2021-07-25 LAB — CBC
HCT: 42.2 % (ref 39.0–52.0)
Hemoglobin: 14 g/dL (ref 13.0–17.0)
MCH: 29 pg (ref 26.0–34.0)
MCHC: 33.2 g/dL (ref 30.0–36.0)
MCV: 87.6 fL (ref 80.0–100.0)
Platelets: 244 10*3/uL (ref 150–400)
RBC: 4.82 MIL/uL (ref 4.22–5.81)
RDW: 14.6 % (ref 11.5–15.5)
WBC: 9 10*3/uL (ref 4.0–10.5)
nRBC: 0 % (ref 0.0–0.2)

## 2021-07-25 MED ORDER — SODIUM CHLORIDE 0.9 % IV SOLN: INTRAVENOUS | Status: DC

## 2021-07-25 MED ORDER — DIATRIZOATE MEGLUMINE & SODIUM 66-10 % PO SOLN
90.0000 mL | Freq: Once | ORAL | Status: AC
  Administered 2021-07-25: 90 mL via NASOGASTRIC

## 2021-07-25 MED ORDER — POTASSIUM CHLORIDE 10 MEQ/100ML IV SOLN
10.0000 meq | INTRAVENOUS | Status: AC
  Administered 2021-07-25 (×3): 10 meq via INTRAVENOUS
  Filled 2021-07-25 (×3): qty 100

## 2021-07-25 NOTE — Progress Notes (Signed)
[facility] SURGICAL PROGRESS NOTE (cpt [phone])  Hospital Day(s): 3.   Interval History: Patient seen and examined, no acute events or new complaints overnight. Patient reports he still feels somewhat distended but pain is still improved compared to admission. No fever, chills, nausea, emesis. He remains without leukocytosis; WBC 9.0. Renal function remains normal; sCr - 0.90; UO - 500 ccs + unmeasured. Mild hypokalemia to 3.3. He has had 1.1L ccs recorded from NGT in last 24 hours. He did have a KUB this morning which is unchanged. He denied any flatus but reports feeling rumbling.   Review of Systems:  Constitutional: denies fever, chills  HEENT: denies cough or congestion  Respiratory: denies any shortness of breath  Cardiovascular: denies chest pain or palpitations  Gastrointestinal: denies abdominal pain, N/V, + distension  Genitourinary: denies burning with urination or urinary frequency  Vital signs in last 24 hours: [min-max] current  Temp:  [97 F (36.1 C)-99.2 F (37.3 C)] 98.6 F (37 C) (12/15 0723) Pulse Rate:  [95-97] 97 (12/15 0723) Resp:  [16-18] 18 (12/15 0723) BP: (155-163)/(89-98) 155/98 (12/15 0723) SpO2:  [94 %-97 %] 94 % (12/15 0723)     Height: 6' (182.9 cm) Weight: 95 kg BMI (Calculated): 28.41   Intake/Output last 2 shifts:  12/14 0701 - 12/15 0700 In: 1792.1 [I.V.:1792.1] Out: 1650 [Urine:500; Emesis/NG output:1150]   Physical Exam:  Constitutional: alert, cooperative and no distress  HENT: normocephalic without obvious abnormality, NGT in place Eyes: PERRL, EOM's grossly intact and symmetric  Respiratory: breathing non-labored at rest  Cardiovascular: regular rate and sinus rhythm  Gastrointestinal: soft, non-tender, he is somewhat distended, tympanic centrally, no rebound/guarding Musculoskeletal: no edema or wounds, motor and sensation grossly intact, NT    Labs:  CBC Latest Ref Rng & Units 07/25/2021 07/24/2021 07/23/2021  WBC 4.0  - 10.5 K/uL 9.0 10.0 11.4(H)  Hemoglobin 13.0 - 17.0 g/dL 29.9 24.2 68.3  Hematocrit 39.0 - 52.0 % 42.2 44.0 44.2  Platelets 150 - 400 K/uL 244 243 246   CMP Latest Ref Rng & Units 07/25/2021 07/24/2021 07/23/2021  Glucose 70 - 99 mg/dL 89 97 419(Q)  BUN 8 - 23 mg/dL 22(W) 97(L) 89(Q)  Creatinine 0.61 - 1.24 mg/dL 1.19 4.17 4.08  Sodium 135 - 145 mmol/L 140 137 138  Potassium 3.5 - 5.1 mmol/L 3.3(L) 3.6 4.0  Chloride 98 - 111 mmol/L 110 108 106  CO2 22 - 32 mmol/L 21(L) 23 24  Calcium 8.9 - 10.3 mg/dL 1.4(G) 8.1(E) 8.9  Total Protein 6.5 - 8.1 g/dL - - 7.2  Total Bilirubin 0.3 - 1.2 mg/dL - - 1.0  Alkaline Phos 38 - 126 U/L - - 63  AST 15 - 41 U/L - - 17  ALT 0 - 44 U/L - - 13     Imaging studies:   KUB (07/25/2021) personally reviewed which shows a dilated loop of small bowel centrally but there is gas throughout the proximal colon, no significant change from prior, and radiologist report pending   Assessment/Plan: (ICD-10's: K52.609) 75 y.o. 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   - Will reassess with gastrografin challenge: KUB at 8 hours and 24 hours  - Continue NGT decompression today; LIS; monitor and record output              - NPO + IVF resuscitation - Monitor abdominal examination; on-going bowel function             - Pain  control prn (minimize narcotics); antiemetics prn              - Mobilization encouraged             - No surgical intervention. He understands that if he were to fail conservative measures or clinically deteriorates, he would require intervention.              - Further management per primary service; we will follow    All of the above findings and recommendations were discussed with the patient, and all of patient's questions were answered to his expressed satisfaction.  -- Edison Simon, PA-C [facility] 07/25/2021, 7:34 AM [phone] M-F: 7am - 4pm

## 2021-07-25 NOTE — Progress Notes (Signed)
PROGRESS NOTE    Keith Craig.  TIR:[id number] DOB: 03/11/1946 DOA: 07/22/2021 PCP: Marguarite Arbour, MD   Assessment & Plan:   Principal Problem:   SBO (small bowel obstruction) (HCC) Active Problems:   COPD (chronic obstructive pulmonary disease) (HCC)   HTN (hypertension)   Esophageal reflux   Nausea vomiting and diarrhea  SBO: NPO. Continue w/ NG tube w/ low intermittent suction as per general surg . Will go for gastrografin study today.Continue on IVFs. Monitor I/Os. Mobilization encouraged  Hypokalemia: potassium given    GERD: continue on IV PPI    COPD: w/o exacerbation. Continue on bronchodilators    HTN: continue on IV hydralazine   DVT prophylaxis:  heparin  Code Status: full  Family Communication:  Disposition Plan: likely d/c back home   Level of care: Telemetry Medical   Status is: Inpatient  Remains inpatient appropriate because: still requiring NG tube       Consultants:  General surg   Procedures:   Antimicrobials:   Subjective: Pt c/o malaise   Objective: Vitals:   07/24/21 1618 07/24/21 1905 07/25/21 0455 07/25/21 0723  BP: (!) 161/93 (!) 161/95 (!) 163/92 (!) 155/98  Pulse: 97 95 95 97  Resp: 18 18 16 18   Temp: 99.2 F (37.3 C) 98 F (36.7 C) (!) 97 F (36.1 C) 98.6 F (37 C)  TempSrc: Oral Oral  Oral  SpO2: 95% 95% 96% 94%  Weight:      Height:        Intake/Output Summary (Last 24 hours) at 07/25/2021 0755 Last data filed at 07/25/2021 0454 Gross per 24 hour  Intake 1792.12 ml  Output 1650 ml  Net 142.12 ml   Filed Weights   07/22/21 1430 07/23/21 0111  Weight: 96.2 kg 95 kg    Examination:  General exam: appears comfortable   Respiratory system: clear breath sounds b/l  Cardiovascular system: S1/S2+. No rubs or clicks  Gastrointestinal system: Abd is soft, NT, distended & hypoactive bowel sounds  Central nervous system: Alert and oriented. Moves all extremities Psychiatry: Judgement and insight  appear normal. Flat mood and affect     Data Reviewed: I have personally reviewed following labs and imaging studies  CBC: Recent Labs  Lab 07/22/21 1432 07/23/21 0259 07/24/21 0854 07/25/21 0124  WBC 9.7 11.4* 10.0 9.0  HGB 15.6 15.2 15.0 14.0  HCT 47.3 44.2 44.0 42.2  MCV 88.9 87.0 87.8 87.6  PLT 249 246 243 244   Basic Metabolic Panel: Recent Labs  Lab 07/22/21 1432 07/23/21 0259 07/24/21 0854 07/25/21 0124  NA 137 138 137 140  K 3.8 4.0 3.6 3.3*  CL 104 106 108 110  CO2 24 24 23  21*  GLUCOSE 132* 113* 97 89  BUN 24* 29* 27* 30*  CREATININE 1.15 1.07 1.15 0.90  CALCIUM 9.2 8.9 8.5* 8.5*   GFR: Estimated Creatinine Clearance: 86.2 mL/min (by C-G formula based on SCr of 0.9 mg/dL). Liver Function Tests: Recent Labs  Lab 07/22/21 1432 07/23/21 0259  AST 20 17  ALT 15 13  ALKPHOS 66 63  BILITOT 0.9 1.0  PROT 7.9 7.2  ALBUMIN 4.1 3.7   Recent Labs  Lab 07/22/21 1432  LIPASE 26   No results for input(s): AMMONIA in the last 168 hours. Coagulation Profile: No results for input(s): INR, PROTIME in the last 168 hours. Cardiac Enzymes: No results for input(s): CKTOTAL, CKMB, CKMBINDEX, TROPONINI in the last 168 hours. BNP (last 3 results) No results for  input(s): PROBNP in the last 8760 hours. HbA1C: No results for input(s): HGBA1C in the last 72 hours. CBG: No results for input(s): GLUCAP in the last 168 hours. Lipid Profile: No results for input(s): CHOL, HDL, LDLCALC, TRIG, CHOLHDL, LDLDIRECT in the last 72 hours. Thyroid Function Tests: No results for input(s): TSH, T4TOTAL, FREET4, T3FREE, THYROIDAB in the last 72 hours. Anemia Panel: No results for input(s): VITAMINB12, FOLATE, FERRITIN, TIBC, IRON, RETICCTPCT in the last 72 hours. Sepsis Labs: No results for input(s): PROCALCITON, LATICACIDVEN in the last 168 hours.  Recent Results (from the past 240 hour(s))  Resp Panel by RT-PCR (Flu A&B, Covid) Nasopharyngeal Swab     Status: None    Collection Time: 07/22/21 10:09 PM   Specimen: Nasopharyngeal Swab; Nasopharyngeal(NP) swabs in vial transport medium  Result Value Ref Range Status   SARS Coronavirus 2 by RT PCR NEGATIVE NEGATIVE Final    Comment: (NOTE) SARS-CoV-2 target nucleic acids are NOT DETECTED.  The SARS-CoV-2 RNA is generally detectable in upper respiratory specimens during the acute phase of infection. The lowest concentration of SARS-CoV-2 viral copies this assay can detect is 138 copies/mL. A negative result does not preclude SARS-Cov-2 infection and should not be used as the sole basis for treatment or other patient management decisions. A negative result may occur with  improper specimen collection/handling, submission of specimen other than nasopharyngeal swab, presence of viral mutation(s) within the areas targeted by this assay, and inadequate number of viral copies(<138 copies/mL). A negative result must be combined with clinical observations, patient history, and epidemiological information. The expected result is Negative.  Fact Sheet for Patients:  BloggerCourse.com  Fact Sheet for Healthcare Providers:  SeriousBroker.it  This test is no t yet approved or cleared by the [place] FDA and  has been authorized for detection and/or diagnosis of SARS-CoV-2 by FDA under an Emergency Use Authorization (EUA). This EUA will remain  in effect (meaning this test can be used) for the duration of the COVID-19 declaration under Section 564(b)(1) of the Act, 21 U.S.C.section 360bbb-3(b)(1), unless the authorization is terminated  or revoked sooner.       Influenza A by PCR NEGATIVE NEGATIVE Final   Influenza B by PCR NEGATIVE NEGATIVE Final    Comment: (NOTE) The Xpert Xpress SARS-CoV-2/FLU/RSV plus assay is intended as an aid in the diagnosis of influenza from Nasopharyngeal swab specimens and should not be used as a sole basis for treatment.  Nasal washings and aspirates are unacceptable for Xpert Xpress SARS-CoV-2/FLU/RSV testing.  Fact Sheet for Patients: BloggerCourse.com  Fact Sheet for Healthcare Providers: SeriousBroker.it  This test is not yet approved or cleared by the [place] FDA and has been authorized for detection and/or diagnosis of SARS-CoV-2 by FDA under an Emergency Use Authorization (EUA). This EUA will remain in effect (meaning this test can be used) for the duration of the COVID-19 declaration under Section 564(b)(1) of the Act, 21 U.S.C. section 360bbb-3(b)(1), unless the authorization is terminated or revoked.  Performed at [facility], [address], [place], [place] [address]          Radiology Studies: DG Abd 2 Views  Result Date: 07/24/2021 CLINICAL DATA:  Small-bowel obstruction, abdominal pain EXAM: ABDOMEN - 2 VIEW COMPARISON:  CT examination dated July 22, 2021 FINDINGS: There are dilated small bowel loops in the midabdomen measuring up to 4.7 cm concerning for bowel obstruction/ileus. The distal small bowel and colonic loops are normal in caliber. NG tube with distal tip in  the pyloric region is noted. Small hiatal hernia. There is no evidence of free air. No radio-opaque calculi or other significant radiographic abnormality is seen. Degenerative disease of the lumbar spine. IMPRESSION: Dilated small bowel loops in the mid abdomen concerning for obstruction. Electronically Signed   By: Larose Hires D.O.   On: 07/24/2021 09:49        Scheduled Meds:  cloNIDine  0.2 mg Transdermal Weekly   diatrizoate meglumine-sodium  90 mL Per NG tube Once   heparin  5,000 Units Subcutaneous Q8H   hydrALAZINE  10 mg Intravenous Q6H   pantoprazole (PROTONIX) IV  40 mg Intravenous Q24H   Continuous Infusions:  sodium chloride 75 mL/hr at 07/24/21 2221   promethazine (PHENERGAN) injection (IM or IVPB)       LOS: 3 days     Time spent: 25 mins     Charise Killian, MD [facility] Pager [phone]  If 7PM-7AM, please contact night-coverage 07/25/2021, 7:55 AM

## 2021-07-26 ENCOUNTER — Encounter: Payer: Self-pay | Admitting: Registered Nurse

## 2021-07-26 ENCOUNTER — Encounter
Admission: EM | Disposition: A | Discharge status: Home / Self Care | Payer: Self-pay | Source: Home / Self Care | Attending: Internal Medicine

## 2021-07-26 ENCOUNTER — Inpatient Hospital Stay: Payer: Medicare Other

## 2021-07-26 LAB — CBC
HCT: 41.3 % (ref 39.0–52.0)
Hemoglobin: 13.8 g/dL (ref 13.0–17.0)
MCH: 29.1 pg (ref 26.0–34.0)
MCHC: 33.4 g/dL (ref 30.0–36.0)
MCV: 86.9 fL (ref 80.0–100.0)
Platelets: 242 10*3/uL (ref 150–400)
RBC: 4.75 MIL/uL (ref 4.22–5.81)
RDW: 14.7 % (ref 11.5–15.5)
WBC: 7.7 10*3/uL (ref 4.0–10.5)
nRBC: 0 % (ref 0.0–0.2)

## 2021-07-26 LAB — BASIC METABOLIC PANEL
Anion gap: 8 (ref 5–15)
BUN: 37 mg/dL — ABNORMAL HIGH (ref 8–23)
CO2: 21 mmol/L — ABNORMAL LOW (ref 22–32)
Calcium: 8.5 mg/dL — ABNORMAL LOW (ref 8.9–10.3)
Chloride: 112 mmol/L — ABNORMAL HIGH (ref 98–111)
Creatinine, Ser: 0.85 mg/dL (ref 0.61–1.24)
GFR, Estimated: >60 mL/min (ref >60)
Glucose, Bld: 83 mg/dL (ref 70–99)
Potassium: 3.4 mmol/L — ABNORMAL LOW (ref 3.5–5.1)
Sodium: 141 mmol/L (ref 135–145)

## 2021-07-26 SURGERY — LAPAROTOMY, EXPLORATORY
Anesthesia: General

## 2021-07-26 MED ORDER — METOPROLOL TARTRATE 25 MG PO TABS
25.0000 mg | ORAL_TABLET | Freq: Every day | ORAL | Status: DC
  Administered 2021-07-27 – 2021-07-28 (×2): 25 mg via ORAL
  Filled 2021-07-26 (×2): qty 1

## 2021-07-26 MED ORDER — POTASSIUM CHLORIDE 10 MEQ/100ML IV SOLN
10.0000 meq | INTRAVENOUS | Status: AC
  Administered 2021-07-26 (×2): 10 meq via INTRAVENOUS
  Filled 2021-07-26 (×2): qty 100

## 2021-07-26 SURGICAL SUPPLY — 41 items
APL PRP STRL LF DISP 70% ISPRP (MISCELLANEOUS) ×1
BRR ADH 6X5 SEPRAFILM 1 SHT (MISCELLANEOUS)
CHLORAPREP W/TINT 26 (MISCELLANEOUS) ×4 IMPLANT
DRAPE LAPAROTOMY 100X77 ABD (DRAPES) ×4 IMPLANT
DRSG OPSITE POSTOP 4X12 (GAUZE/BANDAGES/DRESSINGS) IMPLANT
DRSG TEGADERM 4X10 (GAUZE/BANDAGES/DRESSINGS) IMPLANT
ELECT BLADE 6.5 EXT (BLADE) ×4 IMPLANT
ELECT CAUTERY BLADE TIP 2.5 (TIP) ×3
ELECT REM PT RETURN 9FT ADLT (ELECTROSURGICAL) ×3
ELECTRODE CAUTERY BLDE TIP 2.5 (TIP) ×2 IMPLANT
ELECTRODE REM PT RTRN 9FT ADLT (ELECTROSURGICAL) ×2 IMPLANT
GAUZE 4X4 16PLY ~~LOC~~+RFID DBL (SPONGE) ×4 IMPLANT
GAUZE SPONGE 4X4 12PLY STRL (GAUZE/BANDAGES/DRESSINGS) ×4 IMPLANT
GLOVE SURG SYN 7.0 (GLOVE) ×6 IMPLANT
GLOVE SURG SYN 7.0 PF PI (GLOVE) ×2 IMPLANT
GLOVE SURG SYN 7.5  E (GLOVE) ×4
GLOVE SURG SYN 7.5 E (GLOVE) ×2 IMPLANT
GLOVE SURG SYN 7.5 PF PI (GLOVE) ×2 IMPLANT
GOWN STRL REUS W/ TWL LRG LVL3 (GOWN DISPOSABLE) ×8 IMPLANT
GOWN STRL REUS W/TWL LRG LVL3 (GOWN DISPOSABLE) ×12
LABEL OR SOLS (LABEL) ×4 IMPLANT
LIGASURE IMPACT 36 18CM CVD LR (INSTRUMENTS) IMPLANT
MANIFOLD NEPTUNE II (INSTRUMENTS) ×4 IMPLANT
NEEDLE HYPO 22GX1.5 SAFETY (NEEDLE) ×4 IMPLANT
NS IRRIG 1000ML POUR BTL (IV SOLUTION) ×4 IMPLANT
PACK BASIN MAJOR ARMC (MISCELLANEOUS) ×4 IMPLANT
PACK COLON CLEAN CLOSURE (MISCELLANEOUS) ×4 IMPLANT
SEPRAFILM MEMBRANE 5X6 (MISCELLANEOUS) IMPLANT
SPONGE T-LAP 18X18 ~~LOC~~+RFID (SPONGE) ×12 IMPLANT
STAPLER SKIN PROX 35W (STAPLE) ×4 IMPLANT
SUT PDS AB 1 CT1 36 (SUTURE) ×4 IMPLANT
SUT PROLENE 2 0 SH DA (SUTURE) IMPLANT
SUT SILK 2 0 (SUTURE) ×3
SUT SILK 2-0 18XBRD TIE 12 (SUTURE) ×2 IMPLANT
SUT SILK 3-0 (SUTURE) ×4 IMPLANT
SUT VIC AB 3-0 SH 27 (SUTURE) ×3
SUT VIC AB 3-0 SH 27X BRD (SUTURE) ×2 IMPLANT
SYR 10ML LL (SYRINGE) ×4 IMPLANT
SYR 20ML LL LF (SYRINGE) IMPLANT
TRAY FOLEY MTR SLVR 16FR STAT (SET/KITS/TRAYS/PACK) ×4 IMPLANT
WATER STERILE IRR 500ML POUR (IV SOLUTION) ×4 IMPLANT

## 2021-07-26 NOTE — Progress Notes (Signed)
PROGRESS NOTE    Keith Craig.  IWL:[id number] DOB: Dec 26, 1945 DOA: 07/22/2021 PCP: Marguarite Arbour, MD   Assessment & Plan:   Principal Problem:   SBO (small bowel obstruction) (HCC) Active Problems:   COPD (chronic obstructive pulmonary disease) (HCC)   HTN (hypertension)   Esophageal reflux   Nausea vomiting and diarrhea   SBO:  NG tubed removed and started on clears as per general surg. Gastrograffin study showed resolving SBO. Gas today but no bowel movement yet. Mobilization encouraged   Hypokalemia: KCl repleated   GERD: continue on PPI    COPD: w/o exacerbation. Continue on bronchodilators   HTN: continue on metoprolol & hydralazine   DVT prophylaxis:  heparin  Code Status: full  Family Communication:  Disposition Plan: likely d/c back home   Level of care: Telemetry Medical   Status is: Inpatient  Remains inpatient appropriate because: NG tube removed but no bowel movement yet       Consultants:  General surg   Procedures:   Antimicrobials:   Subjective: Pt c/o fatigue   Objective: Vitals:   07/25/21 1722 07/25/21 2008 07/25/21 2346 07/26/21 0420  BP: (!) 162/90 (!) 168/99 (!) 155/85 (!) 171/89  Pulse:  93 76 76  Resp:  18  16  Temp:  98.2 F (36.8 C)  98.3 F (36.8 C)  TempSrc:  Oral  Oral  SpO2:  97%  96%  Weight:      Height:        Intake/Output Summary (Last 24 hours) at 07/26/2021 0754 Last data filed at 07/26/2021 0531 Gross per 24 hour  Intake 1188.15 ml  Output 675 ml  Net 513.15 ml   Filed Weights   07/22/21 1430 07/23/21 0111  Weight: 96.2 kg 95 kg    Examination:  General exam: Appears calm & comfortable   Respiratory system: clear breath sounds b/l  Cardiovascular system: S1 & S2+. No rubs or clicks  Gastrointestinal system: Abd is soft, NT, ND & hypoactive bowel sounds Central nervous system: Alert and oriented. Moves all extremities  Psychiatry: judgement and insight appears normal. Appropriate  mood and affect     Data Reviewed: I have personally reviewed following labs and imaging studies  CBC: Recent Labs  Lab 07/22/21 1432 07/23/21 0259 07/24/21 0854 07/25/21 0124 07/26/21 0427  WBC 9.7 11.4* 10.0 9.0 7.7  HGB 15.6 15.2 15.0 14.0 13.8  HCT 47.3 44.2 44.0 42.2 41.3  MCV 88.9 87.0 87.8 87.6 86.9  PLT 249 246 243 244 242   Basic Metabolic Panel: Recent Labs  Lab 07/22/21 1432 07/23/21 0259 07/24/21 0854 07/25/21 0124 07/26/21 0427  NA 137 138 137 140 141  K 3.8 4.0 3.6 3.3* 3.4*  CL 104 106 108 110 112*  CO2 24 24 23  21* 21*  GLUCOSE 132* 113* 97 89 83  BUN 24* 29* 27* 30* 37*  CREATININE 1.15 1.07 1.15 0.90 0.85  CALCIUM 9.2 8.9 8.5* 8.5* 8.5*   GFR: Estimated Creatinine Clearance: 91.2 mL/min (by C-G formula based on SCr of 0.85 mg/dL). Liver Function Tests: Recent Labs  Lab 07/22/21 1432 07/23/21 0259  AST 20 17  ALT 15 13  ALKPHOS 66 63  BILITOT 0.9 1.0  PROT 7.9 7.2  ALBUMIN 4.1 3.7   Recent Labs  Lab 07/22/21 1432  LIPASE 26   No results for input(s): AMMONIA in the last 168 hours. Coagulation Profile: No results for input(s): INR, PROTIME in the last 168 hours. Cardiac Enzymes: No results  for input(s): CKTOTAL, CKMB, CKMBINDEX, TROPONINI in the last 168 hours. BNP (last 3 results) No results for input(s): PROBNP in the last 8760 hours. HbA1C: No results for input(s): HGBA1C in the last 72 hours. CBG: No results for input(s): GLUCAP in the last 168 hours. Lipid Profile: No results for input(s): CHOL, HDL, LDLCALC, TRIG, CHOLHDL, LDLDIRECT in the last 72 hours. Thyroid Function Tests: No results for input(s): TSH, T4TOTAL, FREET4, T3FREE, THYROIDAB in the last 72 hours. Anemia Panel: No results for input(s): VITAMINB12, FOLATE, FERRITIN, TIBC, IRON, RETICCTPCT in the last 72 hours. Sepsis Labs: No results for input(s): PROCALCITON, LATICACIDVEN in the last 168 hours.  Recent Results (from the past 240 hour(s))  Resp Panel by  RT-PCR (Flu A&B, Covid) Nasopharyngeal Swab     Status: None   Collection Time: 07/22/21 10:09 PM   Specimen: Nasopharyngeal Swab; Nasopharyngeal(NP) swabs in vial transport medium  Result Value Ref Range Status   SARS Coronavirus 2 by RT PCR NEGATIVE NEGATIVE Final    Comment: (NOTE) SARS-CoV-2 target nucleic acids are NOT DETECTED.  The SARS-CoV-2 RNA is generally detectable in upper respiratory specimens during the acute phase of infection. The lowest concentration of SARS-CoV-2 viral copies this assay can detect is 138 copies/mL. A negative result does not preclude SARS-Cov-2 infection and should not be used as the sole basis for treatment or other patient management decisions. A negative result may occur with  improper specimen collection/handling, submission of specimen other than nasopharyngeal swab, presence of viral mutation(s) within the areas targeted by this assay, and inadequate number of viral copies(<138 copies/mL). A negative result must be combined with clinical observations, patient history, and epidemiological information. The expected result is Negative.  Fact Sheet for Patients:  BloggerCourse.com  Fact Sheet for Healthcare Providers:  SeriousBroker.it  This test is no t yet approved or cleared by the [place] FDA and  has been authorized for detection and/or diagnosis of SARS-CoV-2 by FDA under an Emergency Use Authorization (EUA). This EUA will remain  in effect (meaning this test can be used) for the duration of the COVID-19 declaration under Section 564(b)(1) of the Act, 21 U.S.C.section 360bbb-3(b)(1), unless the authorization is terminated  or revoked sooner.       Influenza A by PCR NEGATIVE NEGATIVE Final   Influenza B by PCR NEGATIVE NEGATIVE Final    Comment: (NOTE) The Xpert Xpress SARS-CoV-2/FLU/RSV plus assay is intended as an aid in the diagnosis of influenza from Nasopharyngeal swab  specimens and should not be used as a sole basis for treatment. Nasal washings and aspirates are unacceptable for Xpert Xpress SARS-CoV-2/FLU/RSV testing.  Fact Sheet for Patients: BloggerCourse.com  Fact Sheet for Healthcare Providers: SeriousBroker.it  This test is not yet approved or cleared by the [place] FDA and has been authorized for detection and/or diagnosis of SARS-CoV-2 by FDA under an Emergency Use Authorization (EUA). This EUA will remain in effect (meaning this test can be used) for the duration of the COVID-19 declaration under Section 564(b)(1) of the Act, 21 U.S.C. section 360bbb-3(b)(1), unless the authorization is terminated or revoked.  Performed at [facility], [address], [place], [place] [address]          Radiology Studies: DG Abd 2 Views  Result Date: 07/25/2021 CLINICAL DATA:  Follow-up small bowel obstruction. EXAM: ABDOMEN - 2 VIEW COMPARISON:  Radiographs from yesterday. FINDINGS: The NG tube is in good position, unchanged. Stable hiatal hernia. Scattered air throughout the colon. There may be a  few persistent air-filled loops of small but overall much improved bowel gas pattern consistent with resolving small bowel obstruction. No free air. IMPRESSION: Resolving small-bowel obstruction. Electronically Signed   By: Rudie Meyer M.D.   On: 07/25/2021 08:11   DG Abd Portable 1V-Small Bowel Obstruction Protocol-initial, 8 hr delay  Result Date: 07/25/2021 CLINICAL DATA:  Small bowel obstruction. EXAM: PORTABLE ABDOMEN - 1 VIEW COMPARISON:  Same day. FINDINGS: Residual contrast is noted throughout the nondilated colon. Distal tip of nasogastric tube is seen in expected position of proximal stomach. Status post cholecystectomy. No definite bowel dilatation is noted. IMPRESSION: Residual contrast is noted throughout nondilated colon. No definite small bowel dilatation is noted.  Electronically Signed   By: Lupita Raider M.D.   On: 07/25/2021 18:29        Scheduled Meds:  cloNIDine  0.2 mg Transdermal Weekly   heparin  5,000 Units Subcutaneous Q8H   hydrALAZINE  10 mg Intravenous Q6H   pantoprazole (PROTONIX) IV  40 mg Intravenous Q24H   Continuous Infusions:  sodium chloride 75 mL/hr at 07/26/21 0157   promethazine (PHENERGAN) injection (IM or IVPB)       LOS: 4 days    Time spent: 15 mins     Charise Killian, MD [facility] Pager [phone]  If 7PM-7AM, please contact night-coverage 07/26/2021, 7:54 AM

## 2021-07-26 NOTE — Progress Notes (Signed)
[facility] SURGICAL PROGRESS NOTE (cpt [id number])  Hospital Day(s): 4.   Interval History: Patient seen and examined, no acute events or new complaints overnight. Patient reports he is feeling better. No fever, chills, nausea, emesis. He remains without leukocytosis; WBC 7.7. Renal function remains normal; sCr - 0.85; UO - unmeasured. Mild hypokalemia to 3.4. He has had 675 ccs recorded from NGT in last 24 hours. He did undergo gastrografin challenge yesterday and 8-hr delay KUB shows contrast reaching colon, and 24-hr delay shows contrast in the rectum. He is passing flatus but no BM.    Review of Systems:  Constitutional: denies fever, chills  HEENT: denies cough or congestion  Respiratory: denies any shortness of breath  Cardiovascular: denies chest pain or palpitations  Gastrointestinal: denies abdominal pain, N/V, + distension  Genitourinary: denies burning with urination or urinary frequency  Vital signs in last 24 hours: [min-max] current  Temp:  [98.2 F (36.8 C)-98.4 F (36.9 C)] 98.3 F (36.8 C) (12/16 0420) Pulse Rate:  [76-96] 76 (12/16 0420) Resp:  [16-18] 16 (12/16 0420) BP: (155-171)/(85-99) 171/89 (12/16 0420) SpO2:  [96 %-98 %] 96 % (12/16 0420)     Height: 6' (182.9 cm) Weight: 95 kg BMI (Calculated): 28.41   Intake/Output last 2 shifts:  12/15 0701 - 12/16 0700 In: 1188.2 [I.V.:675; NG/GT:90; IV Piggyback:423.2] Out: 675 [Emesis/NG output:675]   Physical Exam:  Constitutional: alert, cooperative and no distress  HENT: normocephalic without obvious abnormality, NGT in place (clamped) Eyes: PERRL, EOM's grossly intact and symmetric  Respiratory: breathing non-labored at rest  Cardiovascular: regular rate and sinus rhythm  Gastrointestinal: soft, non-tender, non-distended, tympanic centrally, no rebound/guarding Musculoskeletal: no edema or wounds, motor and sensation grossly intact, NT    Labs:  CBC Latest Ref Rng & Units 07/26/2021 07/25/2021  07/24/2021  WBC 4.0 - 10.5 K/uL 7.7 9.0 10.0  Hemoglobin 13.0 - 17.0 g/dL 13.8 14.0 15.0  Hematocrit 39.0 - 52.0 % 41.3 42.2 44.0  Platelets 150 - 400 K/uL 242 244 243   CMP Latest Ref Rng & Units 07/26/2021 07/25/2021 07/24/2021  Glucose 70 - 99 mg/dL 83 89 97  BUN 8 - 23 mg/dL 37(H) 30(H) 27(H)  Creatinine 0.61 - 1.24 mg/dL 0.85 0.90 1.15  Sodium 135 - 145 mmol/L 141 140 137  Potassium 3.5 - 5.1 mmol/L 3.4(L) 3.3(L) 3.6  Chloride 98 - 111 mmol/L 112(H) 110 108  CO2 22 - 32 mmol/L 21(L) 21(L) 23  Calcium 8.9 - 10.3 mg/dL 8.5(L) 8.5(L) 8.5(L)  Total Protein 6.5 - 8.1 g/dL - - -  Total Bilirubin 0.3 - 1.2 mg/dL - - -  Alkaline Phos 38 - 126 U/L - - -  AST 15 - 41 U/L - - -  ALT 0 - 44 U/L - - -     Imaging studies:   KUB - 8 hr delay with gastrografin (07/25/2021) personally reviewed which shows contrast reaching the colon, no significant small bowel dilation, and radiologist report reviewed: IMPRESSION: Residual contrast is noted throughout nondilated colon. No definite small bowel dilatation is noted.  KUB - 24 hr delay with gastrografin (07/26/2021) personally reviewed which shows contrast reaching the rectum, no significant small bowel dilation, and radiologist report reviewed below:  IMPRESSION: Contrast throughout the colon to the level of the rectum. No definite signs of small bowel dilation.   Assessment/Plan: (ICD-10's: K43.609) 75 y.o. 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   - Will clamp NGT this morning given  radiographic improvement. Leave NGT clamped x4 hours. If residuals after this time are <150 ccs, we will remove NGT and initiate diet.              - NPO + IVF resuscitation - Monitor abdominal examination; on-going bowel function             - Pain control prn (minimize narcotics); antiemetics prn              - Mobilization encouraged             - No surgical intervention. He understands that if he were  to fail conservative measures or clinically deteriorates, he would require intervention.              - Further management per primary service; we will follow    All of the above findings and recommendations were discussed with the patient, and all of patient's questions were answered to his expressed satisfaction.  -- Lynden Oxford, PA-C [facility] 07/26/2021, 7:25 AM [phone] M-F: 7am - 4pm

## 2021-07-27 LAB — BASIC METABOLIC PANEL
Anion gap: 6 (ref 5–15)
BUN: 31 mg/dL — ABNORMAL HIGH (ref 8–23)
CO2: 23 mmol/L (ref 22–32)
Calcium: 8.7 mg/dL — ABNORMAL LOW (ref 8.9–10.3)
Chloride: 111 mmol/L (ref 98–111)
Creatinine, Ser: 0.83 mg/dL (ref 0.61–1.24)
GFR, Estimated: >60 mL/min (ref >60)
Glucose, Bld: 99 mg/dL (ref 70–99)
Potassium: 3.5 mmol/L (ref 3.5–5.1)
Sodium: 140 mmol/L (ref 135–145)

## 2021-07-27 LAB — CBC
HCT: 42.9 % (ref 39.0–52.0)
Hemoglobin: 14.4 g/dL (ref 13.0–17.0)
MCH: 28.9 pg (ref 26.0–34.0)
MCHC: 33.6 g/dL (ref 30.0–36.0)
MCV: 86.1 fL (ref 80.0–100.0)
Platelets: 247 10*3/uL (ref 150–400)
RBC: 4.98 MIL/uL (ref 4.22–5.81)
RDW: 14.6 % (ref 11.5–15.5)
WBC: 7.9 10*3/uL (ref 4.0–10.5)
nRBC: 0 % (ref 0.0–0.2)

## 2021-07-27 NOTE — Progress Notes (Signed)
PROGRESS NOTE    Keith Craig.  YDX:[id number] DOB: 05/07/1946 DOA: 07/22/2021 PCP: Marguarite Arbour, MD   Assessment & Plan:   Principal Problem:   SBO (small bowel obstruction) (HCC) Active Problems:   COPD (chronic obstructive pulmonary disease) (HCC)   HTN (hypertension)   Esophageal reflux   Nausea vomiting and diarrhea   SBO:  advanced to full liquid diet today as per general surg. Gastrograffin study showed resolving SBO. Small bowel movement overnight as per pt Mobilization encouraged  Hypokalemia: WNL today    GERD: continue on PPI    COPD: w/o exacerbation. Continue on bronchodilators   HTN: continue on hydralazine, metoprolol    DVT prophylaxis:  heparin  Code Status: full  Family Communication:  Disposition Plan: likely d/c back home   Level of care: Telemetry Medical   Status is: Inpatient  Remains inpatient appropriate because: advanced diet to full liquid today & only small BM so far. Can hopefully d/c in 24 hours       Consultants:  General surg   Procedures:   Antimicrobials:   Subjective: Pt c/o malaise   Objective: Vitals:   07/26/21 1504 07/26/21 1933 07/27/21 0415 07/27/21 0804  BP: (!) 157/83 (!) 166/88 (!) 153/90 138/88  Pulse: 82 78 81 83  Resp: 16 20 16 16   Temp: 98.1 F (36.7 C) 98.9 F (37.2 C) 98.9 F (37.2 C) 98.5 F (36.9 C)  TempSrc: Oral Oral Oral Oral  SpO2: 97% 94% 96% 96%  Weight:      Height:        Intake/Output Summary (Last 24 hours) at 07/27/2021 0833 Last data filed at 07/27/2021 07/29/2021 Gross per 24 hour  Intake 1 ml  Output 240 ml  Net -239 ml   Filed Weights   07/22/21 1430 07/23/21 0111  Weight: 96.2 kg 95 kg    Examination:  General exam: Appears comfortable  Respiratory system: clear breath sounds b/l  Cardiovascular system: S1/S2+. No rubs or gallops  Gastrointestinal system: Abd is soft, NT, ND & normal bowel sounds  Central nervous system: Alert and oriented. Moves all  extremities  Psychiatry: judgement and insight appears normal. Appropriate mood and affect     Data Reviewed: I have personally reviewed following labs and imaging studies  CBC: Recent Labs  Lab 07/23/21 0259 07/24/21 0854 07/25/21 0124 07/26/21 0427 07/27/21 0542  WBC 11.4* 10.0 9.0 7.7 7.9  HGB 15.2 15.0 14.0 13.8 14.4  HCT 44.2 44.0 42.2 41.3 42.9  MCV 87.0 87.8 87.6 86.9 86.1  PLT 246 243 244 242 247   Basic Metabolic Panel: Recent Labs  Lab 07/23/21 0259 07/24/21 0854 07/25/21 0124 07/26/21 0427 07/27/21 0542  NA 138 137 140 141 140  K 4.0 3.6 3.3* 3.4* 3.5  CL 106 108 110 112* 111  CO2 24 23 21* 21* 23  GLUCOSE 113* 97 89 83 99  BUN 29* 27* 30* 37* 31*  CREATININE 1.07 1.15 0.90 0.85 0.83  CALCIUM 8.9 8.5* 8.5* 8.5* 8.7*   GFR: Estimated Creatinine Clearance: 93.4 mL/min (by C-G formula based on SCr of 0.83 mg/dL). Liver Function Tests: Recent Labs  Lab 07/22/21 1432 07/23/21 0259  AST 20 17  ALT 15 13  ALKPHOS 66 63  BILITOT 0.9 1.0  PROT 7.9 7.2  ALBUMIN 4.1 3.7   Recent Labs  Lab 07/22/21 1432  LIPASE 26   No results for input(s): AMMONIA in the last 168 hours. Coagulation Profile: No results for input(s): INR,  PROTIME in the last 168 hours. Cardiac Enzymes: No results for input(s): CKTOTAL, CKMB, CKMBINDEX, TROPONINI in the last 168 hours. BNP (last 3 results) No results for input(s): PROBNP in the last 8760 hours. HbA1C: No results for input(s): HGBA1C in the last 72 hours. CBG: No results for input(s): GLUCAP in the last 168 hours. Lipid Profile: No results for input(s): CHOL, HDL, LDLCALC, TRIG, CHOLHDL, LDLDIRECT in the last 72 hours. Thyroid Function Tests: No results for input(s): TSH, T4TOTAL, FREET4, T3FREE, THYROIDAB in the last 72 hours. Anemia Panel: No results for input(s): VITAMINB12, FOLATE, FERRITIN, TIBC, IRON, RETICCTPCT in the last 72 hours. Sepsis Labs: No results for input(s): PROCALCITON, LATICACIDVEN in the  last 168 hours.  Recent Results (from the past 240 hour(s))  Resp Panel by RT-PCR (Flu A&B, Covid) Nasopharyngeal Swab     Status: None   Collection Time: 07/22/21 10:09 PM   Specimen: Nasopharyngeal Swab; Nasopharyngeal(NP) swabs in vial transport medium  Result Value Ref Range Status   SARS Coronavirus 2 by RT PCR NEGATIVE NEGATIVE Final    Comment: (NOTE) SARS-CoV-2 target nucleic acids are NOT DETECTED.  The SARS-CoV-2 RNA is generally detectable in upper respiratory specimens during the acute phase of infection. The lowest concentration of SARS-CoV-2 viral copies this assay can detect is 138 copies/mL. A negative result does not preclude SARS-Cov-2 infection and should not be used as the sole basis for treatment or other patient management decisions. A negative result may occur with  improper specimen collection/handling, submission of specimen other than nasopharyngeal swab, presence of viral mutation(s) within the areas targeted by this assay, and inadequate number of viral copies(<138 copies/mL). A negative result must be combined with clinical observations, patient history, and epidemiological information. The expected result is Negative.  Fact Sheet for Patients:  BloggerCourse.com  Fact Sheet for Healthcare Providers:  SeriousBroker.it  This test is no t yet approved or cleared by the [place] FDA and  has been authorized for detection and/or diagnosis of SARS-CoV-2 by FDA under an Emergency Use Authorization (EUA). This EUA will remain  in effect (meaning this test can be used) for the duration of the COVID-19 declaration under Section 564(b)(1) of the Act, 21 U.S.C.section 360bbb-3(b)(1), unless the authorization is terminated  or revoked sooner.       Influenza A by PCR NEGATIVE NEGATIVE Final   Influenza B by PCR NEGATIVE NEGATIVE Final    Comment: (NOTE) The Xpert Xpress SARS-CoV-2/FLU/RSV plus assay is  intended as an aid in the diagnosis of influenza from Nasopharyngeal swab specimens and should not be used as a sole basis for treatment. Nasal washings and aspirates are unacceptable for Xpert Xpress SARS-CoV-2/FLU/RSV testing.  Fact Sheet for Patients: BloggerCourse.com  Fact Sheet for Healthcare Providers: SeriousBroker.it  This test is not yet approved or cleared by the [place] FDA and has been authorized for detection and/or diagnosis of SARS-CoV-2 by FDA under an Emergency Use Authorization (EUA). This EUA will remain in effect (meaning this test can be used) for the duration of the COVID-19 declaration under Section 564(b)(1) of the Act, 21 U.S.C. section 360bbb-3(b)(1), unless the authorization is terminated or revoked.  Performed at [facility], [address] [place], [place] [address]          Radiology Studies: DG Abd Portable 1V  Result Date: 07/26/2021 CLINICAL DATA:  Small-bowel obstruction an in a 75 year old male. EXAM: PORTABLE ABDOMEN - 1 VIEW COMPARISON:  July 25, 2021. FINDINGS: Lung bases are clear. Contrast is  present throughout the colon to the level of the rectum. Gastric tube likely in proximal stomach or within small hiatal hernia similar position to previous radiograph. Changes of cholecystectomy.  No signs of small bowel dilation. On limited assessment there is no acute skeletal process. IMPRESSION: Contrast throughout the colon to the level of the rectum. No definite signs of small bowel dilation. Electronically Signed   By: Donzetta Kohut M.D.   On: 07/26/2021 08:19   DG Abd Portable 1V-Small Bowel Obstruction Protocol-initial, 8 hr delay  Result Date: 07/25/2021 CLINICAL DATA:  Small bowel obstruction. EXAM: PORTABLE ABDOMEN - 1 VIEW COMPARISON:  Same day. FINDINGS: Residual contrast is noted throughout the nondilated colon. Distal tip of nasogastric tube is seen in expected  position of proximal stomach. Status post cholecystectomy. No definite bowel dilatation is noted. IMPRESSION: Residual contrast is noted throughout nondilated colon. No definite small bowel dilatation is noted. Electronically Signed   By: Lupita Raider M.D.   On: 07/25/2021 18:29        Scheduled Meds:  cloNIDine  0.2 mg Transdermal Weekly   heparin  5,000 Units Subcutaneous Q8H   hydrALAZINE  10 mg Intravenous Q6H   metoprolol tartrate  25 mg Oral Daily   pantoprazole (PROTONIX) IV  40 mg Intravenous Q24H   Continuous Infusions:  promethazine (PHENERGAN) injection (IM or IVPB)       LOS: 5 days    Time spent: 15 mins     Charise Killian, MD [facility] Pager [phone]  If 7PM-7AM, please contact night-coverage 07/27/2021, 8:33 AM

## 2021-07-27 NOTE — Progress Notes (Signed)
[facility] SURGICAL PROGRESS NOTE   Hospital Day(s): 5.   Interval History: Patient seen and examined, no acute events or new complaints overnight.  Patient denies significant abdominal pain at this time.  He confirms a small amount of flatus again overnight, and had a large liquidy bowel movement this morning.  He denies fever, chills, nausea, and vomiting.  His W BC count remained stable at 7.9.  Review of Systems:  Constitutional: denies fever, chills  HEENT: denies cough or congestion  Respiratory: denies any shortness of breath  Cardiovascular: denies chest pain or palpitations  Gastrointestinal: denies abdominal pain, N/V, or diarrhea/and bowel function as per interval history Genitourinary: denies burning with urination or urinary frequency Musculoskeletal: denies pain, decreased motor or sensation Integumentary: denies any other rashes or skin discolorations except Neurological: denies HA or vision/hearing changes   Vital signs in last 24 hours: [min-max] current  Temp:  [98.1 F (36.7 C)-98.9 F (37.2 C)] 98.5 F (36.9 C) (12/17 0804) Pulse Rate:  [78-83] 83 (12/17 0804) Resp:  [16-20] 16 (12/17 0804) BP: (138-166)/(83-90) 138/88 (12/17 0804) SpO2:  [94 %-97 %] 96 % (12/17 0804)     Height: 6' (182.9 cm) Weight: 95 kg BMI (Calculated): 28.41   Intake/Output last 2 shifts:  12/16 0701 - 12/17 0700 In: 1 [P.O.:1] Out: 240 [Emesis/NG output:240]   Physical Exam:  Constitutional: alert, cooperative and no distress  HENT: normocephalic without obvious abnormality  Eyes: PERRL, EOM's grossly intact and symmetric  Respiratory: breathing non-labored at rest  Cardiovascular: regular rate and sinus rhythm  Gastrointestinal: Soft, nondistended, nontender, no rigidity, guarding, rebound tenderness Musculoskeletal: no edema or wounds, motor and sensation grossly intact, NT    Labs:  CBC Latest Ref Rng & Units 07/27/2021 07/26/2021 07/25/2021  WBC 4.0 - 10.5  K/uL 7.9 7.7 9.0  Hemoglobin 13.0 - 17.0 g/dL 08.6 76.1 95.0  Hematocrit 39.0 - 52.0 % 42.9 41.3 42.2  Platelets 150 - 400 K/uL 247 242 244   CMP Latest Ref Rng & Units 07/27/2021 07/26/2021 07/25/2021  Glucose 70 - 99 mg/dL 99 83 89  BUN 8 - 23 mg/dL 93(O) 67(T) 24(P)  Creatinine 0.61 - 1.24 mg/dL 8.09 9.83 3.82  Sodium 135 - 145 mmol/L 140 141 140  Potassium 3.5 - 5.1 mmol/L 3.5 3.4(L) 3.3(L)  Chloride 98 - 111 mmol/L 111 112(H) 110  CO2 22 - 32 mmol/L 23 21(L) 21(L)  Calcium 8.9 - 10.3 mg/dL 5.0(N) 3.9(J) 6.7(H)  Total Protein 6.5 - 8.1 g/dL - - -  Total Bilirubin 0.3 - 1.2 mg/dL - - -  Alkaline Phos 38 - 126 U/L - - -  AST 15 - 41 U/L - - -  ALT 0 - 44 U/L - - -    Imaging studies: No new pertinent imaging studies   Assessment/Plan:  75 y.o. 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  Gastrografin study on 12/15-12/16 demonstrated contrast all the way to the rectum   -Patient able to tolerate his clear liquid diet without nausea and vomiting, will advance to full liquid diet today  -Continue to monitor for bowel function  -As needed pain control and antiemetic  -Encourage mobilization  -No surgical intervention at this time  -Care per primary team  All of the above findings and recommendations were discussed with the patient, patient's family, and the medical team, and all of patient's and family's questions were answered to their expressed satisfaction.  -- Theophilus Kinds, DO

## 2021-07-27 NOTE — Plan of Care (Signed)

## 2021-07-28 LAB — CBC
HCT: 39.4 % (ref 39.0–52.0)
Hemoglobin: 13.7 g/dL (ref 13.0–17.0)
MCH: 30.3 pg (ref 26.0–34.0)
MCHC: 34.8 g/dL (ref 30.0–36.0)
MCV: 87.2 fL (ref 80.0–100.0)
Platelets: 227 10*3/uL (ref 150–400)
RBC: 4.52 MIL/uL (ref 4.22–5.81)
RDW: 14.5 % (ref 11.5–15.5)
WBC: 7.2 10*3/uL (ref 4.0–10.5)
nRBC: 0 % (ref 0.0–0.2)

## 2021-07-28 LAB — BASIC METABOLIC PANEL
Anion gap: 7 (ref 5–15)
BUN: 25 mg/dL — ABNORMAL HIGH (ref 8–23)
CO2: 24 mmol/L (ref 22–32)
Calcium: 8.6 mg/dL — ABNORMAL LOW (ref 8.9–10.3)
Chloride: 107 mmol/L (ref 98–111)
Creatinine, Ser: 1.07 mg/dL (ref 0.61–1.24)
GFR, Estimated: >60 mL/min (ref >60)
Glucose, Bld: 102 mg/dL — ABNORMAL HIGH (ref 70–99)
Potassium: 3.5 mmol/L (ref 3.5–5.1)
Sodium: 138 mmol/L (ref 135–145)

## 2021-07-28 MED ORDER — IRBESARTAN 150 MG PO TABS
150.0000 mg | ORAL_TABLET | Freq: Every day | ORAL | Status: DC
  Administered 2021-07-28: 11:00:00 150 mg via ORAL
  Filled 2021-07-28: qty 1

## 2021-07-28 MED ORDER — IRBESARTAN 150 MG PO TABS: 75.0000 mg | ORAL_TABLET | Freq: Every day | ORAL | Status: DC

## 2021-07-28 MED ORDER — PANTOPRAZOLE SODIUM 40 MG PO TBEC
40.0000 mg | DELAYED_RELEASE_TABLET | Freq: Every day | ORAL | Status: DC
  Administered 2021-07-28: 11:00:00 40 mg via ORAL
  Filled 2021-07-28: qty 1

## 2021-07-28 MED ORDER — ENOXAPARIN SODIUM 40 MG/0.4ML IJ SOSY: 40.0000 mg | PREFILLED_SYRINGE | Freq: Every day | INTRAMUSCULAR | Status: DC

## 2021-07-28 MED ORDER — CLONIDINE HCL 0.1 MG PO TABS: 0.2000 mg | ORAL_TABLET | Freq: Two times a day (BID) | ORAL | Status: DC

## 2021-07-28 NOTE — Discharge Summary (Signed)
Physician Discharge Summary  Keith Craig. ZOX:[id number] DOB: [birth year] DOA: 07/22/2021  PCP: Marguarite Arbour, MD  Admit date: 07/22/2021 Discharge date: 07/28/2021  Admitted From: home  Disposition:  home   Recommendations for Outpatient Follow-up:  Follow up with PCP in 1-2 weeks   Home Health: no  Equipment/Devices:  Discharge Condition: stable  CODE STATUS: full  Diet recommendation: Heart Healthy   Brief/Interim Summary: HPI was taken from Dr. Irena Cords: Keith Craig. is a 75 y.o. male seen in ed with complaints of abdominal pain, since midnight and multiple episode of vomiting.  Pt also had diarrhea. Pt has h/o SBO in past that resolved with lysis.  Pt had multiple episode of vomiting.  Patient is alert awake oriented answers questions and gives history.  States that his last admission he did not need surgical intervention.   Pt has past medical history of small bowel obstruction.   Hospital course from Dr. Mayford Knife 12/14-12/18/22: Pt was found to have SBO that was treated conservatively w/ NPO, NG tube & IVFs. The SBO had resolved prior to d/c and pt was tolerating a po diet prior to d/c as well. For more information, please see previous progress note/consult notes.   Discharge Diagnoses:  Principal Problem:   SBO (small bowel obstruction) (HCC) Active Problems:   COPD (chronic obstructive pulmonary disease) (HCC)   HTN (hypertension)   Esophageal reflux   Nausea vomiting and diarrhea  SBO:  advanced to soft diet today as per general surg. Gastrograffin study showed resolving SBO. Multiple bowel movements yesterday afternoon. Mobilization encouraged  Hypokalemia: WNL today    GERD: continue on PPI    COPD: w/o exacerbation. Continue on bronchodilators   HTN: continue on hydralazine, metoprolol   Discharge Instructions  Discharge Instructions     Diet - low sodium heart healthy   Complete by: As directed    Continue on soft diet and  advance as tolerated   Discharge instructions   Complete by: As directed    F/u w/ PCP in 1-2 weeks   Increase activity slowly   Complete by: As directed       Allergies as of 07/28/2021       Reactions   Bactrim [sulfamethoxazole-trimethoprim] Nausea And Vomiting   Vitamin B12 [cyanocobalamin] Itching, Rash   Injection, tolerates the tablets         Medication List     TAKE these medications    albuterol 108 (90 Base) MCG/ACT inhaler Commonly known as: VENTOLIN HFA Inhale 2 puffs into the lungs every 6 (six) hours as needed for wheezing or shortness of breath.   cloNIDine 0.2 MG tablet Commonly known as: CATAPRES Take 0.2 mg by mouth 2 (two) times daily.   finasteride 5 MG tablet Commonly known as: PROSCAR TAKE 1 TABLET BY MOUTH EVERY DAY   fluticasone 50 MCG/ACT nasal spray Commonly known as: FLONASE Place 1 spray into both nostrils daily as needed for allergies or rhinitis.   Fluticasone-Salmeterol 500-50 MCG/DOSE Aepb Commonly known as: ADVAIR Inhale 1 puff into the lungs 2 (two) times daily.   metoprolol tartrate 25 MG tablet Commonly known as: LOPRESSOR Take 1 tablet by mouth daily.   montelukast 10 MG tablet Commonly known as: SINGULAIR Take 10 mg by mouth at bedtime.   pantoprazole 20 MG tablet Commonly known as: PROTONIX Take 20 mg by mouth 2 (two) times daily.   predniSONE 5 MG tablet Commonly known as: DELTASONE Take 5 mg by mouth daily  with breakfast.   tamsulosin 0.4 MG Caps capsule Commonly known as: FLOMAX TAKE 1 CAPSULE BY MOUTH EVERY DAY   valsartan 80 MG tablet Commonly known as: DIOVAN Take 80 mg by mouth 2 (two) times daily.        Allergies  Allergen Reactions   Bactrim [Sulfamethoxazole-Trimethoprim] Nausea And Vomiting   Vitamin B12 [Cyanocobalamin] Itching and Rash    Injection, tolerates the tablets     Consultations: General surg, Dr. Aleen Campi   Procedures/Studies: CT ABDOMEN PELVIS WO CONTRAST  Result  Date: 07/22/2021 CLINICAL DATA:  Bowel obstruction suspected. Generalized abdominal pain. Nausea, vomiting. EXAM: CT ABDOMEN AND PELVIS WITHOUT CONTRAST TECHNIQUE: Multidetector CT imaging of the abdomen and pelvis was performed following the standard protocol without IV contrast. COMPARISON:  05/14/2020 FINDINGS: Lower chest: Moderate-sized hiatal hernia.  No acute abnormality Hepatobiliary: No focal liver abnormality is seen. Status post cholecystectomy. No biliary dilatation. Pancreas: No focal abnormality or ductal dilatation. Spleen: No focal abnormality.  Normal size. Adrenals/Urinary Tract: 6 cm cyst in the midpole of the left kidney, unchanged. No hydronephrosis or stones. Adrenal glands unremarkable. Small diverticulum off the left lateral wall of the bladder, otherwise unremarkable. Stomach/Bowel: Normal appendix. Few scattered colonic diverticula. No active diverticulitis. Dilated central abdominal small bowel loops. Distal small bowel is decompressed. Transition appears to be in the right lobe abdomen/upper pelvis in an area that appears tethered, presumably adhesions. Stomach is decompressed. Vascular/Lymphatic: Aortic atherosclerosis. No evidence of aneurysm or adenopathy. Reproductive: Mildly prominent prostate with central calcifications. Other: No free fluid or free air. Bilateral inguinal hernias containing fat. Musculoskeletal: No acute bony abnormality. IMPRESSION: Dilated central small bowel loops with decompressed distal small bowel compatible with small-bowel obstruction. This appears to be due to adhesions in the right lower abdomen/upper pelvis. Aortic atherosclerosis. Moderate-sized hiatal hernia. Electronically Signed   By: Charlett Nose M.D.   On: 07/22/2021 17:54   DG Abd 2 Views  Result Date: 07/25/2021 CLINICAL DATA:  Follow-up small bowel obstruction. EXAM: ABDOMEN - 2 VIEW COMPARISON:  Radiographs from yesterday. FINDINGS: The NG tube is in good position, unchanged. Stable  hiatal hernia. Scattered air throughout the colon. There may be a few persistent air-filled loops of small but overall much improved bowel gas pattern consistent with resolving small bowel obstruction. No free air. IMPRESSION: Resolving small-bowel obstruction. Electronically Signed   By: Rudie Meyer M.D.   On: 07/25/2021 08:11   DG Abd 2 Views  Result Date: 07/24/2021 CLINICAL DATA:  Small-bowel obstruction, abdominal pain EXAM: ABDOMEN - 2 VIEW COMPARISON:  CT examination dated July 22, 2021 FINDINGS: There are dilated small bowel loops in the midabdomen measuring up to 4.7 cm concerning for bowel obstruction/ileus. The distal small bowel and colonic loops are normal in caliber. NG tube with distal tip in the pyloric region is noted. Small hiatal hernia. There is no evidence of free air. No radio-opaque calculi or other significant radiographic abnormality is seen. Degenerative disease of the lumbar spine. IMPRESSION: Dilated small bowel loops in the mid abdomen concerning for obstruction. Electronically Signed   By: Larose Hires D.O.   On: 07/24/2021 09:49   DG Abd Portable 1V  Result Date: 07/26/2021 CLINICAL DATA:  Small-bowel obstruction an in a 75 year old male. EXAM: PORTABLE ABDOMEN - 1 VIEW COMPARISON:  July 25, 2021. FINDINGS: Lung bases are clear. Contrast is present throughout the colon to the level of the rectum. Gastric tube likely in proximal stomach or within small hiatal hernia similar position to previous radiograph.  Changes of cholecystectomy.  No signs of small bowel dilation. On limited assessment there is no acute skeletal process. IMPRESSION: Contrast throughout the colon to the level of the rectum. No definite signs of small bowel dilation. Electronically Signed   By: Donzetta Kohut M.D.   On: 07/26/2021 08:19   DG Abd Portable 1V-Small Bowel Obstruction Protocol-initial, 8 hr delay  Result Date: 07/25/2021 CLINICAL DATA:  Small bowel obstruction. EXAM: PORTABLE  ABDOMEN - 1 VIEW COMPARISON:  Same day. FINDINGS: Residual contrast is noted throughout the nondilated colon. Distal tip of nasogastric tube is seen in expected position of proximal stomach. Status post cholecystectomy. No definite bowel dilatation is noted. IMPRESSION: Residual contrast is noted throughout nondilated colon. No definite small bowel dilatation is noted. Electronically Signed   By: Lupita Raider M.D.   On: 07/25/2021 18:29   DG Abd Portable 1 View  Result Date: 07/22/2021 CLINICAL DATA:  NG tube placement EXAM: PORTABLE ABDOMEN - 1 VIEW COMPARISON:  07/29/2017 FINDINGS: NG tube tip is in the proximal stomach. Moderate-sized hiatal hernia. Mildly prominent small bowel loops. IMPRESSION: NG tube in the proximal stomach. Electronically Signed   By: Charlett Nose M.D.   On: 07/22/2021 23:15   (Echo, Carotid, EGD, Colonoscopy, ERCP)    Subjective: Pt c/o fatigue    Discharge Exam: Vitals:   07/28/21 0835 07/28/21 1054  BP: (!) 151/85 (!) 147/87  Pulse: 85 77  Resp: 20   Temp:    SpO2: 97%    Vitals:   07/27/21 1957 07/28/21 0404 07/28/21 0835 07/28/21 1054  BP: (!) 146/85 139/74 (!) 151/85 (!) 147/87  Pulse: 80 81 85 77  Resp: 16 16 20    Temp: 98.5 F (36.9 C) 98.9 F (37.2 C)    TempSrc:  Oral    SpO2: 96% 95% 97%   Weight:      Height:        General: Pt is alert, awake, not in acute distress Cardiovascular: S1/S2 +, no rubs, no gallops Respiratory: CTA bilaterally, no wheezing, no rhonchi Abdominal: Soft, NT, ND, bowel sounds + Extremities: no edema, no cyanosis    The results of significant diagnostics from this hospitalization (including imaging, microbiology, ancillary and laboratory) are listed below for reference.     Microbiology: Recent Results (from the past 240 hour(s))  Resp Panel by RT-PCR (Flu A&B, Covid) Nasopharyngeal Swab     Status: None   Collection Time: 07/22/21 10:09 PM   Specimen: Nasopharyngeal Swab; Nasopharyngeal(NP) swabs in  vial transport medium  Result Value Ref Range Status   SARS Coronavirus 2 by RT PCR NEGATIVE NEGATIVE Final    Comment: (NOTE) SARS-CoV-2 target nucleic acids are NOT DETECTED.  The SARS-CoV-2 RNA is generally detectable in upper respiratory specimens during the acute phase of infection. The lowest concentration of SARS-CoV-2 viral copies this assay can detect is 138 copies/mL. A negative result does not preclude SARS-Cov-2 infection and should not be used as the sole basis for treatment or other patient management decisions. A negative result may occur with  improper specimen collection/handling, submission of specimen other than nasopharyngeal swab, presence of viral mutation(s) within the areas targeted by this assay, and inadequate number of viral copies(<138 copies/mL). A negative result must be combined with clinical observations, patient history, and epidemiological information. The expected result is Negative.  Fact Sheet for Patients:  14/12/22  Fact Sheet for Healthcare Providers:  BloggerCourse.com  This test is no t yet approved or cleared by the [employer]  States FDA and  has been authorized for detection and/or diagnosis of SARS-CoV-2 by FDA under an Emergency Use Authorization (EUA). This EUA will remain  in effect (meaning this test can be used) for the duration of the COVID-19 declaration under Section 564(b)(1) of the Act, 21 U.S.C.section 360bbb-3(b)(1), unless the authorization is terminated  or revoked sooner.       Influenza A by PCR NEGATIVE NEGATIVE Final   Influenza B by PCR NEGATIVE NEGATIVE Final    Comment: (NOTE) The Xpert Xpress SARS-CoV-2/FLU/RSV plus assay is intended as an aid in the diagnosis of influenza from Nasopharyngeal swab specimens and should not be used as a sole basis for treatment. Nasal washings and aspirates are unacceptable for Xpert Xpress SARS-CoV-2/FLU/RSV testing.  Fact  Sheet for Patients: BloggerCourse.com  Fact Sheet for Healthcare Providers: SeriousBroker.it  This test is not yet approved or cleared by the [place] FDA and has been authorized for detection and/or diagnosis of SARS-CoV-2 by FDA under an Emergency Use Authorization (EUA). This EUA will remain in effect (meaning this test can be used) for the duration of the COVID-19 declaration under Section 564(b)(1) of the Act, 21 U.S.C. section 360bbb-3(b)(1), unless the authorization is terminated or revoked.  Performed at [facility], [address] [place], [place] [address]      Labs: BNP (last 3 results) No results for input(s): BNP in the last 8760 hours. Basic Metabolic Panel: Recent Labs  Lab 07/24/21 0854 07/25/21 0124 07/26/21 0427 07/27/21 0542 07/28/21 0450  NA 137 140 141 140 138  K 3.6 3.3* 3.4* 3.5 3.5  CL 108 110 112* 111 107  CO2 23 21* 21* 23 24  GLUCOSE 97 89 83 99 102*  BUN 27* 30* 37* 31* 25*  CREATININE 1.15 0.90 0.85 0.83 1.07  CALCIUM 8.5* 8.5* 8.5* 8.7* 8.6*   Liver Function Tests: Recent Labs  Lab 07/22/21 1432 07/23/21 0259  AST 20 17  ALT 15 13  ALKPHOS 66 63  BILITOT 0.9 1.0  PROT 7.9 7.2  ALBUMIN 4.1 3.7   Recent Labs  Lab 07/22/21 1432  LIPASE 26   No results for input(s): AMMONIA in the last 168 hours. CBC: Recent Labs  Lab 07/24/21 0854 07/25/21 0124 07/26/21 0427 07/27/21 0542 07/28/21 0450  WBC 10.0 9.0 7.7 7.9 7.2  HGB 15.0 14.0 13.8 14.4 13.7  HCT 44.0 42.2 41.3 42.9 39.4  MCV 87.8 87.6 86.9 86.1 87.2  PLT 243 244 242 247 227   Cardiac Enzymes: No results for input(s): CKTOTAL, CKMB, CKMBINDEX, TROPONINI in the last 168 hours. BNP: Invalid input(s): POCBNP CBG: No results for input(s): GLUCAP in the last 168 hours. D-Dimer No results for input(s): DDIMER in the last 72 hours. Hgb A1c No results for input(s): HGBA1C in the last 72 hours. Lipid  Profile No results for input(s): CHOL, HDL, LDLCALC, TRIG, CHOLHDL, LDLDIRECT in the last 72 hours. Thyroid function studies No results for input(s): TSH, T4TOTAL, T3FREE, THYROIDAB in the last 72 hours.  Invalid input(s): FREET3 Anemia work up No results for input(s): VITAMINB12, FOLATE, FERRITIN, TIBC, IRON, RETICCTPCT in the last 72 hours. Urinalysis    Component Value Date/Time   COLORURINE AMBER (A) 07/28/2017 2146   APPEARANCEUR Cloudy (A) 03/19/2021 1551   LABSPEC 1.031 (H) 07/28/2017 2146   LABSPEC 1.030 12/24/2013 1258   PHURINE 5.0 07/28/2017 2146   GLUCOSEU Negative 03/19/2021 1551   GLUCOSEU Negative 12/24/2013 1258   HGBUR SMALL (A) 07/28/2017 2146   BILIRUBINUR Negative  03/19/2021 1551   BILIRUBINUR Negative 12/24/2013 1258   KETONESUR NEGATIVE 07/28/2017 2146   PROTEINUR 2+ (A) 03/19/2021 1551   PROTEINUR 30 (A) 07/28/2017 2146   UROBILINOGEN 0.2 12/03/2010 1359   NITRITE Negative 03/19/2021 1551   NITRITE NEGATIVE 07/28/2017 2146   LEUKOCYTESUR 3+ (A) 03/19/2021 1551   LEUKOCYTESUR 2+ 12/24/2013 1258   Sepsis Labs Invalid input(s): PROCALCITONIN,  WBC,  LACTICIDVEN Microbiology Recent Results (from the past 240 hour(s))  Resp Panel by RT-PCR (Flu A&B, Covid) Nasopharyngeal Swab     Status: None   Collection Time: 07/22/21 10:09 PM   Specimen: Nasopharyngeal Swab; Nasopharyngeal(NP) swabs in vial transport medium  Result Value Ref Range Status   SARS Coronavirus 2 by RT PCR NEGATIVE NEGATIVE Final    Comment: (NOTE) SARS-CoV-2 target nucleic acids are NOT DETECTED.  The SARS-CoV-2 RNA is generally detectable in upper respiratory specimens during the acute phase of infection. The lowest concentration of SARS-CoV-2 viral copies this assay can detect is 138 copies/mL. A negative result does not preclude SARS-Cov-2 infection and should not be used as the sole basis for treatment or other patient management decisions. A negative result may occur with   improper specimen collection/handling, submission of specimen other than nasopharyngeal swab, presence of viral mutation(s) within the areas targeted by this assay, and inadequate number of viral copies(<138 copies/mL). A negative result must be combined with clinical observations, patient history, and epidemiological information. The expected result is Negative.  Fact Sheet for Patients:  BloggerCourse.com  Fact Sheet for Healthcare Providers:  [employer]iousBroker[employer]it  This test is no t yet approved or cleared by the [place] FDA and  has been authorized for detection and/or diagnosis of SARS-CoV-2 by FDA under an Emergency Use Authorization (EUA). This EUA will remain  in effect (meaning this test can be used) for the duration of the COVID-19 declaration under Section 564(b)(1) of the Act, 21 U.S.C.section 360bbb-3(b)(1), unless the authorization is terminated  or revoked sooner.       Influenza A by PCR NEGATIVE NEGATIVE Final   Influenza B by PCR NEGATIVE NEGATIVE Final    Comment: (NOTE) The Xpert Xpress SARS-CoV-2/FLU/RSV plus assay is intended as an aid in the diagnosis of influenza from Nasopharyngeal swab specimens and should not be used as a sole basis for treatment. Nasal washings and aspirates are unacceptable for Xpert Xpress SARS-CoV-2/FLU/RSV testing.  Fact Sheet for Patients: BloggerCourse.com  Fact Sheet for Healthcare Providers: SeriousBroker.it  This test is not yet approved or cleared by the [place] FDA and has been authorized for detection and/or diagnosis of SARS-CoV-2 by FDA under an Emergency Use Authorization (EUA). This EUA will remain in effect (meaning this test can be used) for the duration of the COVID-19 declaration under Section 564(b)(1) of the Act, 21 U.S.C. section 360bbb-3(b)(1), unless the authorization is terminated  or revoked.  Performed at [facility], [address], [place], [place] [address]      Time coordinating discharge: Over 30 minutes  SIGNED:   Charise Killian, MD  [facility] 07/28/2021, 1:27 PM Pager   If 7PM-7AM, please contact night-coverage

## 2021-07-28 NOTE — Progress Notes (Signed)
PHARMACIST - PHYSICIAN COMMUNICATION  CONCERNING: IV to Oral Route Change Policy  RECOMMENDATION: This patient is receiving pantoprazole by the intravenous route.  Based on criteria approved by the Pharmacy and Therapeutics Committee, the intravenous medication(s) is/are being converted to the equivalent oral dose form(s).   DESCRIPTION: These criteria include: The patient is eating (either orally or via tube) and/or has been taking other orally administered medications for a least 24 hours The patient has no evidence of active gastrointestinal bleeding or impaired GI absorption (gastrectomy, short bowel, patient on TNA or NPO).  If you have questions about this conversion, please contact the Pharmacy Department   Tressie Ellis, [facility] 07/28/2021 10:15 AM

## 2021-07-28 NOTE — Progress Notes (Signed)
[facility] SURGICAL PROGRESS NOTE   Hospital Day(s): 6.   Interval History: Patient seen and examined, no acute events or new complaints overnight. Patient reports continued improvement of his abdominal pain.  He was able to tolerate his full liquid diet without nausea or vomiting.  He confirms bowel movements overnight and is passing flatus.  Denies fever, chills.  Good urine output with 250 cc measured overnight and 3 unmeasurable occurrences.  Review of Systems:  Constitutional: denies fever, chills  HEENT: denies cough or congestion  Respiratory: denies any shortness of breath  Cardiovascular: denies chest pain or palpitations  Gastrointestinal: denies abdominal pain, N/V Genitourinary: denies burning with urination or urinary frequency Musculoskeletal: denies pain, decreased motor or sensation Integumentary: denies any other rashes or skin discolorations  Vital signs in last 24 hours: [min-max] current  Temp:  [98.2 F (36.8 C)-98.9 F (37.2 C)] 98.9 F (37.2 C) (12/18 0404) Pulse Rate:  [72-85] 77 (12/18 1054) Resp:  [16-20] 20 (12/18 0835) BP: (139-151)/(74-87) 147/87 (12/18 1054) SpO2:  [95 %-97 %] 97 % (12/18 0835)     Height: 6' (182.9 cm) Weight: 95 kg BMI (Calculated): 28.41   Intake/Output last 2 shifts:  12/17 0701 - 12/18 0700 In: 1500 [P.O.:1500] Out: 250 [Urine:250]   Physical Exam:  Constitutional: alert, cooperative and no distress  HENT: normocephalic without obvious abnormality  Eyes: PERRL, EOM's grossly intact and symmetric  Respiratory: breathing non-labored at rest  Cardiovascular: regular rate and sinus rhythm  Gastrointestinal: soft, non-tender, and non-distended, no rigidity, guarding, or rebound tenderness Musculoskeletal: no edema or wounds, motor and sensation grossly intact, NT    Labs:  CBC Latest Ref Rng & Units 07/28/2021 07/27/2021 07/26/2021  WBC 4.0 - 10.5 K/uL 7.2 7.9 7.7  Hemoglobin 13.0 - 17.0 g/dL 16.1 09.6 04.5   Hematocrit 39.0 - 52.0 % 39.4 42.9 41.3  Platelets 150 - 400 K/uL 227 247 242   CMP Latest Ref Rng & Units 07/28/2021 07/27/2021 07/26/2021  Glucose 70 - 99 mg/dL 409(W) 99 83  BUN 8 - 23 mg/dL 11(B) 14(N) 82(N)  Creatinine 0.61 - 1.24 mg/dL 5.62 1.30 8.65  Sodium 135 - 145 mmol/L 138 140 141  Potassium 3.5 - 5.1 mmol/L 3.5 3.5 3.4(L)  Chloride 98 - 111 mmol/L 107 111 112(H)  CO2 22 - 32 mmol/L 24 23 21(L)  Calcium 8.9 - 10.3 mg/dL 7.8(I) 6.9(G) 2.9(B)  Total Protein 6.5 - 8.1 g/dL - - -  Total Bilirubin 0.3 - 1.2 mg/dL - - -  Alkaline Phos 38 - 126 U/L - - -  AST 15 - 41 U/L - - -  ALT 0 - 44 U/L - - -    Imaging studies: No new pertinent imaging studies   Assessment/Plan:  75 y.o. 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  Gastrografin study on 12/15-2012/16 demonstrated contrast all the way to the rectum.   -Patient with return of bowel function  -Diet advanced to GI soft  -As needed pain control and antiemetic  -No acute surgical intervention at this time  -Encourage ambulation  -Stable for discharge from general surgery standpoint if patient is able to tolerate GI soft diet without nausea and vomiting  All of the above findings and recommendations were discussed with the patient, and the medical team, and all of patient's questions were answered to his expressed satisfaction.   -- Theophilus Kinds, DO

## 2021-07-28 NOTE — Progress Notes (Signed)
Keith Craig. to be D/C'd Home per MD order.  Discussed with the patient and all questions fully answered.  VSS, Skin clean, dry and intact without evidence of skin break down, no evidence of skin tears noted. IV catheter discontinued intact. Site without signs and symptoms of complications. Dressing and pressure applied.  An After Visit Summary was printed and given to the patient. No changes were made to patient prescriptions.  D/c education completed with patient/family including follow up instructions, medication list, d/c activities limitations if indicated, with other d/c instructions as indicated by MD - patient able to verbalize understanding, all questions fully answered.   Patient instructed to return to ED, call 911, or call MD for any changes in condition.   Patient escorted via WC, and D/C home via private auto.  [name] 07/28/2021 1:37 PM

## 2021-09-18 NOTE — Progress Notes (Signed)
09/19/2021 10:18 AM   Keith Craig 20-Oct-1945 [id number]  Referring provider: Idelle Crouch, MD [facility] [place],  [place] [address]   Chief Complaint  Patient presents with   Benign Prostatic Hypertrophy   Hematuria    Urological history: 1. Urethral stricture -Balloon dilation of bulbar urethra 06/2020 -cysto 03/2021 - bulbar urethral stricture -managed with self cathing twice weekly  2. BPH with LU TS -PSA 0.4 04/2020 -I PSS 5/2 -PVR 47 mL -managed with tamsulosin 0.4 mg and finasteride 5 mg daily  3. 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 No hydronephrosis. No suspicious renal cortical masses. No evidence of urothelial lesions, with limitations as described.  Mild diffuse bladder wall thickening and trabeculation with small left bladder wall diverticulum, suggesting chronic bladder outlet obstruction -cysto with strictures 2021 -contrast CT 2022 - benign renal cyst  -cysto 2022 - false passages, friable prostate  -no complaint of gross heme -UA 10-50 RBC's   4. Renal cyst -non-contrast 07/2021 6 cm in left kidney  HPI: Keith Craigis a 76 y.o.  male who presents today for one year follow up.  He has an occasional split urinary stream.  He is not cathing himself as he is having a good flow.  Patient denies any modifying or aggravating factors.  Patient denies any gross hematuria, dysuria or suprapubic/flank pain.  Patient denies any fevers, chills, nausea or vomiting.     IPSS     Row Name 09/19/21 1000         International Prostate Symptom Score   How often have you had the sensation of not emptying your bladder? Not at All     How often have you had to urinate less than every two hours? Less than 1 in 5 times     How often have you found you stopped and started again several times when you urinated? Less than 1 in 5 times     How often have you found it difficult to postpone  urination? Not at All     How often have you had a weak urinary stream? Less than 1 in 5 times     How often have you had to strain to start urination? Not at All     How many times did you typically get up at night to urinate? 2 Times     Total IPSS Score 5       Quality of Life due to urinary symptoms   If you were to spend the rest of your life with your urinary condition just the way it is now how would you feel about that? Mostly Satisfied              Score:  1-7 Mild 8-19 Moderate 20-35 Severe   PMH: Past Medical History:  Diagnosis Date   Arthritis    Benign essential microscopic hematuria    BPH with obstruction/lower urinary tract symptoms    Chills    Chronic airway obstruction (HCC)    Chronic obstructive asthma (HCC)    Esophageal reflux    HTN (hypertension)    Over weight     Surgical History: Past Surgical History:  Procedure Laterality Date   cataract surgery Bilateral    CHOLECYSTECTOMY     CYSTOSCOPY W/ RETROGRADES Right 06/25/2020   Procedure: CYSTOSCOPY WITH RETROGRADE PYELOGRAM;  Surgeon: Hollice Espy, MD;  Location: [facility];  Service: Urology;  Laterality: Right;  CYSTOSCOPY WITH DIRECT VISION INTERNAL URETHROTOMY N/A 06/25/2020   Procedure: CYSTOSCOPY WITH DIRECT VISION INTERNAL URETHROTOMY;  Surgeon: Hollice Espy, MD;  Location: [facility];  Service: Urology;  Laterality: N/A;   CYSTOSCOPY WITH RETROGRADE URETHROGRAM N/A 06/25/2020   Procedure: CYSTOSCOPY WITH RETROGRADE URETHROGRAM;  Surgeon: Hollice Espy, MD;  Location: [facility] ORS;  Service: Urology;  Laterality: N/A;   CYSTOSCOPY WITH URETHRAL DILATATION N/A 06/25/2020   Procedure: CYSTOSCOPY WITH URETHRAL DILATATION;  Surgeon: Hollice Espy, MD;  Location: [facility];  Service: Urology;  Laterality: N/A;   HIATAL HERNIA REPAIR     microvascular decompression of left trigeminal nerve     RHIZOTOMY     small bowel restrictions     URETHRAL STRICTURE DILATATION      Home  Medications:  Allergies as of 09/19/2021       Reactions   Bactrim [sulfamethoxazole-trimethoprim] Nausea And Vomiting   Vitamin B12 [cyanocobalamin] Itching, Rash   Injection, tolerates the tablets         Medication List        Accurate as of September 19, 2021 10:18 AM. If you have any questions, ask your nurse or doctor.          albuterol 108 (90 Base) MCG/ACT inhaler Commonly known as: VENTOLIN HFA Inhale 2 puffs into the lungs every 6 (six) hours as needed for wheezing or shortness of breath.   baclofen 20 MG tablet Commonly known as: LIORESAL Take by mouth.   cloNIDine 0.2 MG tablet Commonly known as: CATAPRES Take 0.2 mg by mouth 2 (two) times daily.   finasteride 5 MG tablet Commonly known as: PROSCAR TAKE 1 TABLET BY MOUTH EVERY DAY   fluticasone 50 MCG/ACT nasal spray Commonly known as: FLONASE Place 1 spray into both nostrils daily as needed for allergies or rhinitis.   Fluticasone-Salmeterol 500-50 MCG/DOSE Aepb Commonly known as: ADVAIR Inhale 1 puff into the lungs 2 (two) times daily.   metoprolol tartrate 25 MG tablet Commonly known as: LOPRESSOR Take 1 tablet by mouth daily.   montelukast 10 MG tablet Commonly known as: SINGULAIR Take 10 mg by mouth at bedtime.   pantoprazole 20 MG tablet Commonly known as: PROTONIX Take 20 mg by mouth 2 (two) times daily.   predniSONE 5 MG tablet Commonly known as: DELTASONE Take 5 mg by mouth daily with breakfast.   tamsulosin 0.4 MG Caps capsule Commonly known as: FLOMAX TAKE 1 CAPSULE BY MOUTH EVERY DAY   valsartan 80 MG tablet Commonly known as: DIOVAN Take 80 mg by mouth 2 (two) times daily.        Allergies:  Allergies  Allergen Reactions   Bactrim [Sulfamethoxazole-Trimethoprim] Nausea And Vomiting   Vitamin B12 [Cyanocobalamin] Itching and Rash    Injection, tolerates the tablets     Family History: Family History  Problem Relation Age of Onset   Esophagitis Mother     Arthritis/Rheumatoid Father    Kidney disease Neg Hx    Prostate cancer Neg Hx    Kidney cancer Neg Hx    Bladder Cancer Neg Hx     Social History:  reports that he quit smoking about 52 years ago. His smoking use included cigarettes. He has never used smokeless tobacco. He reports that he does not drink alcohol and does not use drugs.  ROS: Pertinent ROS in HPI  Physical Exam: BP 131/83    Pulse 61    Ht 6' (1.829 m)    Wt 208 lb (94.3 kg)  BMI 28.21 kg/m   Constitutional:  Well nourished. Alert and oriented, No acute distress. HEENT: [place] AT, moist mucus membranes.  Trachea midline Cardiovascular: No clubbing, cyanosis, or edema. Respiratory: Normal respiratory effort, no increased work of breathing. Neurologic: Grossly intact, no focal deficits, moving all 4 extremities. Psychiatric: Normal mood and affect.   Laboratory Data: Glucose 70 - 110 mg/dL 87   Sodium 136 - 145 mmol/L 143   Potassium 3.6 - 5.1 mmol/L 4.2   Chloride 97 - 109 mmol/L 107   Carbon Dioxide (CO2) 22.0 - 32.0 mmol/L 24.8   Urea Nitrogen (BUN) 7 - 25 mg/dL 20   Creatinine 0.7 - 1.3 mg/dL 1.3   Glomerular Filtration Rate (eGFR), MDRD Estimate >60 mL/min/1.73sq m 54 Low    Calcium 8.7 - 10.3 mg/dL 9.3   AST  8 - 39 U/L 9   ALT  6 - 57 U/L 7   Alk Phos (alkaline Phosphatase) 34 - 104 U/L 56   Albumin 3.5 - 4.8 g/dL 3.6   Bilirubin, Total 0.3 - 1.2 mg/dL 0.5   Protein, Total 6.1 - 7.9 g/dL 6.2   A/G Ratio 1.0 - 5.0 gm/dL 1.4   Resulting Agency  [facility]  Specimen Collected: 09/03/21 07:21 Last Resulted: 09/03/21 18:22  Received From: [place]  Result Received: 09/18/21 21:36   WBC (White Blood Cell Count) 4.1 - 10.2 103/uL 6.7   RBC (Red Blood Cell Count) 4.69 - 6.13 106/uL 4.60 Low    Hemoglobin 14.1 - 18.1 gm/dL 13.6 Low    Hematocrit 40.0 - 52.0 % 41.1   MCV (Mean Corpuscular Volume) 80.0 - 100.0 fl 89.3   MCH (Mean Corpuscular Hemoglobin) 27.0 - 31.2 pg 29.6    MCHC (Mean Corpuscular Hemoglobin Concentration) 32.0 - 36.0 gm/dL 33.1   Platelet Count 150 - 450 103/uL 244   RDW-CV (Red Cell Distribution Width) 11.6 - 14.8 % 14.3   MPV (Mean Platelet Volume) 9.4 - 12.4 fl 11.2   Neutrophils 1.50 - 7.80 103/uL 2.98   Lymphocytes 1.00 - 3.60 103/uL 2.29   Monocytes 0.00 - 1.50 103/uL 0.81   Eosinophils 0.00 - 0.55 103/uL 0.48   Basophils 0.00 - 0.09 103/uL 0.08   Neutrophil % 32.0 - 70.0 % 44.8   Lymphocyte % 10.0 - 50.0 % 34.4   Monocyte % 4.0 - 13.0 % 12.2   Eosinophil % 1.0 - 5.0 % 7.2 High    Basophil% 0.0 - 2.0 % 1.2   Immature Granulocyte % <=0.7 % 0.2   Immature Granulocyte Count <=0.06 10^3/L 0.01   Resulting Agency  [place] [facility] - LAB  Specimen Collected: 09/03/21 07:21 Last Resulted: 09/03/21 07:55  Received From: [place]  Result Received: 09/18/21 21:36   Cholesterol, Total 100 - 200 mg/dL 163   Triglyceride 35 - 199 mg/dL 91   HDL (High Density Lipoprotein) Cholesterol 29.0 - 71.0 mg/dL 53.2   LDL Calculated 0 - 130 mg/dL 92   VLDL Cholesterol mg/dL 18   Cholesterol/HDL Ratio  3.1   Resulting Agency  [facility] - LAB  Specimen Collected: 09/03/21 07:21 Last Resulted: 09/03/21 18:22  Received From: [place]  Result Received: 09/18/21 21:36   Color Yellow, Violet, Light Violet, Dark Violet Yellow   Clarity Clear, Other Cloudy Abnormal    Specific Gravity 1.000 - 1.030 1.020   pH, Urine 5.0 - 8.0 6.0   Protein, Urinalysis Negative, Trace mg/dL Negative  Glucose, Urinalysis Negative mg/dL Negative   Ketones, Urinalysis Negative mg/dL Negative   Blood, Urinalysis Negative Large Abnormal    Nitrite, Urinalysis Negative Negative   Leukocyte Esterase, Urinalysis Negative Large Abnormal    White Blood Cells, Urinalysis None Seen, 0-3 /hpf >50 Abnormal    Red Blood Cells, Urinalysis None Seen, 0-3 /hpf 10-50 Abnormal    Bacteria, Urinalysis None Seen /hpf Rare  Abnormal    Squamous Epithelial Cells, Urinalysis Rare, Few, None Seen /hpf Few   Resulting Agency  [facility] - LAB  Specimen Collected: 09/03/21 07:21 Last Resulted: 09/03/21 08:09  Received From: [facility]  Result Received: 09/18/21 21:36   Thyroid Stimulating Hormone (TSH) 0.450-5.330 uIU/ml uIU/mL 5.539 High    Resulting Agency  [facility] - [facility]  Specimen Collected: 09/03/21 07:21 Last Resulted: 09/03/21 15:06  Received From: [facility]  Result Received: 09/18/21 21:36   Hemoglobin A1C 4.2 - 5.6 % 5.6   Average Blood Glucose (Calc) mg/dL [facility] - [facility]  Narrative Performed by [facility] - LAB Normal Range:    4.2 - 5.6%  Increased Risk:  5.7 - 6.4%  Diabetes:        >= 6.5%  Glycemic Control for adults with diabetes:  <7%   Specimen Collected: 09/03/21 07:21 Last Resulted: 09/03/21 10:16  Received From: [facility]  Result Received: 09/18/21 21:36  I have reviewed the labs.   Pertinent Imaging:  09/19/21 10:00  Scan Result 12m     Assessment & Plan:   1. Urethral strictures -no longer cathing -split urinary stream just at the start of urination on an occasional basis -Patient has catheters on hand and will cath himself if the split stream increases in frequency or intensity  2. BPH with LUTS -PVR < 300 cc -most bothersome symptoms are split urinary stream just at the start of urination on an occasional basis -continue conservative management, avoiding bladder irritants and timed voiding's -Continue tamsulosin 0.4 mg daily and finasteride 5 mg daily  3. High risk hematuria -work up completed in 2021 - notable for urethral strictures -no reports of gross heme -UA 10-50 RBC's -He does have a recent cystoscopy and contrast CT with no malignancies identified, so we will continue to monitor at this time  Return for UA, IPSS and PVR.  These  notes generated with voice recognition software. I apologize for typographical errors.  Keith Council PA-C  [facility] [address] [place] [phone]

## 2021-09-19 ENCOUNTER — Ambulatory Visit (INDEPENDENT_AMBULATORY_CARE_PROVIDER_SITE_OTHER): Payer: Medicare Other | Admitting: Urology

## 2021-09-19 ENCOUNTER — Other Ambulatory Visit: Payer: Self-pay

## 2021-09-19 ENCOUNTER — Encounter: Payer: Self-pay | Admitting: Urology

## 2021-09-19 VITALS — BP 131/83 | HR 61 | Ht 72.0 in | Wt 208.0 lb

## 2021-09-19 DIAGNOSIS — N401 Enlarged prostate with lower urinary tract symptoms: Secondary | ICD-10-CM | POA: Diagnosis not present

## 2021-09-19 DIAGNOSIS — R319 Hematuria, unspecified: Secondary | ICD-10-CM | POA: Diagnosis not present

## 2021-09-19 DIAGNOSIS — N138 Other obstructive and reflux uropathy: Secondary | ICD-10-CM | POA: Diagnosis not present

## 2021-09-19 DIAGNOSIS — N35919 Unspecified urethral stricture, male, unspecified site: Secondary | ICD-10-CM | POA: Diagnosis not present

## 2021-09-19 LAB — BLADDER SCAN AMB NON-IMAGING

## 2021-12-13 ENCOUNTER — Telehealth: Payer: Self-pay

## 2021-12-13 NOTE — Telephone Encounter (Signed)
CALLED PATIENT NO ANSWER LEFT VOICEMAIL FOR A CALL BACK °Letter sent °

## 2021-12-17 ENCOUNTER — Other Ambulatory Visit: Payer: Self-pay | Admitting: Sports Medicine

## 2021-12-17 DIAGNOSIS — G8929 Other chronic pain: Secondary | ICD-10-CM

## 2021-12-17 DIAGNOSIS — R29898 Other symptoms and signs involving the musculoskeletal system: Secondary | ICD-10-CM

## 2021-12-17 DIAGNOSIS — R200 Anesthesia of skin: Secondary | ICD-10-CM

## 2021-12-17 DIAGNOSIS — M47816 Spondylosis without myelopathy or radiculopathy, lumbar region: Secondary | ICD-10-CM

## 2021-12-31 ENCOUNTER — Ambulatory Visit
Admission: RE | Admit: 2021-12-31 | Discharge: 2021-12-31 | Disposition: A | Discharge status: Home / Self Care | Payer: Medicare Other | Source: Ambulatory Visit | Attending: Sports Medicine | Admitting: Sports Medicine

## 2021-12-31 DIAGNOSIS — R200 Anesthesia of skin: Secondary | ICD-10-CM | POA: Insufficient documentation

## 2021-12-31 DIAGNOSIS — R29898 Other symptoms and signs involving the musculoskeletal system: Secondary | ICD-10-CM | POA: Insufficient documentation

## 2021-12-31 DIAGNOSIS — R202 Paresthesia of skin: Secondary | ICD-10-CM | POA: Diagnosis present

## 2021-12-31 DIAGNOSIS — M47816 Spondylosis without myelopathy or radiculopathy, lumbar region: Secondary | ICD-10-CM | POA: Insufficient documentation

## 2021-12-31 DIAGNOSIS — G8929 Other chronic pain: Secondary | ICD-10-CM | POA: Insufficient documentation

## 2021-12-31 DIAGNOSIS — M5441 Lumbago with sciatica, right side: Secondary | ICD-10-CM | POA: Diagnosis present

## 2022-03-19 ENCOUNTER — Ambulatory Visit: Payer: Medicare Other | Admitting: Urology

## 2022-03-26 NOTE — Progress Notes (Unsigned)
03/27/2022 9:30 AM   Keith Craig [birth year] [id number]  Referring provider: Idelle Crouch, MD [facility] [place],  [place] [address]   Urological history: 1. 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 BPH with LU TS PSA pending I PSS 5/2 PVR 23 mL Tamsulosin 0.4 mg daily and finasteride 5 mg daily  3. 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 No hydronephrosis. No suspicious renal cortical masses. No evidence of urothelial lesions, with limitations as described.  Mild diffuse bladder wall thickening and trabeculation with small left bladder wall diverticulum, suggesting chronic bladder outlet obstruction cysto with strictures 2021 Non contrast CT 2022 - benign renal cyst  cysto 2022 - false passages, friable prostate  no complaint of gross heme UA 11-30 RBC's - but with pyuria and bacteruria   4. Renal cyst non-contrast 07/2021 6 cm in left kidney  Chief Complaint  Patient presents with   Urethral Stricture   Benign Prostatic Hypertrophy   Hematuria    HPI: Keith Craigis a 76 y.o.  male who presents today for one year follow up.  He complains of nocturia x 2.  He only needs to void three times during the day, but he attributes this to the hot weather.  He will void more during the winter.  He is drinking two cups of coffee in the am and 4 bottles of water during day.  He has on occasion urinary hesitancy.  Patient denies any modifying or aggravating factors.  Patient denies any gross hematuria, dysuria or suprapubic/flank pain.  Patient denies any fevers, chills, nausea or vomiting.    UA yellow cloudy, +3 blood, ph 5.0. +1 protein, + 3 leuks, > 30 WBC's, 11-30 RBC's and many bacteria.   He has a small bladder diverticulum.     IPSS     Row Name 03/27/22 0900         International Prostate Symptom Score   How often have you  had the sensation of not emptying your bladder? Less than 1 in 5     How often have you had to urinate less than every two hours? Not at All     How often have you found you stopped and started again several times when you urinated? Not at All     How often have you found it difficult to postpone urination? Not at All     How often have you had a weak urinary stream? Less than half the time     How often have you had to strain to start urination? Not at All     How many times did you typically get up at night to urinate? 2 Times     Total IPSS Score 5       Quality of Life due to urinary symptoms   If you were to spend the rest of your life with your urinary condition just the way it is now how would you feel about that? Mostly Satisfied               Score:  1-7 Mild 8-19 Moderate 20-35 Severe   PMH: Past Medical History:  Diagnosis Date   Arthritis    Benign essential microscopic hematuria    BPH with obstruction/lower urinary tract symptoms    Chills    Chronic airway obstruction (HCC)    Chronic obstructive asthma (HCC)  Esophageal reflux    HTN (hypertension)    Over weight     Surgical History: Past Surgical History:  Procedure Laterality Date   cataract surgery Bilateral    CHOLECYSTECTOMY     CYSTOSCOPY W/ RETROGRADES Right 06/25/2020   Procedure: CYSTOSCOPY WITH RETROGRADE PYELOGRAM;  Surgeon: Hollice Espy, MD;  Location: [facility] OR[facility];  Service: Urology;  Laterality: Right;   CYSTOSCOPY WITH DIRECT VISION INTERNAL URETHROTOMY N/A 06/25/2020   Procedure: CYSTOSCOPY WITH DIRECT VISION INTERNAL URETHROTOMY;  Surgeon: Hollice Espy, MD;  Location: [facility];  Service: Urology;  Laterality: N/A;   CYSTOSCOPY WITH RETROGRADE URETHROGRAM N/A 06/25/2020   Procedure: CYSTOSCOPY WITH RETROGRADE URETHROGRAM;  Surgeon: Hollice Espy, MD;  Location: [facility];  Service: Urology;  Laterality: N/A;   CYSTOSCOPY WITH URETHRAL DILATATION N/A 06/25/2020   Procedure:  CYSTOSCOPY WITH URETHRAL DILATATION;  Surgeon: Hollice Espy, MD;  Location: [facility];  Service: Urology;  Laterality: N/A;   HIATAL HERNIA REPAIR     microvascular decompression of left trigeminal nerve     RHIZOTOMY     small bowel restrictions     URETHRAL STRICTURE DILATATION      Home Medications:  Allergies as of 03/27/2022       Reactions   Bactrim [sulfamethoxazole-trimethoprim] Nausea And Vomiting   Vitamin B12 [cyanocobalamin] Itching, Rash   Injection, tolerates the tablets         Medication List        Accurate as of March 27, 2022  9:30 AM. If you have any questions, ask your nurse or doctor.          albuterol 108 (90 Base) MCG/ACT inhaler Commonly known as: VENTOLIN HFA Inhale 2 puffs into the lungs every 6 (six) hours as needed for wheezing or shortness of breath.   baclofen 20 MG tablet Commonly known as: LIORESAL Take by mouth.   cloNIDine 0.2 MG tablet Commonly known as: CATAPRES Take 0.2 mg by mouth 2 (two) times daily.   finasteride 5 MG tablet Commonly known as: PROSCAR TAKE 1 TABLET BY MOUTH EVERY DAY   fluticasone 50 MCG/ACT nasal spray Commonly known as: FLONASE Place 1 spray into both nostrils daily as needed for allergies or rhinitis.   Fluticasone-Salmeterol 500-50 MCG/DOSE Aepb Commonly known as: ADVAIR Inhale 1 puff into the lungs 2 (two) times daily.   levothyroxine 25 MCG tablet Commonly known as: SYNTHROID Take 25 mcg by mouth daily.   metoprolol tartrate 25 MG tablet Commonly known as: LOPRESSOR Take 1 tablet by mouth daily.   montelukast 10 MG tablet Commonly known as: SINGULAIR Take 10 mg by mouth at bedtime.   pantoprazole 20 MG tablet Commonly known as: PROTONIX Take 20 mg by mouth 2 (two) times daily.   predniSONE 5 MG tablet Commonly known as: DELTASONE Take 5 mg by mouth daily with breakfast.   tamsulosin 0.4 MG Caps capsule Commonly known as: FLOMAX TAKE 1 CAPSULE BY MOUTH EVERY DAY   valsartan  80 MG tablet Commonly known as: DIOVAN Take 80 mg by mouth 2 (two) times daily.        Allergies:  Allergies  Allergen Reactions   Bactrim [Sulfamethoxazole-Trimethoprim] Nausea And Vomiting   Vitamin B12 [Cyanocobalamin] Itching and Rash    Injection, tolerates the tablets     Family History: Family History  Problem Relation Age of Onset   Esophagitis Mother    Arthritis/Rheumatoid Father    Kidney disease Neg Hx    Prostate cancer Neg Hx  Kidney cancer Neg Hx    Bladder Cancer Neg Hx     Social History:  reports that he quit smoking about 52 years ago. His smoking use included cigarettes. He has never used smokeless tobacco. He reports that he does not drink alcohol and does not use drugs.  ROS: Pertinent ROS in HPI  Physical Exam: BP 139/86   Pulse 69   Ht 5' 10" (1.778 m)   Wt 208 lb (94.3 kg)   BMI 29.84 kg/m   Constitutional:  Well nourished. Alert and oriented, No acute distress. HEENT: [place] AT, moist mucus membranes.  Trachea midline Cardiovascular: No clubbing, cyanosis, or edema. Respiratory: Normal respiratory effort, no increased work of breathing. Neurologic: Grossly intact, no focal deficits, moving all 4 extremities. Psychiatric: Normal mood and affect.   Laboratory Data: WBC (White Blood Cell Count) 4.1 - 10.2 10^3/uL 6.3   RBC (Red Blood Cell Count) 4.69 - 6.13 10^6/uL 4.94   Hemoglobin 14.1 - 18.1 gm/dL 14.7   Hematocrit 40.0 - 52.0 % 44.1   MCV (Mean Corpuscular Volume) 80.0 - 100.0 fl 89.3   MCH (Mean Corpuscular Hemoglobin) 27.0 - 31.2 pg 29.8   MCHC (Mean Corpuscular Hemoglobin Concentration) 32.0 - 36.0 gm/dL 33.3   Platelet Count 150 - 450 10^3/uL 214   RDW-CV (Red Cell Distribution Width) 11.6 - 14.8 % 13.9   MPV (Mean Platelet Volume) 9.4 - 12.4 fl 11.6   Neutrophils 1.50 - 7.80 10^3/uL 2.59   Lymphocytes 1.00 - 3.60 10^3/uL 2.25   Monocytes 0.00 - 1.50 10^3/uL 0.81   Eosinophils 0.00 - 0.55 10^3/uL 0.53   Basophils 0.00 - 0.09  10^3/uL 0.08   Neutrophil % 32.0 - 70.0 % 41.2   Lymphocyte % 10.0 - 50.0 % 35.9   Monocyte % 4.0 - 13.0 % 12.9   Eosinophil % 1.0 - 5.0 % 8.5 High    Basophil% 0.0 - 2.0 % 1.3   Immature Granulocyte % <=0.7 % 0.2   Immature Granulocyte Count <=0.06 10^3/L 0.01   Resulting Agency  [facility] - LAB   Specimen Collected: 12/02/21 07:29 Last Resulted: 12/02/21 08:59  Received From: [place]  Result Received: 12/13/21 09:30   Glucose 70 - 110 mg/dL 88   Sodium 136 - 145 mmol/L 142   Potassium 3.6 - 5.1 mmol/L 4.0   Chloride 97 - 109 mmol/L 106   Carbon Dioxide (CO2) 22.0 - 32.0 mmol/L 29.2   Urea Nitrogen (BUN) 7 - 25 mg/dL 23   Creatinine 0.7 - 1.3 mg/dL 1.3   Glomerular Filtration Rate (eGFR), MDRD Estimate >60 mL/min/1.73sq m 54 Low    Calcium 8.7 - 10.3 mg/dL 9.1   AST  8 - 39 U/L 11   ALT  6 - 57 U/L 11   Alk Phos (alkaline Phosphatase) 34 - 104 U/L 57   Albumin 3.5 - 4.8 g/dL 3.6   Bilirubin, Total 0.3 - 1.2 mg/dL 0.5   Protein, Total 6.1 - 7.9 g/dL 6.0 Low    A/G Ratio 1.0 - 5.0 gm/dL 1.5   Resulting Agency  [facility] - LAB   Specimen Collected: 12/02/21 07:29 Last Resulted: 12/02/21 10:45  Received From: [facility]  Result Received: 12/13/21 09:30   Thyroid Stimulating Hormone (TSH) 0.450-5.330 uIU/ml uIU/mL 5.501 High    Resulting Agency  [facility] - LAB   Specimen Collected: 12/02/21 07:29 Last Resulted: 12/02/21 10:45  Received From: [facility]  Result  Received: 12/13/21 09:30   Urinalysis See Epic and HPI I have reviewed the labs.   Pertinent Imaging:  03/27/22 09:31  Scan Result 58m   Assessment & Plan:   1. Urethral strictures -no longer cathing -reminded that urethral strictures can return it is best to cath on a intermittent basis to keep the urethra patent -states he just experiences urinary hesitancy on occasion  2. BPH with LUTS -PVR < 300 cc --continue  conservative management, avoiding bladder irritants and timed voiding's -tamsulosin 0.4 mg daily and finasteride 5 mg daily  3. High risk hematuria -work up completed in 2021 - notable for urethral strictures -non- contrast CT 2022 - right benign renal cyst -cysto 2022 - friable prostatic mucosa with bulbar urethral stricture -no reports of gross heme -UA 10-50 RBC's - sent for culture  4. Need for colonoscopy -advised him that [facility] has been trying to reach him and gave him the number to call to schedule   Return for pending PSA results .  These notes generated with voice recognition software. I apologize for typographical errors.  SZara Council PA-C  [facility] [address] [place]bon[place] [id number]([phone]

## 2022-03-27 ENCOUNTER — Encounter: Payer: Self-pay | Admitting: Urology

## 2022-03-27 ENCOUNTER — Ambulatory Visit (INDEPENDENT_AMBULATORY_CARE_PROVIDER_SITE_OTHER): Payer: Medicare Other | Admitting: Urology

## 2022-03-27 VITALS — BP 139/86 | HR 69 | Ht 70.0 in | Wt 208.0 lb

## 2022-03-27 DIAGNOSIS — N401 Enlarged prostate with lower urinary tract symptoms: Secondary | ICD-10-CM | POA: Diagnosis not present

## 2022-03-27 DIAGNOSIS — N138 Other obstructive and reflux uropathy: Secondary | ICD-10-CM | POA: Diagnosis not present

## 2022-03-27 DIAGNOSIS — R319 Hematuria, unspecified: Secondary | ICD-10-CM

## 2022-03-27 DIAGNOSIS — N35919 Unspecified urethral stricture, male, unspecified site: Secondary | ICD-10-CM

## 2022-03-27 LAB — URINALYSIS, COMPLETE
Bilirubin, UA: NEGATIVE
Glucose, UA: NEGATIVE
Ketones, UA: NEGATIVE
Nitrite, UA: NEGATIVE
Specific Gravity, UA: 1.03 (ref 1.005–1.030)
Urobilinogen, Ur: 0.2 mg/dL (ref 0.2–1.0)
pH, UA: 5 (ref 5.0–7.5)

## 2022-03-27 LAB — BLADDER SCAN AMB NON-IMAGING

## 2022-03-27 LAB — MICROSCOPIC EXAMINATION: WBC, UA: >30 /hpf — AB (ref 0–5)

## 2022-03-27 NOTE — Patient Instructions (Signed)
[facility]: [phone] 

## 2022-03-28 LAB — PSA: Prostate Specific Ag, Serum: 0.5 ng/mL (ref 0.0–4.0)

## 2022-03-31 LAB — CULTURE, URINE COMPREHENSIVE

## 2022-04-15 ENCOUNTER — Telehealth: Payer: Self-pay | Admitting: Family Medicine

## 2022-04-15 NOTE — Telephone Encounter (Signed)
Patient called and states on Sunday he was passing blood clots and it looked like pure blood coming out. Then on Monday a clot came out and his urine flow was strong. He is not seeing blood now. He did states he had done heavy lifting and was on a lawn mower for several hours. I informed him as long as the urine looks better and he is not having burning and no uti symptoms. He is to call back if he starts having blood again. Patient voiced understanding.

## 2022-04-16 NOTE — Telephone Encounter (Signed)
Notified pt as advised. Pt states at this time he is not interested in repeating these studies. He states his urine has cleared up and should he have blood again, he will call to make appt.

## 2022-04-24 ENCOUNTER — Ambulatory Visit: Payer: Medicare Other | Admitting: Gastroenterology

## 2022-05-19 IMAGING — US US ABDOMEN LIMITED
1 series · 14 of 25 positions shown · non-contrast
Comparison: 05/14/2020

CLINICAL DATA: Elevated ALT

EXAM:
ULTRASOUND ABDOMEN LIMITED RIGHT UPPER QUADRANT

[Series 1: us abdomen limited · 0.31mm/px · 14 of 25 slices shown]
[im 1/25]
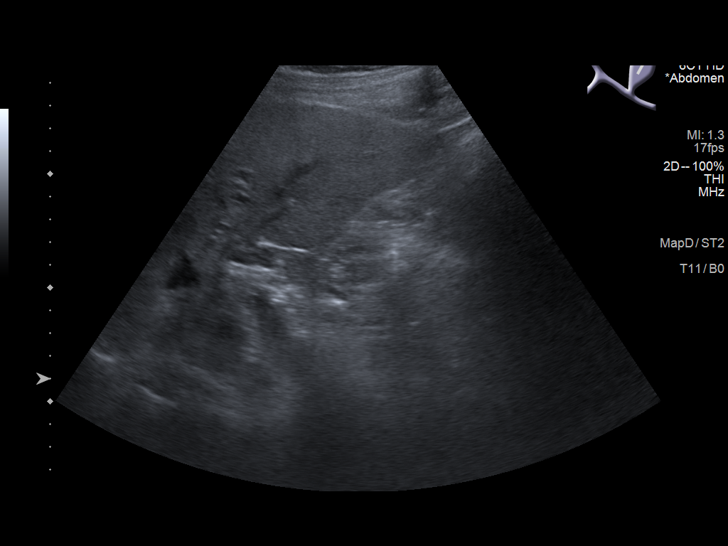
[im 3/25]
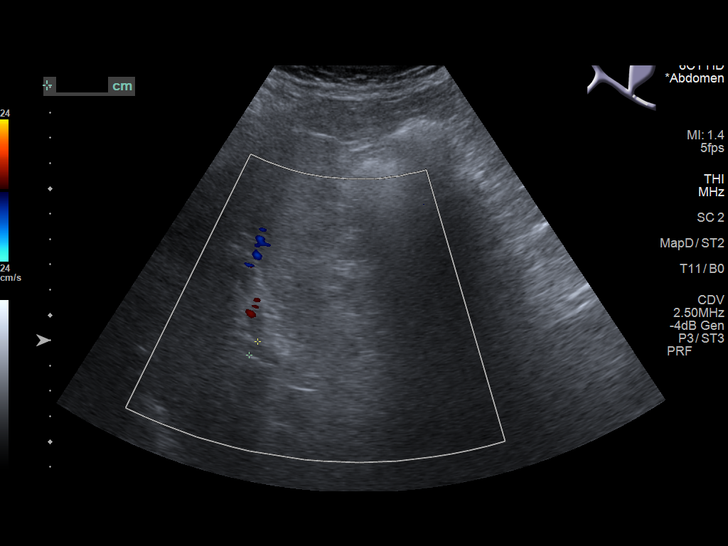
[im 5/25]
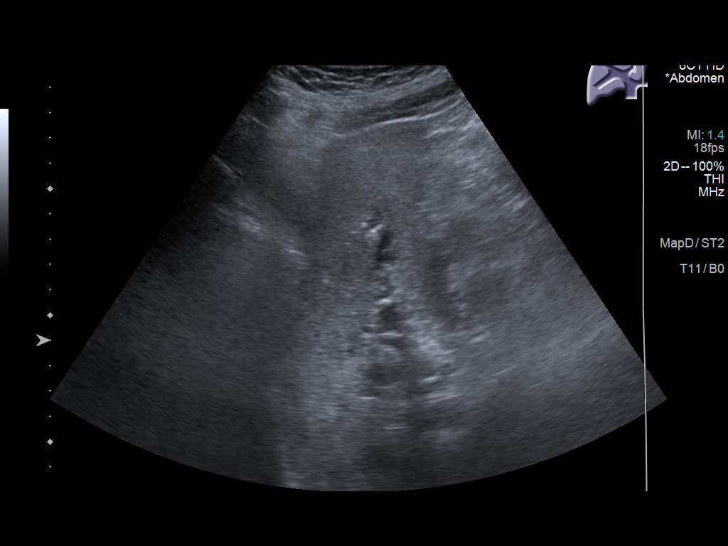
[im 7/25]
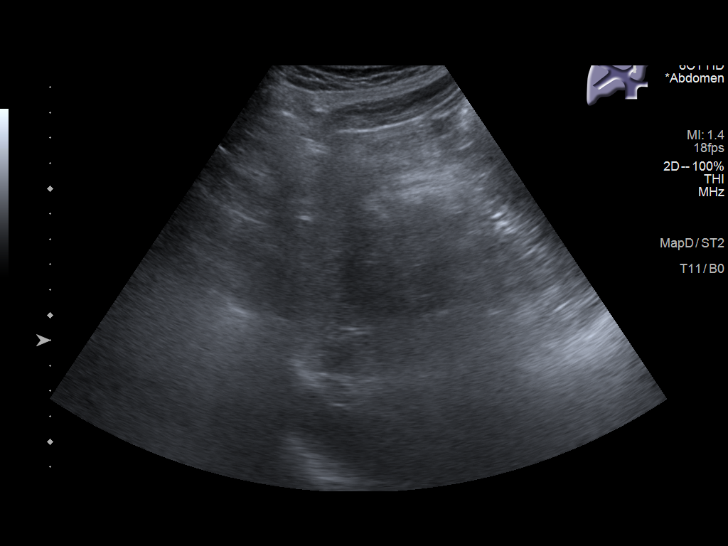
[im 9/25]
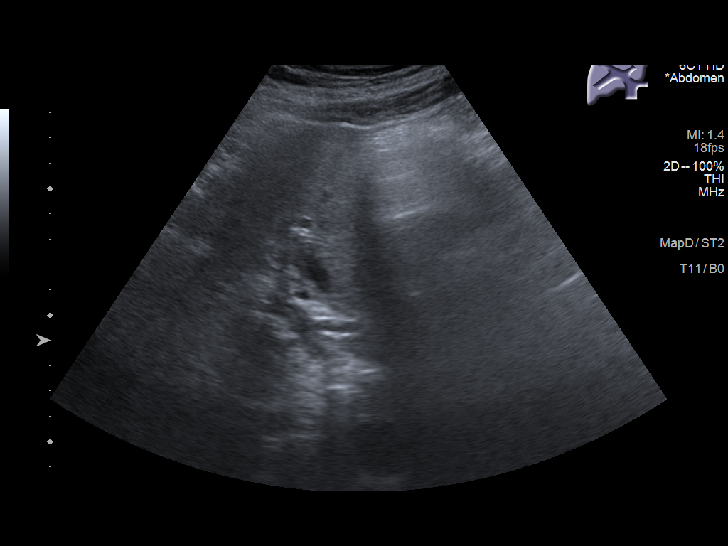
[im 10/25]
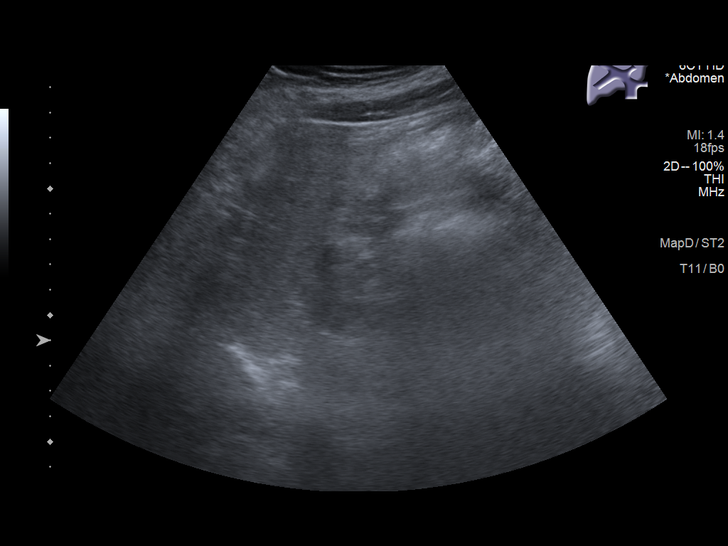
[im 12/25]
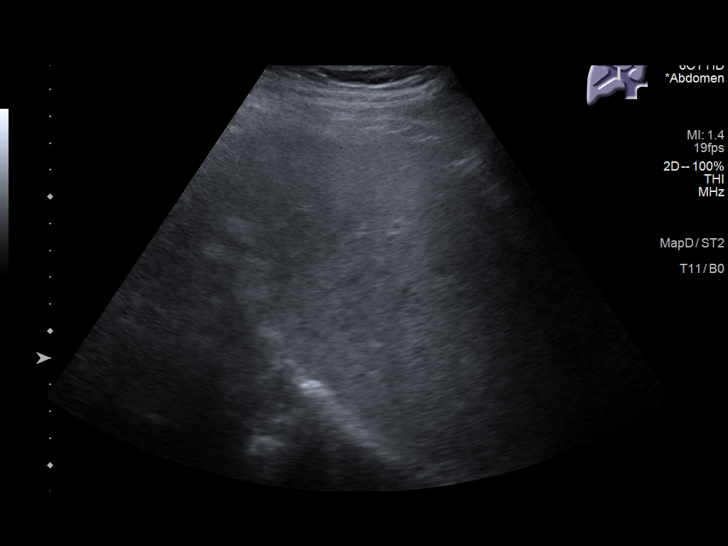
[im 14/25]
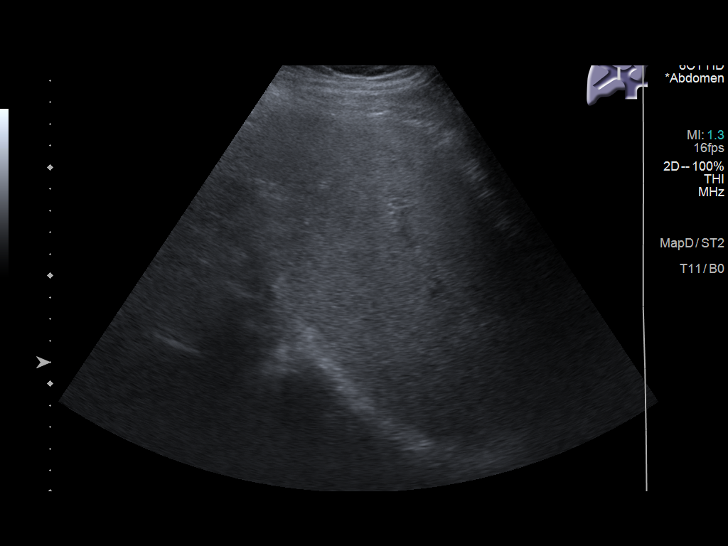
[im 16/25]
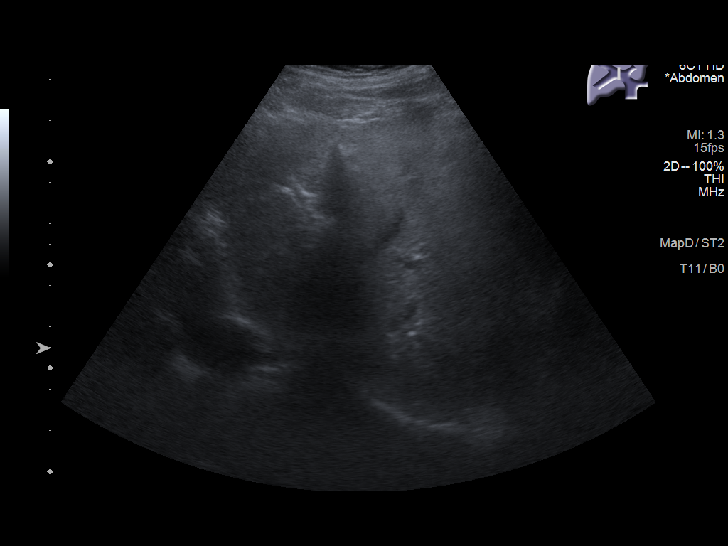
[im 17/25]
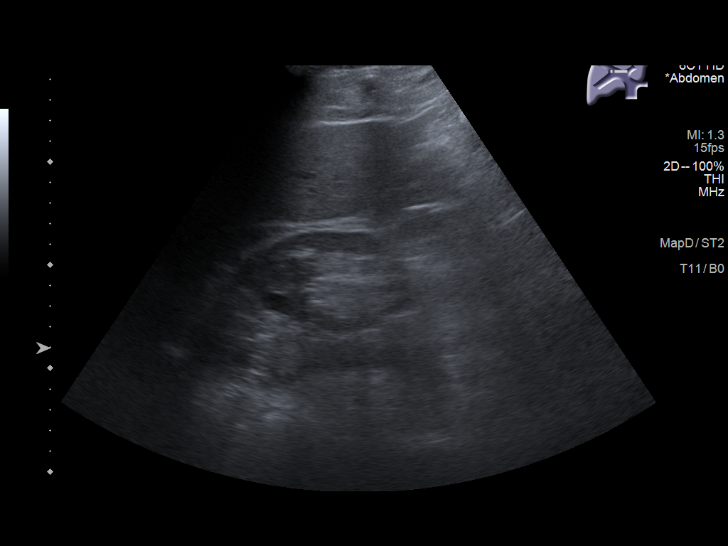
[im 19/25]
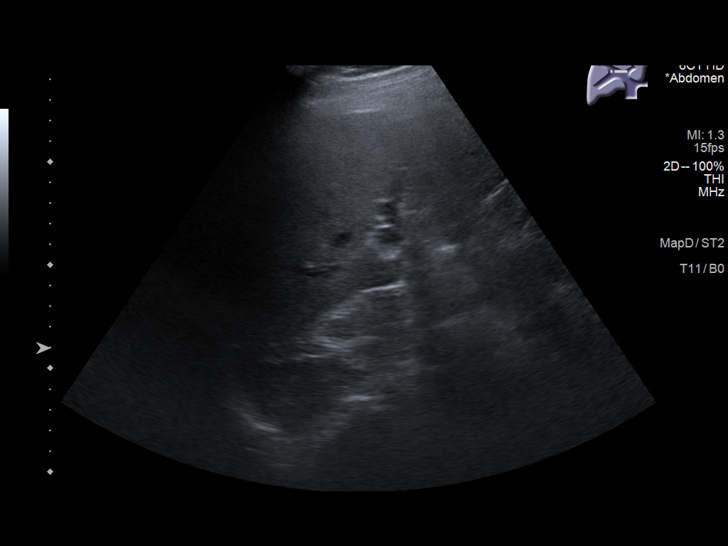
[im 21/25]
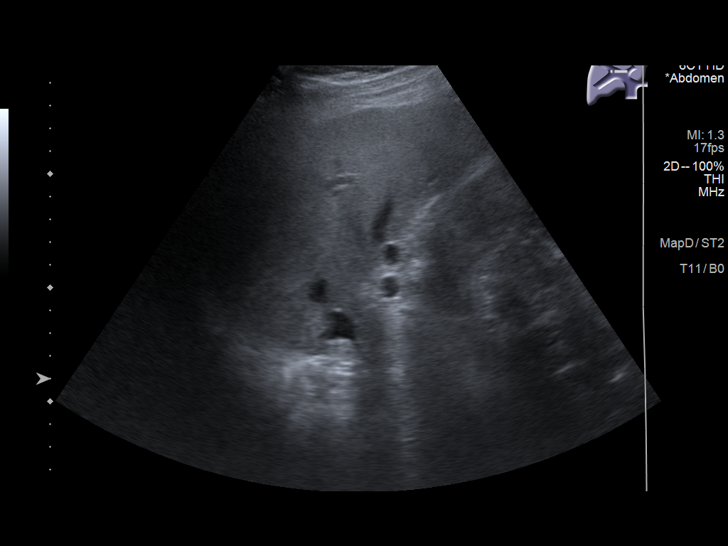
[im 23/25]
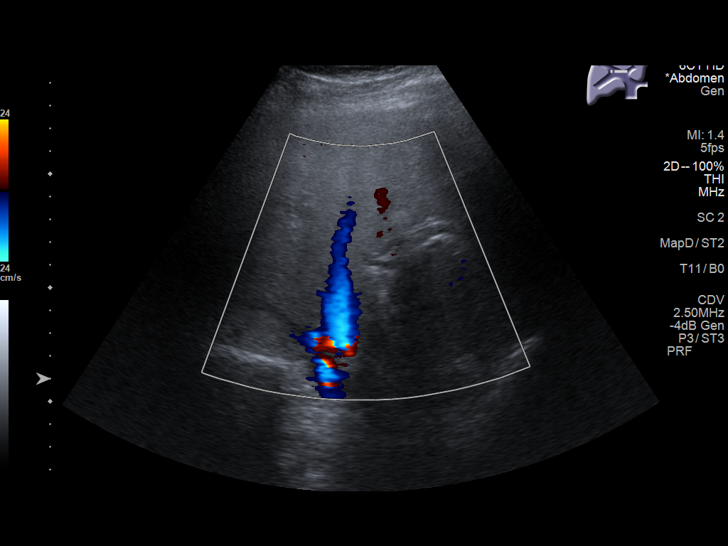
[im 25/25]
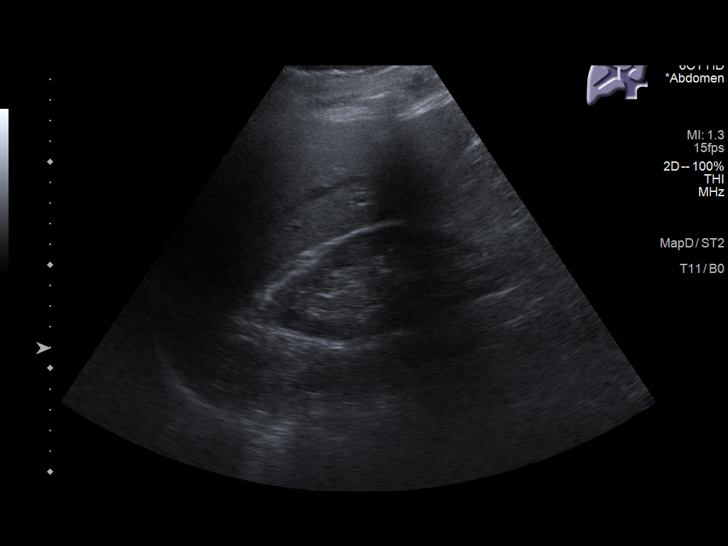

[14 of 25 positions shown; findings below may reference images not displayed]

FINDINGS: Gallbladder:

Status post cholecystectomy

Common bile duct:

Diameter: 6.3 mm

Liver:

Increased in echogenicity consistent with fatty infiltration. No
focal mass is noted. Portal vein is patent on color Doppler imaging
with normal direction of blood flow towards the liver.

Other: None.
IMPRESSION: Status post cholecystectomy.

Fatty liver.

## 2022-07-10 NOTE — Telephone Encounter (Signed)
LMOM for pt to return call and schedule appt for f/u in February.

## 2022-07-22 ENCOUNTER — Other Ambulatory Visit: Payer: Self-pay | Admitting: Urology

## 2022-07-22 DIAGNOSIS — N138 Other obstructive and reflux uropathy: Secondary | ICD-10-CM

## 2022-07-22 DIAGNOSIS — R351 Nocturia: Secondary | ICD-10-CM

## 2022-07-28 ENCOUNTER — Ambulatory Visit: Payer: Medicare Other | Admitting: Gastroenterology

## 2022-10-17 ENCOUNTER — Other Ambulatory Visit: Payer: Self-pay | Admitting: Urology

## 2022-10-17 ENCOUNTER — Telehealth: Payer: Self-pay | Admitting: Urology

## 2022-10-17 DIAGNOSIS — N138 Other obstructive and reflux uropathy: Secondary | ICD-10-CM

## 2022-10-17 NOTE — Telephone Encounter (Signed)
Error

## 2023-01-26 ENCOUNTER — Telehealth: Payer: Self-pay | Admitting: Urology

## 2023-01-26 DIAGNOSIS — N138 Other obstructive and reflux uropathy: Secondary | ICD-10-CM

## 2023-01-26 MED ORDER — FINASTERIDE 5 MG PO TABS: 5.0000 mg | ORAL_TABLET | Freq: Every day | ORAL | 0 refills | Status: DC

## 2023-01-26 NOTE — Telephone Encounter (Signed)
Pt was supposed to follow up in Feb, but didn't get message.  He is scheduled for 7/17 @ 3:30 and wants to know if he can get some Finasteride to last til then sent to [facility] in [place].

## 2023-01-26 NOTE — Telephone Encounter (Signed)
RX sent to pharmacy  

## 2023-02-24 NOTE — Progress Notes (Signed)
02/25/2023 3:59 PM   Keith Craig 13-Mar-1946 [id number]  Referring provider: Marguarite Arbour, MD [address] [facility] [place],  [place] [address]   Urological history: 1. 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 BPH with LU TS PSA (03/2022) Tamsulosin 0.4 mg daily and finasteride 5 mg daily  3. 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 No hydronephrosis. No suspicious renal cortical masses. No evidence of urothelial lesions, with limitations as described.  Mild diffuse bladder wall thickening and trabeculation with small left bladder wall diverticulum, suggesting chronic bladder outlet obstruction cysto with strictures 2021 Non contrast CT 2022 - benign renal cyst  cysto 2022 - false passages, friable prostate   4. Renal cyst non-contrast 07/2021 6 cm in left kidney  Chief Complaint Patient presents with  Follow-up   HPI: Keith Craigis a 77 y.o.  male who presents today for one year follow up.  I PSS 5/2  PVR 26 mL  UA yellow cloudy, specific gravity greater than 1.030, 1+ blood, pH 5.0, trace protein, trace leukocyte, 11-30 WBCs, 0-2 RBCs, 0-10 epithelial cells, hyaline casts are present mucus threads are present and a few bacteria.  He has no urinary complaints.  Nocturia x 2-3 which is not bothersome.  Patient denies any modifying or aggravating factors.  Patient denies any recent UTI's, gross hematuria, dysuria or suprapubic/flank pain.  Patient denies any fevers, chills, nausea or vomiting.  IPSS  Row Name 02/25/23 1500 International Prostate Symptom Score How often have you had the sensation of not emptying your bladder? Less than 1 in 5 How often have you had to urinate less than every two hours? Less than 1 in 5 times How often have you found you stopped and started again several times when you urinated? Not at All How often have  you found it difficult to postpone urination? Not at All How often have you had a weak urinary stream? Less than 1 in 5 times How often have you had to strain to start urination? Not at All How many times did you typically get up at night to urinate? 2 Times Total IPSS Score 5 Quality of Life due to urinary symptoms If you were to spend the rest of your life with your urinary condition just the way it is now how would you feel about that? Mostly Satisfied   IPSS  Row Name 02/25/23 1500 International Prostate Symptom Score How often have you had the sensation of not emptying your bladder? Less than 1 in 5 How often have you had to urinate less than every two hours? Less than 1 in 5 times How often have you found you stopped and started again several times when you urinated? Not at All How often have you found it difficult to postpone urination? Not at All How often have you had a weak urinary stream? Less than 1 in 5 times How often have you had to strain to start urination? Not at All How many times did you typically get up at night to urinate? 2 Times Total IPSS Score 5 Quality of Life due to urinary symptoms If you were to spend the rest of your life with your urinary condition just the way it is now how would you feel about that? Mostly Satisfied   IPSS  Row Name 02/25/23 1500 International Prostate Symptom Score How often have you had the sensation of not emptying  your bladder? Less than 1 in 5 How often have you had to urinate less than every two hours? Less than 1 in 5 times How often have you found you stopped and started again several times when you urinated? Not at All How often have you found it difficult to postpone urination? Not at All How often have you had a weak urinary stream? Less than 1 in 5 times How often have you had to strain to start urination? Not at All How many times did you typically get up at night to urinate? 2 Times Total IPSS  Score 5 Quality of Life due to urinary symptoms If you were to spend the rest of your life with your urinary condition just the way it is now how would you feel about that? Mostly Satisfied     Score:  1-7 Mild 8-19 Moderate 20-35 Severe   PMH: Past Medical History: Diagnosis Date  Arthritis  Benign essential microscopic hematuria  BPH with obstruction/lower urinary tract symptoms  Chills  Chronic airway obstruction (HCC)  Chronic obstructive asthma  Esophageal reflux  HTN (hypertension)  Over weight   Surgical History: Past Surgical History: Procedure Laterality Date  cataract surgery Bilateral  CHOLECYSTECTOMY  CYSTOSCOPY W/ RETROGRADES Right 06/25/2020 Procedure: CYSTOSCOPY WITH RETROGRADE PYELOGRAM;  Surgeon: Vanna Scotland, MD;  Location: [facility] ORS;  Service: Urology;  Laterality: Right;  CYSTOSCOPY WITH DIRECT VISION INTERNAL URETHROTOMY N/A 06/25/2020 Procedure: CYSTOSCOPY WITH DIRECT VISION INTERNAL URETHROTOMY;  Surgeon: Vanna Scotland, MD;  Location: [facility];  Service: Urology;  Laterality: N/A;  CYSTOSCOPY WITH RETROGRADE URETHROGRAM N/A 06/25/2020 Procedure: CYSTOSCOPY WITH RETROGRADE URETHROGRAM;  Surgeon: Vanna Scotland, MD;  Location: [facility];  Service: Urology;  Laterality: N/A;  CYSTOSCOPY WITH URETHRAL DILATATION N/A 06/25/2020 Procedure: CYSTOSCOPY WITH URETHRAL DILATATION;  Surgeon: Vanna Scotland, MD;  Location: [facility];  Service: Urology;  Laterality: N/A;  HIATAL HERNIA REPAIR  microvascular decompression of left trigeminal nerve  RHIZOTOMY  small bowel restrictions  URETHRAL STRICTURE DILATATION   Home Medications:  Allergies as of 02/25/2023     Reactions Bactrim [sulfamethoxazole-trimethoprim] Nausea And Vomiting Vitamin B12 [cyanocobalamin] Itching, Rash Injection, tolerates the tablets    Medication List   Accurate as of February 25, 2023  3:59 PM. If you have any questions, ask your nurse or  doctor.   albuterol 108 (90 Base) MCG/ACT inhaler Commonly known as: VENTOLIN HFA Inhale 2 puffs into the lungs every 6 (six) hours as needed for wheezing or shortness of breath. baclofen 20 MG tablet Commonly known as: LIORESAL Take by mouth. cloNIDine 0.2 MG tablet Commonly known as: CATAPRES Take 0.2 mg by mouth 2 (two) times daily. finasteride 5 MG tablet Commonly known as: PROSCAR Take 1 tablet (5 mg total) by mouth daily. fluticasone 50 MCG/ACT nasal spray Commonly known as: FLONASE Place 1 spray into both nostrils daily as needed for allergies or rhinitis. Fluticasone-Salmeterol 500-50 MCG/DOSE Aepb Commonly known as: ADVAIR Inhale 1 puff into the lungs 2 (two) times daily. levothyroxine 25 MCG tablet Commonly known as: SYNTHROID Take 25 mcg by mouth daily. metoprolol tartrate 25 MG tablet Commonly known as: LOPRESSOR Take 1 tablet by mouth daily. montelukast 10 MG tablet Commonly known as: SINGULAIR Take 10 mg by mouth at bedtime. pantoprazole 20 MG tablet Commonly known as: PROTONIX Take 20 mg by mouth 2 (two) times daily. predniSONE 5 MG tablet Commonly known as: DELTASONE Take 5 mg by mouth daily with breakfast. tamsulosin 0.4 MG Caps capsule Commonly known as: FLOMAX TAKE 1 CAPSULE  BY MOUTH EVERY DAY valsartan 80 MG tablet Commonly known as: DIOVAN Take 80 mg by mouth 2 (two) times daily.    Allergies:  Allergies Allergen Reactions  Bactrim [Sulfamethoxazole-Trimethoprim] Nausea And Vomiting  Vitamin B12 [Cyanocobalamin] Itching and Rash Injection, tolerates the tablets    Family History: Family History Problem Relation Age of Onset  Esophagitis Mother  Arthritis/Rheumatoid Father  Kidney disease Neg Hx  Prostate cancer Neg Hx  Kidney cancer Neg Hx  Bladder Cancer Neg Hx   Social History:  reports that he quit smoking about 53 years ago. His smoking use included cigarettes. He has never used smokeless tobacco. He reports  that he does not drink alcohol and does not use drugs.  ROS: Pertinent ROS in HPI  Physical Exam: BP 123/81   Pulse 67   Wt 208 lb (94.3 kg)   BMI 29.84 kg/m   Constitutional:  Well nourished. Alert and oriented, No acute distress. HEENT: [place] AT, moist mucus membranes.  Trachea midline Cardiovascular: No clubbing, cyanosis, or edema. Respiratory: Normal respiratory effort, no increased work of breathing. Neurologic: Grossly intact, no focal deficits, moving all 4 extremities. Psychiatric: Normal mood and affect.   Laboratory Data: Comprehensive Metabolic Panel (CMP) Order: [id number] Component Ref Range & Units 3 mo ago Glucose 70 - 110 mg/dL 91 Sodium 865 - 784 mmol/L 141 Potassium 3.6 - 5.1 mmol/L 3.9 Chloride 97 - 109 mmol/L 107 Carbon Dioxide (CO2) 22.0 - 32.0 mmol/L 27.6 Urea Nitrogen (BUN) 7 - 25 mg/dL 16 Creatinine 0.7 - 1.3 mg/dL 1.3 Glomerular Filtration Rate (eGFR) >60 mL/min/1.73sq m 57 Low  Comment: CKD-EPI (2021) does not include patient's race in the calculation of eGFR.  Monitoring changes of plasma creatinine and eGFR over time is useful for monitoring kidney function.  Interpretive Ranges for eGFR (CKD-EPI 2021):  eGFR:       >60 mL/min/1.73 sq. m - Normal eGFR:       30-59 mL/min/1.73 sq. m - Moderately Decreased eGFR:       15-29 mL/min/1.73 sq. m  - Severely Decreased eGFR:       < 15 mL/min/1.73 sq. m  - Kidney Failure   Note: These eGFR calculations do not apply in acute situations when eGFR is changing rapidly or patients on dialysis. Calcium 8.7 - 10.3 mg/dL 9.0 AST 8 - 39 U/L 12 ALT 6 - 57 U/L 9 Alk Phos (alkaline Phosphatase) 34 - 104 U/L 59 Albumin 3.5 - 4.8 g/dL 3.7 Bilirubin, Total 0.3 - 1.2 mg/dL 0.6 Protein, Total 6.1 - 7.9 g/dL 6.1 A/G Ratio 1.0 - 5.0 gm/dL 1.5 Resulting Agency [facility]  Specimen Collected: 11/24/22 08:34 Performed by: [facility] Last Resulted:  11/24/22 12:52 Received From: [facility]  Result Received: 12/05/22 11:07  ontains abnormal data Thyroid Stimulating-Hormone (TSH) Order: [id number] Component Ref Range & Units 3 mo ago Thyroid Stimulating Hormone (TSH) 0.450-5.330 uIU/ml uIU/mL 5.889 High  Resulting Agency [facility]  Specimen Collected: 11/24/22 08:34 Performed by: [facility] Last Resulted: 11/24/22 12:58 Received From: [facility]  Result Received: 12/05/22 11:07  Hemoglobin A1C Order: [id number] Component Ref Range & Units 3 mo ago Hemoglobin A1C 4.2 - 5.6 % 5.9 High  Average Blood Glucose (Calc) mg/dL 725 Resulting Agency [facility] Narrative Performed by [facility] Normal Range:    4.2 - 5.6% Increased Risk:  5.7 - 6.4% Diabetes:        >=  6.5% Glycemic Control for adults with diabetes:  <7%    Specimen Collected: 11/24/22 08:34 Performed by: [facility] Last Resulted: 11/24/22 10:34 Received From: [facility]  Result Received: 12/05/22 11:07  Urinalysis Results for orders placed or performed in visit on 02/25/23  Microscopic Examination   Urine  Result Value Ref Range   WBC, UA 11-30 (A) 0 - 5 /hpf   RBC, Urine 0-2 0 - 2 /hpf   Epithelial Cells (non renal) 0-10 0 - 10 /hpf   Casts Present (A) None seen /lpf   Cast Type Hyaline casts N/A   Mucus, UA Present (A) Not Estab.   Bacteria, UA Few None seen/Few  Urinalysis, Complete  Result Value Ref Range   Specific Gravity, UA >1.030 (H) 1.005 - 1.030   pH, UA 5.0 5.0 - 7.5   Color, UA Yellow Yellow   Appearance Ur Cloudy (A) Clear   Leukocytes,UA Trace (A) Negative   Protein,UA Trace Negative/Trace   Glucose, UA Negative Negative   Ketones, UA Negative Negative   RBC, UA 1+ (A) Negative   Bilirubin, UA Negative Negative   Urobilinogen, Ur 0.2 0.2 - 1.0 mg/dL   Nitrite, UA Negative Negative    Microscopic Examination See below:   Bladder Scan (Post Void Residual) in office  Result Value Ref Range   Scan Result 26ml     I have reviewed the labs.   Pertinent Imaging: 02/25/23 15:32 Scan Result 26ml   Assessment & Plan:   1. Urethral strictures -He is not having any symptoms of spraying of the urinary stream, weakening of the urinary stream or straining to urinate -PVR demonstrates adequate emptying  2. BPH with LUTS -PVR demonstrates adequate emptying --continue conservative management, avoiding bladder irritants and timed voiding's -tamsulosin 0.4 mg daily and finasteride 5 mg daily  3. 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  These notes generated with voice recognition software. I apologize for typographical errors.  Cloretta Ned  [facility] [address]  [address] [place], [place] [address] [phone]

## 2023-02-25 ENCOUNTER — Encounter: Payer: Self-pay | Admitting: Urology

## 2023-02-25 ENCOUNTER — Ambulatory Visit (INDEPENDENT_AMBULATORY_CARE_PROVIDER_SITE_OTHER): Payer: Medicare Other | Admitting: Urology

## 2023-02-25 VITALS — BP 123/81 | HR 67 | Wt 208.0 lb

## 2023-02-25 DIAGNOSIS — R351 Nocturia: Secondary | ICD-10-CM

## 2023-02-25 DIAGNOSIS — Z87448 Personal history of other diseases of urinary system: Secondary | ICD-10-CM | POA: Diagnosis not present

## 2023-02-25 DIAGNOSIS — R319 Hematuria, unspecified: Secondary | ICD-10-CM

## 2023-02-25 DIAGNOSIS — N401 Enlarged prostate with lower urinary tract symptoms: Secondary | ICD-10-CM

## 2023-02-25 DIAGNOSIS — N138 Other obstructive and reflux uropathy: Secondary | ICD-10-CM

## 2023-02-25 DIAGNOSIS — N35919 Unspecified urethral stricture, male, unspecified site: Secondary | ICD-10-CM

## 2023-02-25 LAB — URINALYSIS, COMPLETE
Bilirubin, UA: NEGATIVE
Glucose, UA: NEGATIVE
Ketones, UA: NEGATIVE
Nitrite, UA: NEGATIVE
Specific Gravity, UA: >1.03 — ABNORMAL HIGH (ref 1.005–1.030)
Urobilinogen, Ur: 0.2 mg/dL (ref 0.2–1.0)
pH, UA: 5 (ref 5.0–7.5)

## 2023-02-25 LAB — MICROSCOPIC EXAMINATION

## 2023-02-25 LAB — BLADDER SCAN AMB NON-IMAGING

## 2023-02-25 MED ORDER — TAMSULOSIN HCL 0.4 MG PO CAPS
0.4000 mg | ORAL_CAPSULE | Freq: Every day | ORAL | 3 refills | Status: DC
Start: 2023-02-25 — End: 2024-04-01

## 2023-02-25 MED ORDER — FINASTERIDE 5 MG PO TABS: 5.0000 mg | ORAL_TABLET | Freq: Every day | ORAL | 3 refills | Status: DC

## 2023-03-24 ENCOUNTER — Inpatient Hospital Stay
Admission: EM | Admit: 2023-03-24 | Discharge: 2023-03-26 | DRG: 322 | Disposition: A | Discharge status: Home / Self Care | Payer: Medicare Other | Attending: Student | Admitting: Student

## 2023-03-24 DIAGNOSIS — Z7901 Long term (current) use of anticoagulants: Secondary | ICD-10-CM

## 2023-03-24 DIAGNOSIS — Z7951 Long term (current) use of inhaled steroids: Secondary | ICD-10-CM

## 2023-03-24 DIAGNOSIS — R079 Chest pain, unspecified: Secondary | ICD-10-CM

## 2023-03-24 DIAGNOSIS — E039 Hypothyroidism, unspecified: Secondary | ICD-10-CM

## 2023-03-24 DIAGNOSIS — J4489 Other specified chronic obstructive pulmonary disease: Secondary | ICD-10-CM | POA: Diagnosis present

## 2023-03-24 DIAGNOSIS — E785 Hyperlipidemia, unspecified: Secondary | ICD-10-CM | POA: Diagnosis present

## 2023-03-24 DIAGNOSIS — I2510 Atherosclerotic heart disease of native coronary artery without angina pectoris: Secondary | ICD-10-CM | POA: Diagnosis present

## 2023-03-24 DIAGNOSIS — I10 Essential (primary) hypertension: Secondary | ICD-10-CM

## 2023-03-24 DIAGNOSIS — N138 Other obstructive and reflux uropathy: Secondary | ICD-10-CM | POA: Diagnosis present

## 2023-03-24 DIAGNOSIS — I482 Chronic atrial fibrillation, unspecified: Secondary | ICD-10-CM

## 2023-03-24 DIAGNOSIS — J449 Chronic obstructive pulmonary disease, unspecified: Secondary | ICD-10-CM

## 2023-03-24 DIAGNOSIS — I4891 Unspecified atrial fibrillation: Secondary | ICD-10-CM

## 2023-03-24 DIAGNOSIS — N401 Enlarged prostate with lower urinary tract symptoms: Secondary | ICD-10-CM | POA: Diagnosis present

## 2023-03-24 DIAGNOSIS — I214 Non-ST elevation (NSTEMI) myocardial infarction: Principal | ICD-10-CM | POA: Diagnosis present

## 2023-03-24 DIAGNOSIS — Z8489 Family history of other specified conditions: Secondary | ICD-10-CM

## 2023-03-24 DIAGNOSIS — K219 Gastro-esophageal reflux disease without esophagitis: Secondary | ICD-10-CM | POA: Diagnosis present

## 2023-03-24 DIAGNOSIS — Z880 Allergy status to penicillin: Secondary | ICD-10-CM

## 2023-03-24 DIAGNOSIS — Z888 Allergy status to other drugs, medicaments and biological substances status: Secondary | ICD-10-CM

## 2023-03-24 DIAGNOSIS — Z79890 Hormone replacement therapy (postmenopausal): Secondary | ICD-10-CM

## 2023-03-24 DIAGNOSIS — Z87891 Personal history of nicotine dependence: Secondary | ICD-10-CM

## 2023-03-24 DIAGNOSIS — Z79899 Other long term (current) drug therapy: Secondary | ICD-10-CM

## 2023-03-24 DIAGNOSIS — Z8261 Family history of arthritis: Secondary | ICD-10-CM

## 2023-03-24 NOTE — ED Triage Notes (Signed)
Pt BIB EMS for CP that started 1700 that gradually became worse and then radiation to L arm. Pt has associated symptoms of SOB. EMS found the patient to be in a-fib with highest rate 140's and lowest rate 55.

## 2023-03-24 NOTE — ED Provider Notes (Incomplete)
[facility] Provider Note    Event Date/Time   First MD Initiated Contact with Patient 03/24/23 2342     (approximate)   History   Chest Pain   HPI  Keith Craig. is a 77 y.o. male with history of COPD, previous tobacco use, hypertension, prediabetes, hyperlipidemia who presents to the emergency department with chest pain in the center of his chest without radiation, shortness of breath, nausea, diaphoresis.  Symptoms worse with exertion.  Found to be in A-fib, rates between 55 - 140s with EMS.  No history of the same.  No calf tenderness or calf swelling.  Last cardiac catheterization was in 2009.  His cardiologist is Dr. Lady Gary.   History provided by patient and wife.    Past Medical History:  Diagnosis Date   Arthritis    Benign essential microscopic hematuria    BPH with obstruction/lower urinary tract symptoms    Chills    Chronic airway obstruction (HCC)    Chronic obstructive asthma    Esophageal reflux    HTN (hypertension)    Over weight     Past Surgical History:  Procedure Laterality Date   cataract surgery Bilateral    CHOLECYSTECTOMY     CYSTOSCOPY W/ RETROGRADES Right 06/25/2020   Procedure: CYSTOSCOPY WITH RETROGRADE PYELOGRAM;  Surgeon: Vanna Scotland, MD;  Location: [facility] OR[facility];  Service: Urology;  Laterality: Right;   CYSTOSCOPY WITH DIRECT VISION INTERNAL URETHROTOMY N/A 06/25/2020   Procedure: CYSTOSCOPY WITH DIRECT VISION INTERNAL URETHROTOMY;  Surgeon: Vanna Scotland, MD;  Location: [facility];  Service: Urology;  Laterality: N/A;   CYSTOSCOPY WITH RETROGRADE URETHROGRAM N/A 06/25/2020   Procedure: CYSTOSCOPY WITH RETROGRADE URETHROGRAM;  Surgeon: Vanna Scotland, MD;  Location: [facility];  Service: Urology;  Laterality: N/A;   CYSTOSCOPY WITH URETHRAL DILATATION N/A 06/25/2020   Procedure: CYSTOSCOPY WITH URETHRAL DILATATION;  Surgeon: Vanna Scotland, MD;  Location: [facility];  Service: Urology;  Laterality: N/A;    HIATAL HERNIA REPAIR     microvascular decompression of left trigeminal nerve     RHIZOTOMY     small bowel restrictions     URETHRAL STRICTURE DILATATION      MEDICATIONS:  Prior to Admission medications   Medication Sig Start Date End Date Taking? Authorizing Provider  albuterol (PROVENTIL HFA;VENTOLIN HFA) 108 (90 BASE) MCG/ACT inhaler Inhale 2 puffs into the lungs every 6 (six) hours as needed for wheezing or shortness of breath.    [provider]  baclofen (LIORESAL) 20 MG tablet Take by mouth. 09/10/21   [provider]  cloNIDine (CATAPRES) 0.2 MG tablet Take 0.2 mg by mouth 2 (two) times daily.    [provider]  finasteride (PROSCAR) 5 MG tablet Take 1 tablet (5 mg total) by mouth daily. 02/25/23   McGowan, Carollee Herter A, PA-C  fluticasone (FLONASE) 50 MCG/ACT nasal spray Place 1 spray into both nostrils daily as needed for allergies or rhinitis.    [provider]  Fluticasone-Salmeterol (ADVAIR) 500-50 MCG/DOSE AEPB Inhale 1 puff into the lungs 2 (two) times daily.    [provider]  levothyroxine (SYNTHROID) 25 MCG tablet Take 25 mcg by mouth daily. 03/24/22   [provider]  metoprolol tartrate (LOPRESSOR) 25 MG tablet Take 1 tablet by mouth daily. 07/20/14   [provider]  montelukast (SINGULAIR) 10 MG tablet Take 10 mg by mouth at bedtime.     [provider]  pantoprazole (PROTONIX) 20 MG tablet Take 20 mg by  mouth 2 (two) times daily.    [provider]  predniSONE (DELTASONE) 5 MG tablet Take 5 mg by mouth daily with breakfast.    [provider]  tamsulosin (FLOMAX) 0.4 MG CAPS capsule Take 1 capsule (0.4 mg total) by mouth daily. 02/25/23   Michiel Cowboy A, PA-C  valsartan (DIOVAN) 80 MG tablet Take 80 mg by mouth 2 (two) times daily.     [provider]    Physical Exam   Triage Vital Signs: ED Triage Vitals  Encounter Vitals Group     BP 03/24/23 2343 (!) 155/93      Systolic BP Percentile --      Diastolic BP Percentile --      Pulse Rate 03/24/23 2343 75     Resp 03/24/23 2343 14     Temp 03/24/23 2343 97.7 F (36.5 C)     Temp Source 03/24/23 2343 Oral     SpO2 03/24/23 2341 98 %     Weight 03/24/23 2345 210 lb (95.3 kg)     Height 03/24/23 2345 6' (1.829 m)     Head Circumference --      Peak Flow --      Pain Score --      Pain Loc --      Pain Education --      Exclude from Growth Chart --     Most recent vital signs: Vitals:   03/24/23 2343 03/25/23 0100  BP: (!) 155/93 (!) 137/101  Pulse: 75 (!) 115  Resp: 14 12  Temp: 97.7 F (36.5 C)   SpO2: 100% 100%    CONSTITUTIONAL: Alert, responds appropriately to questions.  Elderly, appears uncomfortable HEAD: Normocephalic, atraumatic EYES: Conjunctivae clear, pupils appear equal, sclera nonicteric ENT: normal nose; moist mucous membranes NECK: Supple, normal ROM CARD: Regularly irregular; S1 and S2 appreciated, chest wall nontender RESP: Normal chest excursion without splinting or tachypnea; breath sounds clear and equal bilaterally; no wheezes, no rhonchi, no rales, no hypoxia or respiratory distress, speaking full sentences ABD/GI: Non-distended; soft, non-tender, no rebound, no guarding, no peritoneal signs BACK: The back appears normal EXT: Normal ROM in all joints; no deformity noted, no edema, no calf tenderness or calf swelling SKIN: Normal color for age and race; warm; no rash on exposed skin NEURO: Moves all extremities equally, normal speech PSYCH: The patient's mood and manner are appropriate.   ED Results / Procedures / Treatments   LABS: (all labs ordered are listed, but only abnormal results are displayed) Labs Reviewed  BASIC METABOLIC PANEL - Abnormal; Notable for the following components:      Result Value   Glucose, Bld 109 (*)    Creatinine, Ser 1.25 (*)    Calcium 8.4 (*)    GFR, Estimated 60 (*)    All other components within normal limits  TSH -  Abnormal; Notable for the following components:   TSH 4.839 (*)    All other components within normal limits  TROPONIN I (HIGH SENSITIVITY) - Abnormal; Notable for the following components:   Troponin I (High Sensitivity) 497 (*)    All other components within normal limits  CBC  D-DIMER, QUANTITATIVE  MAGNESIUM  T4, FREE  APTT  PROTIME-INR     EKG:  EKG Interpretation Date/Time:  Tuesday March 24 2023 23:43:37 EDT Ventricular Rate:  88 PR Interval:    QRS Duration:  112 QT Interval:  352 QTC Calculation: 426 R Axis:   -32  Text Interpretation: Atrial  fibrillation Abnormal R-wave progression, early transition LVH with secondary repolarization abnormality Confirmed by Rochele Raring [phone]) on 03/24/2023 11:45:01 PM         EKG Interpretation Date/Time:  Wednesday March 25 2023 01:11:39 EDT Ventricular Rate:  89 PR Interval:    QRS Duration:  110 QT Interval:  344 QTC Calculation: 419 R Axis:   -34  Text Interpretation: Atrial fibrillation Abnormal R-wave progression, early transition LVH with IVCD and secondary repol abnrm Confirmed by Rochele Raring [phone]) on 03/25/2023 1:28:12 AM        EKG Interpretation Date/Time:  Wednesday March 25 2023 01:16:06 EDT Ventricular Rate:  71 PR Interval:    QRS Duration:  114 QT Interval:  413 QTC Calculation: 449 R Axis:   -40  Text Interpretation: Atrial fibrillation LVH with IVCD, LAD and secondary repol abnrm Consider posterior infarct Confirmed by Rochele Raring [phone]) on 03/25/2023 1:33:12 AM         EKG Interpretation Date/Time:  Wednesday March 25 2023 02:34:05 EDT Ventricular Rate:  79 PR Interval:  159 QRS Duration:  107 QT Interval:  314 QTC Calculation: 360 R Axis:   -37  Text Interpretation: Sinus rhythm LVH with secondary repolarization abnormality Consider posterior infarct No significant change from EKG in 2018 Reconfirmed by Rochele Raring [phone]) on 03/25/2023 2:38:08 AM           RADIOLOGY: My personal review and interpretation of imaging: X-ray clear.  I have personally reviewed all radiology reports.   DG Chest Port 1 View  Result Date: 03/25/2023 CLINICAL DATA:  Chest pain, shortness of breath EXAM: PORTABLE CHEST 1 VIEW COMPARISON:  CT chest dated 10/10/2020 FINDINGS: Lungs are clear.  No pleural effusion or pneumothorax. The heart is normal in size.  Thoracic aortic atherosclerosis. IMPRESSION: No acute cardiopulmonary disease. Electronically Signed   By: Charline Bills M.D.   On: 03/25/2023 00:34     PROCEDURES:  Critical Care performed: Yes, see critical care procedure note(s)   CRITICAL CARE Performed by: Baxter Hire    Total critical care time: 40 minutes  Critical care time was exclusive of separately billable procedures and treating other patients.  Critical care was necessary to treat or prevent imminent or life-threatening deterioration.  Critical care was time spent personally by me on the following activities: development of treatment plan with patient and/or surrogate as well as nursing, discussions with consultants, evaluation of patient's response to treatment, examination of patient, obtaining history from patient or surrogate, ordering and performing treatments and interventions, ordering and review of laboratory studies, ordering and review of radiographic studies, pulse oximetry and re-evaluation of patient's condition.   Marland Kitchen1-3 Lead EKG Interpretation  Performed by: , Layla Maw, DO Authorized by: , Layla Maw, DO     Interpretation: abnormal     ECG rate:  76   ECG rate assessment: normal     Rhythm: atrial fibrillation     Ectopy: none     Conduction: normal       IMPRESSION / MDM / ASSESSMENT AND PLAN / ED COURSE  I reviewed the triage vital signs and the nursing notes.    Patient here with chest pain.  Multiple risk factors for ACS.  Also in new onset A-fib.  Currently rate controlled but was in RVR  with EMS.  The patient is on the cardiac monitor to evaluate for evidence of arrhythmia and/or significant heart rate changes.   DIFFERENTIAL DIAGNOSIS (includes but not limited to):   ACS, new onset A-fib,  PE, CHF, doubt dissection, no symptoms of pneumonia   Patient's presentation is most consistent with acute presentation with potential threat to life or bodily function.   PLAN: Will obtain cardiac labs, chest x-ray.  Patient has had aspirin at home.  Will give nitroglycerin.  Anticipate admission.   MEDICATIONS GIVEN IN ED: Medications  aspirin chewable tablet 324 mg (324 mg Oral Not Given 03/25/23 0015)  sodium chloride 0.9 % bolus 500 mL (500 mLs Intravenous New Bag/Given 03/25/23 0127)  heparin bolus via infusion 4,000 Units (has no administration in time range)  heparin ADULT infusion 100 units/mL (25000 units/265mL) (1,300 Units/hr Intravenous New Bag/Given 03/25/23 0133)  atropine 1 MG/10ML injection 1 mg (has no administration in time range)  atropine 1 MG/10ML injection (1 mg  Given 03/25/23 0125)     ED COURSE: Labs show no leukocytosis, normal hemoglobin, slightly elevated creatinine at 1.25.  Troponin is 497.  Will start heparin.  Negative D-dimer.  Chest x-ray reviewed and interpreted by myself and the radiologist and shows no infiltrate, edema, widened mediastinum or cardiomegaly.  Will discuss with hospitalist for admission.   1:27 AM  Pt received 1 nitroglycerin tablet for continued chest discomfort.  Patient had a drop in his blood pressure into the 80s systolic and heart rate dropped into the 30s still in A-fib.  Patient states he felt "drowsy" and "dizzy".  Patient was given IV fluids and 1 mg of IV atropine and heart rate returned into the 80s in A-fib and blood pressure improved to the 100s systolic.  Will continue to monitor and avoid any further nitroglycerin.  2:37 AM  Pt converted back into a sinus rhythm with rates in the 70s.  CONSULTS:  Consulted and  discussed patient's case with hospitalist, Dr. Arville Care.  I have recommended admission and consulting physician agrees and will place admission orders.  Patient (and family if present) agree with this plan.   I reviewed all nursing notes, vitals, pertinent previous records.  All labs, EKGs, imaging ordered have been independently reviewed and interpreted by myself.    OUTSIDE RECORDS REVIEWED: Reviewed recent internal medicine notes.       FINAL CLINICAL IMPRESSION(S) / ED DIAGNOSES   Final diagnoses:  NSTEMI (non-ST elevated myocardial infarction) (HCC)  New onset atrial fibrillation (HCC)     Rx / DC Orders   ED Discharge Orders     None        Note:  This document was prepared using Dragon voice recognition software and may include unintentional dictation errors.       , Layla Maw, DO 03/25/23 0133    , Layla Maw, DO 03/25/23 [phone]

## 2023-03-25 ENCOUNTER — Other Ambulatory Visit: Payer: Self-pay

## 2023-03-25 ENCOUNTER — Encounter
Admission: EM | Disposition: A | Discharge status: Home / Self Care | Payer: Self-pay | Source: Home / Self Care | Attending: Student

## 2023-03-25 ENCOUNTER — Emergency Department: Payer: Medicare Other

## 2023-03-25 ENCOUNTER — Inpatient Hospital Stay
Admit: 2023-03-25 | Discharge: 2023-03-25 | Disposition: A | Discharge status: Home / Self Care | Payer: Medicare Other | Attending: Family Medicine | Admitting: Family Medicine

## 2023-03-25 ENCOUNTER — Encounter: Payer: Self-pay | Admitting: Family Medicine

## 2023-03-25 ENCOUNTER — Other Ambulatory Visit: Payer: Self-pay | Admitting: Internal Medicine

## 2023-03-25 DIAGNOSIS — Z87891 Personal history of nicotine dependence: Secondary | ICD-10-CM | POA: Diagnosis not present

## 2023-03-25 DIAGNOSIS — J4489 Other specified chronic obstructive pulmonary disease: Secondary | ICD-10-CM | POA: Diagnosis present

## 2023-03-25 DIAGNOSIS — N138 Other obstructive and reflux uropathy: Secondary | ICD-10-CM | POA: Diagnosis present

## 2023-03-25 DIAGNOSIS — I10 Essential (primary) hypertension: Secondary | ICD-10-CM | POA: Diagnosis present

## 2023-03-25 DIAGNOSIS — I214 Non-ST elevation (NSTEMI) myocardial infarction: Secondary | ICD-10-CM | POA: Diagnosis present

## 2023-03-25 DIAGNOSIS — I4891 Unspecified atrial fibrillation: Secondary | ICD-10-CM

## 2023-03-25 DIAGNOSIS — Z79899 Other long term (current) drug therapy: Secondary | ICD-10-CM | POA: Diagnosis not present

## 2023-03-25 DIAGNOSIS — K219 Gastro-esophageal reflux disease without esophagitis: Secondary | ICD-10-CM | POA: Diagnosis present

## 2023-03-25 DIAGNOSIS — Z880 Allergy status to penicillin: Secondary | ICD-10-CM | POA: Diagnosis not present

## 2023-03-25 DIAGNOSIS — I482 Chronic atrial fibrillation, unspecified: Secondary | ICD-10-CM

## 2023-03-25 DIAGNOSIS — Z7951 Long term (current) use of inhaled steroids: Secondary | ICD-10-CM | POA: Diagnosis not present

## 2023-03-25 DIAGNOSIS — Z79890 Hormone replacement therapy (postmenopausal): Secondary | ICD-10-CM | POA: Diagnosis not present

## 2023-03-25 DIAGNOSIS — N401 Enlarged prostate with lower urinary tract symptoms: Secondary | ICD-10-CM

## 2023-03-25 DIAGNOSIS — Z888 Allergy status to other drugs, medicaments and biological substances status: Secondary | ICD-10-CM | POA: Diagnosis not present

## 2023-03-25 DIAGNOSIS — I2510 Atherosclerotic heart disease of native coronary artery without angina pectoris: Secondary | ICD-10-CM | POA: Diagnosis present

## 2023-03-25 DIAGNOSIS — E039 Hypothyroidism, unspecified: Secondary | ICD-10-CM | POA: Diagnosis present

## 2023-03-25 DIAGNOSIS — E785 Hyperlipidemia, unspecified: Secondary | ICD-10-CM | POA: Diagnosis present

## 2023-03-25 DIAGNOSIS — Z8261 Family history of arthritis: Secondary | ICD-10-CM | POA: Diagnosis not present

## 2023-03-25 DIAGNOSIS — J449 Chronic obstructive pulmonary disease, unspecified: Secondary | ICD-10-CM

## 2023-03-25 DIAGNOSIS — Z8489 Family history of other specified conditions: Secondary | ICD-10-CM | POA: Diagnosis not present

## 2023-03-25 DIAGNOSIS — Z7901 Long term (current) use of anticoagulants: Secondary | ICD-10-CM | POA: Diagnosis not present

## 2023-03-25 HISTORY — PX: CORONARY STENT INTERVENTION: CATH118234

## 2023-03-25 HISTORY — PX: LEFT HEART CATH AND CORONARY ANGIOGRAPHY: CATH118249

## 2023-03-25 LAB — BASIC METABOLIC PANEL
Anion gap: 10 (ref 5–15)
Anion gap: 5 (ref 5–15)
BUN: 20 mg/dL (ref 8–23)
BUN: 18 mg/dL (ref 8–23)
CO2: 23 mmol/L (ref 22–32)
CO2: 25 mmol/L (ref 22–32)
Calcium: 8.4 mg/dL — ABNORMAL LOW (ref 8.9–10.3)
Calcium: 8.2 mg/dL — ABNORMAL LOW (ref 8.9–10.3)
Chloride: 105 mmol/L (ref 98–111)
Chloride: 108 mmol/L (ref 98–111)
Creatinine, Ser: 1.25 mg/dL — ABNORMAL HIGH (ref 0.61–1.24)
Creatinine, Ser: 1.22 mg/dL (ref 0.61–1.24)
GFR, Estimated: 60 mL/min — ABNORMAL LOW (ref >60)
GFR, Estimated: >60 mL/min (ref >60)
Glucose, Bld: 109 mg/dL — ABNORMAL HIGH (ref 70–99)
Glucose, Bld: 109 mg/dL — ABNORMAL HIGH (ref 70–99)
Potassium: 3.6 mmol/L (ref 3.5–5.1)
Potassium: 3.8 mmol/L (ref 3.5–5.1)
Sodium: 138 mmol/L (ref 135–145)
Sodium: 138 mmol/L (ref 135–145)

## 2023-03-25 LAB — POCT ACTIVATED CLOTTING TIME
Activated Clotting Time: 293 seconds
Activated Clotting Time: 299 seconds

## 2023-03-25 LAB — CBC
HCT: 39.2 % (ref 39.0–52.0)
HCT: 42 % (ref 39.0–52.0)
Hemoglobin: 13.4 g/dL (ref 13.0–17.0)
Hemoglobin: 13.9 g/dL (ref 13.0–17.0)
MCH: 29.1 pg (ref 26.0–34.0)
MCH: 29.2 pg (ref 26.0–34.0)
MCHC: 33.1 g/dL (ref 30.0–36.0)
MCHC: 34.2 g/dL (ref 30.0–36.0)
MCV: 85.2 fL (ref 80.0–100.0)
MCV: 88.2 fL (ref 80.0–100.0)
Platelets: 193 10*3/uL (ref 150–400)
Platelets: 194 10*3/uL (ref 150–400)
RBC: 4.6 MIL/uL (ref 4.22–5.81)
RBC: 4.76 MIL/uL (ref 4.22–5.81)
RDW: 14.5 % (ref 11.5–15.5)
RDW: 14.6 % (ref 11.5–15.5)
WBC: 7.4 10*3/uL (ref 4.0–10.5)
WBC: 7.6 10*3/uL (ref 4.0–10.5)
nRBC: 0 % (ref 0.0–0.2)
nRBC: 0 % (ref 0.0–0.2)

## 2023-03-25 LAB — MAGNESIUM
Magnesium: 2 mg/dL (ref 1.7–2.4)
Magnesium: 2.1 mg/dL (ref 1.7–2.4)

## 2023-03-25 LAB — PHOSPHORUS: Phosphorus: 3 mg/dL (ref 2.5–4.6)

## 2023-03-25 LAB — VITAMIN B12: Vitamin B-12: 406 pg/mL (ref 180–914)

## 2023-03-25 LAB — TROPONIN I (HIGH SENSITIVITY)
Troponin I (High Sensitivity): 497 ng/L (ref <18)
Troponin I (High Sensitivity): 1098 ng/L (ref <18)
Troponin I (High Sensitivity): 6991 ng/L (ref <18)
Troponin I (High Sensitivity): 8854 ng/L (ref <18)
Troponin I (High Sensitivity): 16348 ng/L (ref <18)

## 2023-03-25 LAB — HEPARIN LEVEL (UNFRACTIONATED): Heparin Unfractionated: 0.5 [IU]/mL (ref 0.30–0.70)

## 2023-03-25 LAB — LIPID PANEL
Cholesterol: 144 mg/dL (ref 0–200)
HDL: 54 mg/dL (ref >40)
LDL Cholesterol: 84 mg/dL (ref 0–99)
Total CHOL/HDL Ratio: 2.7 RATIO
Triglycerides: 32 mg/dL (ref <150)
VLDL: 6 mg/dL (ref 0–40)

## 2023-03-25 LAB — PROTIME-INR
INR: 1.1 (ref 0.8–1.2)
Prothrombin Time: 13.9 seconds (ref 11.4–15.2)

## 2023-03-25 LAB — APTT: aPTT: 27 seconds (ref 24–36)

## 2023-03-25 LAB — D-DIMER, QUANTITATIVE: D-Dimer, Quant: 0.5 ug{FEU}/mL (ref 0.00–0.50)

## 2023-03-25 LAB — VITAMIN D 25 HYDROXY (VIT D DEFICIENCY, FRACTURES): Vit D, 25-Hydroxy: 33.66 ng/mL (ref 30–100)

## 2023-03-25 LAB — T4, FREE: Free T4: 1.05 ng/dL (ref 0.61–1.12)

## 2023-03-25 LAB — TSH: TSH: 4.839 u[IU]/mL — ABNORMAL HIGH (ref 0.350–4.500)

## 2023-03-25 SURGERY — LEFT HEART CATH AND CORONARY ANGIOGRAPHY
Anesthesia: Moderate Sedation

## 2023-03-25 MED ORDER — CLONIDINE HCL 0.1 MG PO TABS
0.2000 mg | ORAL_TABLET | Freq: Two times a day (BID) | ORAL | Status: DC
  Administered 2023-03-25 – 2023-03-26 (×3): 0.2 mg via ORAL
  Filled 2023-03-25 (×3): qty 2

## 2023-03-25 MED ORDER — FENTANYL CITRATE (PF) 100 MCG/2ML IJ SOLN
INTRAMUSCULAR | Status: DC | PRN
  Administered 2023-03-25: 25 ug via INTRAVENOUS

## 2023-03-25 MED ORDER — LABETALOL HCL 5 MG/ML IV SOLN: 10.0000 mg | INTRAVENOUS | Status: AC | PRN

## 2023-03-25 MED ORDER — LEVOTHYROXINE SODIUM 25 MCG PO TABS
25.0000 ug | ORAL_TABLET | Freq: Every day | ORAL | Status: DC
  Administered 2023-03-25: 25 ug via ORAL
  Filled 2023-03-25: qty 1

## 2023-03-25 MED ORDER — TRAZODONE HCL 50 MG PO TABS: 25.0000 mg | ORAL_TABLET | Freq: Every evening | ORAL | Status: DC | PRN

## 2023-03-25 MED ORDER — IRBESARTAN 150 MG PO TABS
75.0000 mg | ORAL_TABLET | Freq: Every day | ORAL | Status: DC
  Administered 2023-03-25 – 2023-03-26 (×2): 75 mg via ORAL
  Filled 2023-03-25 (×2): qty 1

## 2023-03-25 MED ORDER — ASPIRIN 81 MG PO CHEW
CHEWABLE_TABLET | ORAL | Status: AC
  Filled 2023-03-25: qty 1

## 2023-03-25 MED ORDER — LIDOCAINE HCL 1 % IJ SOLN
INTRAMUSCULAR | Status: AC
  Filled 2023-03-25: qty 20

## 2023-03-25 MED ORDER — METOPROLOL TARTRATE 25 MG PO TABS
25.0000 mg | ORAL_TABLET | Freq: Every day | ORAL | Status: DC
  Administered 2023-03-25: 25 mg via ORAL
  Filled 2023-03-25: qty 1

## 2023-03-25 MED ORDER — VERAPAMIL HCL 2.5 MG/ML IV SOLN
INTRAVENOUS | Status: DC | PRN
  Administered 2023-03-25: 2.5 mg via INTRAVENOUS

## 2023-03-25 MED ORDER — VERAPAMIL HCL 2.5 MG/ML IV SOLN
INTRAVENOUS | Status: AC
  Filled 2023-03-25: qty 2

## 2023-03-25 MED ORDER — FINASTERIDE 5 MG PO TABS
5.0000 mg | ORAL_TABLET | Freq: Every day | ORAL | Status: DC
  Administered 2023-03-25 – 2023-03-26 (×2): 5 mg via ORAL
  Filled 2023-03-25 (×2): qty 1

## 2023-03-25 MED ORDER — ATORVASTATIN CALCIUM 80 MG PO TABS
80.0000 mg | ORAL_TABLET | Freq: Every day | ORAL | Status: DC
  Administered 2023-03-25 – 2023-03-26 (×2): 80 mg via ORAL
  Filled 2023-03-25: qty 4
  Filled 2023-03-25: qty 1

## 2023-03-25 MED ORDER — METOPROLOL TARTRATE 25 MG PO TABS
25.0000 mg | ORAL_TABLET | Freq: Two times a day (BID) | ORAL | Status: DC
  Administered 2023-03-26: 25 mg via ORAL
  Filled 2023-03-25 (×2): qty 1

## 2023-03-25 MED ORDER — SODIUM CHLORIDE 0.9 % IV SOLN: 250.0000 mL | INTRAVENOUS | Status: DC | PRN

## 2023-03-25 MED ORDER — ACETAMINOPHEN 325 MG PO TABS: 650.0000 mg | ORAL_TABLET | ORAL | Status: DC | PRN

## 2023-03-25 MED ORDER — HEPARIN SODIUM (PORCINE) 1000 UNIT/ML IJ SOLN
INTRAMUSCULAR | Status: AC
  Filled 2023-03-25: qty 10

## 2023-03-25 MED ORDER — SODIUM CHLORIDE 0.9 % IV BOLUS (SEPSIS)
500.0000 mL | Freq: Once | INTRAVENOUS | Status: AC
  Administered 2023-03-25: 500 mL via INTRAVENOUS

## 2023-03-25 MED ORDER — PANTOPRAZOLE SODIUM 20 MG PO TBEC
20.0000 mg | DELAYED_RELEASE_TABLET | Freq: Two times a day (BID) | ORAL | Status: DC
  Administered 2023-03-25 – 2023-03-26 (×3): 20 mg via ORAL
  Filled 2023-03-25 (×3): qty 1

## 2023-03-25 MED ORDER — SODIUM CHLORIDE 0.9% FLUSH: 3.0000 mL | Freq: Two times a day (BID) | INTRAVENOUS | Status: DC

## 2023-03-25 MED ORDER — IOHEXOL 300 MG/ML  SOLN
INTRAMUSCULAR | Status: DC | PRN
  Administered 2023-03-25: 415 mL

## 2023-03-25 MED ORDER — ASPIRIN 81 MG PO CHEW: 324.0000 mg | CHEWABLE_TABLET | ORAL | Status: DC

## 2023-03-25 MED ORDER — ASPIRIN 81 MG PO CHEW
81.0000 mg | CHEWABLE_TABLET | ORAL | Status: AC
  Administered 2023-03-25: 81 mg via ORAL

## 2023-03-25 MED ORDER — FLUTICASONE FUROATE-VILANTEROL 200-25 MCG/ACT IN AEPB
1.0000 | INHALATION_SPRAY | Freq: Every day | RESPIRATORY_TRACT | Status: DC
  Filled 2023-03-25: qty 28

## 2023-03-25 MED ORDER — ATROPINE SULFATE 1 MG/10ML IJ SOSY: 1.0000 mg | PREFILLED_SYRINGE | Freq: Once | INTRAMUSCULAR | Status: DC

## 2023-03-25 MED ORDER — MORPHINE SULFATE (PF) 2 MG/ML IV SOLN
2.0000 mg | INTRAVENOUS | Status: DC | PRN
  Administered 2023-03-25 (×2): 2 mg via INTRAVENOUS
  Filled 2023-03-25 (×2): qty 1

## 2023-03-25 MED ORDER — FENTANYL CITRATE (PF) 100 MCG/2ML IJ SOLN
INTRAMUSCULAR | Status: AC
  Filled 2023-03-25: qty 2

## 2023-03-25 MED ORDER — SODIUM CHLORIDE 0.9 % WEIGHT BASED INFUSION: 1.0000 mL/kg/h | INTRAVENOUS | Status: AC

## 2023-03-25 MED ORDER — MAGNESIUM HYDROXIDE 400 MG/5ML PO SUSP: 30.0000 mL | Freq: Every day | ORAL | Status: DC | PRN

## 2023-03-25 MED ORDER — TICAGRELOR 90 MG PO TABS
90.0000 mg | ORAL_TABLET | Freq: Two times a day (BID) | ORAL | Status: DC
  Administered 2023-03-26: 90 mg via ORAL
  Filled 2023-03-25: qty 1

## 2023-03-25 MED ORDER — HYDRALAZINE HCL 20 MG/ML IJ SOLN: 10.0000 mg | INTRAMUSCULAR | Status: AC | PRN

## 2023-03-25 MED ORDER — ASPIRIN 81 MG PO CHEW: 324.0000 mg | CHEWABLE_TABLET | Freq: Once | ORAL | Status: DC

## 2023-03-25 MED ORDER — HEPARIN (PORCINE) IN NACL 1000-0.9 UT/500ML-% IV SOLN
INTRAVENOUS | Status: DC | PRN
  Administered 2023-03-25: 500 mL

## 2023-03-25 MED ORDER — ASPIRIN 81 MG PO TBEC
81.0000 mg | DELAYED_RELEASE_TABLET | Freq: Every day | ORAL | Status: DC
  Administered 2023-03-26: 81 mg via ORAL
  Filled 2023-03-25: qty 1

## 2023-03-25 MED ORDER — LEVOTHYROXINE SODIUM 50 MCG PO TABS
50.0000 ug | ORAL_TABLET | Freq: Every day | ORAL | Status: DC
  Administered 2023-03-26: 50 ug via ORAL
  Filled 2023-03-25: qty 1

## 2023-03-25 MED ORDER — MIDAZOLAM HCL 2 MG/2ML IJ SOLN
INTRAMUSCULAR | Status: AC
  Filled 2023-03-25: qty 2

## 2023-03-25 MED ORDER — ALBUTEROL SULFATE (2.5 MG/3ML) 0.083% IN NEBU: 2.5000 mg | INHALATION_SOLUTION | Freq: Four times a day (QID) | RESPIRATORY_TRACT | Status: DC | PRN

## 2023-03-25 MED ORDER — SODIUM CHLORIDE 0.9 % WEIGHT BASED INFUSION: 1.0000 mL/kg/h | INTRAVENOUS | Status: DC

## 2023-03-25 MED ORDER — HEPARIN SODIUM (PORCINE) 1000 UNIT/ML IJ SOLN
INTRAMUSCULAR | Status: DC | PRN
  Administered 2023-03-25: 5000 [IU] via INTRAVENOUS
  Administered 2023-03-25: 6000 [IU] via INTRAVENOUS
  Administered 2023-03-25: 3000 [IU] via INTRAVENOUS

## 2023-03-25 MED ORDER — HEPARIN (PORCINE) IN NACL 2000-0.9 UNIT/L-% IV SOLN
INTRAVENOUS | Status: DC | PRN
  Administered 2023-03-25: 1000 mL

## 2023-03-25 MED ORDER — HEPARIN BOLUS VIA INFUSION
4000.0000 [IU] | Freq: Once | INTRAVENOUS | Status: AC
  Administered 2023-03-25: 4000 [IU] via INTRAVENOUS
  Filled 2023-03-25: qty 4000

## 2023-03-25 MED ORDER — MIDAZOLAM HCL 2 MG/2ML IJ SOLN
INTRAMUSCULAR | Status: DC | PRN
  Administered 2023-03-25: 1 mg via INTRAVENOUS

## 2023-03-25 MED ORDER — BACLOFEN 10 MG PO TABS
20.0000 mg | ORAL_TABLET | Freq: Two times a day (BID) | ORAL | Status: DC
  Filled 2023-03-25: qty 2

## 2023-03-25 MED ORDER — ONDANSETRON HCL 4 MG/2ML IJ SOLN: 4.0000 mg | Freq: Four times a day (QID) | INTRAMUSCULAR | Status: DC | PRN

## 2023-03-25 MED ORDER — LIDOCAINE HCL (PF) 1 % IJ SOLN
INTRAMUSCULAR | Status: DC | PRN
  Administered 2023-03-25: 30 mL

## 2023-03-25 MED ORDER — NITROGLYCERIN 0.4 MG SL SUBL
0.4000 mg | SUBLINGUAL_TABLET | SUBLINGUAL | Status: DC | PRN
  Administered 2023-03-25: 0.4 mg via SUBLINGUAL
  Filled 2023-03-25: qty 1

## 2023-03-25 MED ORDER — TICAGRELOR 90 MG PO TABS
ORAL_TABLET | ORAL | Status: AC
  Filled 2023-03-25: qty 2

## 2023-03-25 MED ORDER — ASPIRIN 81 MG PO CHEW: 81.0000 mg | CHEWABLE_TABLET | Freq: Every day | ORAL | Status: DC

## 2023-03-25 MED ORDER — HEPARIN (PORCINE) IN NACL 1000-0.9 UT/500ML-% IV SOLN
INTRAVENOUS | Status: AC
  Filled 2023-03-25: qty 1000

## 2023-03-25 MED ORDER — ATROPINE SULFATE 1 MG/ML IV SOLN: 0.5000 mg | Freq: Once | INTRAVENOUS | Status: DC

## 2023-03-25 MED ORDER — SODIUM CHLORIDE 0.9 % WEIGHT BASED INFUSION
3.0000 mL/kg/h | INTRAVENOUS | Status: DC
  Administered 2023-03-25: 3 mL/kg/h via INTRAVENOUS

## 2023-03-25 MED ORDER — HEPARIN (PORCINE) 25000 UT/250ML-% IV SOLN
1300.0000 [IU]/h | INTRAVENOUS | Status: DC
  Administered 2023-03-25: 1300 [IU]/h via INTRAVENOUS
  Filled 2023-03-25: qty 250

## 2023-03-25 MED ORDER — TAMSULOSIN HCL 0.4 MG PO CAPS
0.4000 mg | ORAL_CAPSULE | Freq: Every day | ORAL | Status: DC
  Administered 2023-03-25 – 2023-03-26 (×2): 0.4 mg via ORAL
  Filled 2023-03-25 (×2): qty 1

## 2023-03-25 MED ORDER — SODIUM CHLORIDE 0.9% FLUSH: 3.0000 mL | INTRAVENOUS | Status: DC | PRN

## 2023-03-25 MED ORDER — SODIUM CHLORIDE 0.9% FLUSH: 3.0000 mL | Freq: Two times a day (BID) | INTRAVENOUS | Status: AC

## 2023-03-25 MED ORDER — ASPIRIN 300 MG RE SUPP: 300.0000 mg | RECTAL | Status: DC

## 2023-03-25 MED ORDER — MONTELUKAST SODIUM 10 MG PO TABS
10.0000 mg | ORAL_TABLET | Freq: Every day | ORAL | Status: DC
  Administered 2023-03-25: 10 mg via ORAL
  Filled 2023-03-25: qty 1

## 2023-03-25 MED ORDER — TICAGRELOR 90 MG PO TABS
ORAL_TABLET | ORAL | Status: DC | PRN
  Administered 2023-03-25: 180 mg via ORAL

## 2023-03-25 MED ORDER — FLUTICASONE PROPIONATE 50 MCG/ACT NA SUSP: 1.0000 | Freq: Every day | NASAL | Status: DC | PRN

## 2023-03-25 MED ORDER — ATROPINE SULFATE 1 MG/10ML IJ SOSY
PREFILLED_SYRINGE | INTRAMUSCULAR | Status: AC
  Administered 2023-03-25: 1 mg
  Filled 2023-03-25: qty 10

## 2023-03-25 MED ORDER — POTASSIUM CHLORIDE 20 MEQ PO PACK
40.0000 meq | PACK | Freq: Once | ORAL | Status: AC
  Administered 2023-03-25: 40 meq via ORAL
  Filled 2023-03-25: qty 2

## 2023-03-25 SURGICAL SUPPLY — 19 items
BALLN TREK RX 2.5X12 (BALLOONS) ×1
BALLOON TREK RX 2.5X12 (BALLOONS) IMPLANT
CATH 5FR JL3.5 JR4 ANG PIG MP (CATHETERS) IMPLANT
CATH VISTA GUIDE 6FR XB3.5 (CATHETERS) IMPLANT
DEVICE RAD TR BAND REGULAR (VASCULAR PRODUCTS) IMPLANT
DRAPE BRACHIAL (DRAPES) IMPLANT
GLIDESHEATH SLEND SS 6F .021 (SHEATH) IMPLANT
GUIDEWIRE INQWIRE 1.5J.035X260 (WIRE) IMPLANT
INQWIRE 1.5J .035X260CM (WIRE) ×1
KIT ENCORE 26 ADVANTAGE (KITS) IMPLANT
KIT ESSENTIALS PG (KITS) IMPLANT
PACK CARDIAC CATH (CUSTOM PROCEDURE TRAY) ×2 IMPLANT
PROTECTION STATION PRESSURIZED (MISCELLANEOUS) ×1
SET ATX-X65L (MISCELLANEOUS) IMPLANT
SHEATH 6FR 85 DEST SLENDER (SHEATH) IMPLANT
STATION PROTECTION PRESSURIZED (MISCELLANEOUS) IMPLANT
STENT ONYX FRONTIER 2.5X12 (Permanent Stent) IMPLANT
TUBING CIL FLEX 10 FLL-RA (TUBING) IMPLANT
WIRE G HI TQ BMW 190 (WIRE) IMPLANT

## 2023-03-25 NOTE — Consult Note (Signed)
[facility] CLINIC CARDIOLOGY CONSULT NOTE       Patient ID: Keith Craig. MRN: [id number] DOB/AGE: 03/04/1946 77 y.o.  Admit date: 03/24/2023 Referring Physician Dr. Valente David  Primary Physician Dr. Judithann Sheen  Primary Cardiologist previous Dr. Lady Gary (last seen 2018)  Reason for Consultation NSTEMI  HPI: Keith Craig. [name] Is a 77yoM with a PMH of COPD, HTN, nonobstructive CAD by LHC (2009), who presented to [facility] ED 03/24/2023 with  dull/nagging central chest pain and associated shortness of breath, nausea, and diaphoresis, worsened with exertion.  Was found to be in AF per EMS with rates in the 50s to 140s.  EKGs show lateral ST depressions and troponins elevated to a peak of 6991, ruling in NSTEMI.  Cardiology is consulted for further assistance.  The patient states he was at work (works part-time at an [employer]) and started having a dull central chest pain that radiated to the right and left sides of his chest on 8/13 toward the end of the workday.  He left work and came home around 4 PM, and helped his grandson install a bumper on his truck when his chest pain intensified to the point where he needed to stop and rest.  He describes it as a dull ache that just will not go away and "feels like he got punched" in the center of his chest.  He had associated shortness of breath, nausea and sweatiness that is improving with IV morphine.  He was found to be in atrial fibrillation with variable rates when EMS arrived which is also a new finding.  In the emergency department he was given SL nitroglycerin which dropped his blood pressure to the 80s systolic, and his heart rate decreased to the 30s, symptomatic with drowsiness and dizziness.  He was given IV fluids, and 1 mg of IV atropine with improvement in his heart rate to the 80s, and SBP to the 100s.  Overnight he converted back to NSR with rates in the 70s.  The patient took 6 x 81 mg of baby aspirin at home, he was started on a heparin  infusion and admitted for further workup.  At my time of evaluation this morning he is sitting up in bed, pleasant and conversational, although fatigued after "having an uneventful night."  And getting some morphine shortly before my time of evaluation.  His chest pain is "easing off" and denies shortness of breath, lightheadedness or dizziness, heart racing or palpitations or lower extremity edema.  Vitals are notable for a blood pressure of 148/95, heart rate in the 70s to 80s in sinus rhythm on telemetry, did have a ~45-minute period of AF RVR with rate in the 120s to 130s earlier a.m. while in the PCU.  Labs notable for BUN/creatinine 25/1.22 and GFR >60.  Potassium 3.8, mag 2.1.  High-sensitivity troponin elevated on first check at 497 and uptrending to 1098, and 6991.  TC 144, HDL 54, LDL 84, triglycerides 32.  No leukocytosis with WBC 7.6, H&H stable at 13.4/39.2, platelets 193.  TSH borderline elevated at 4.839, free T4 within normal limits at 1.05.  Chest x-ray clear without pleural effusion, vascular congestion, or pneumothorax.  Review of systems complete and found to be negative unless listed above     Past Medical History:  Diagnosis Date   Arthritis    Benign essential microscopic hematuria    BPH with obstruction/lower urinary tract symptoms    Chills    Chronic airway obstruction (HCC)  Chronic obstructive asthma    Esophageal reflux    HTN (hypertension)    Over weight     Past Surgical History:  Procedure Laterality Date   cataract surgery Bilateral    CHOLECYSTECTOMY     CYSTOSCOPY W/ RETROGRADES Right 06/25/2020   Procedure: CYSTOSCOPY WITH RETROGRADE PYELOGRAM;  Surgeon: Vanna Scotland, MD;  Location: [facility] ORS;  Service: Urology;  Laterality: Right;   CYSTOSCOPY WITH DIRECT VISION INTERNAL URETHROTOMY N/A 06/25/2020   Procedure: CYSTOSCOPY WITH DIRECT VISION INTERNAL URETHROTOMY;  Surgeon: Vanna Scotland, MD;  Location: [facility] ORS;  Service: Urology;  Laterality:  N/A;   CYSTOSCOPY WITH RETROGRADE URETHROGRAM N/A 06/25/2020   Procedure: CYSTOSCOPY WITH RETROGRADE URETHROGRAM;  Surgeon: Vanna Scotland, MD;  Location: [facility] ORS;  Service: Urology;  Laterality: N/A;   CYSTOSCOPY WITH URETHRAL DILATATION N/A 06/25/2020   Procedure: CYSTOSCOPY WITH URETHRAL DILATATION;  Surgeon: Vanna Scotland, MD;  Location: [facility] ORS;  Service: Urology;  Laterality: N/A;   HIATAL HERNIA REPAIR     microvascular decompression of left trigeminal nerve     RHIZOTOMY     small bowel restrictions     URETHRAL STRICTURE DILATATION      Medications Prior to Admission  Medication Sig Dispense Refill Last Dose   albuterol (PROVENTIL HFA;VENTOLIN HFA) 108 (90 BASE) MCG/ACT inhaler Inhale 2 puffs into the lungs every 6 (six) hours as needed for wheezing or shortness of breath.   prn at prn   cloNIDine (CATAPRES) 0.2 MG tablet Take 0.2 mg by mouth 2 (two) times daily.   Unknown at Unknown   fluticasone (FLONASE) 50 MCG/ACT nasal spray Place 1 spray into both nostrils daily as needed for allergies or rhinitis.   prn at prn   Fluticasone-Salmeterol (ADVAIR) 500-50 MCG/DOSE AEPB Inhale 1 puff into the lungs 2 (two) times daily.   prn at prn   levothyroxine (SYNTHROID) 25 MCG tablet Take 25 mcg by mouth daily.   Past Month at Unknown   metoprolol tartrate (LOPRESSOR) 25 MG tablet Take 1 tablet by mouth daily.   Unknown at Unknown   montelukast (SINGULAIR) 10 MG tablet Take 10 mg by mouth at bedtime.    Unknown at Unknown   pantoprazole (PROTONIX) 20 MG tablet Take 20 mg by mouth 2 (two) times daily.   Unknown at Unknown   predniSONE (DELTASONE) 5 MG tablet Take 5 mg by mouth daily with breakfast.   Unknown at Unknown   tamsulosin (FLOMAX) 0.4 MG CAPS capsule Take 1 capsule (0.4 mg total) by mouth daily. 90 capsule 3 Unknown at Unknown   valsartan (DIOVAN) 80 MG tablet Take 80 mg by mouth 2 (two) times daily.    Unknown at Unknown   baclofen (LIORESAL) 20 MG tablet Take by mouth. (Patient  not taking: Reported on 03/25/2023)   Not Taking   finasteride (PROSCAR) 5 MG tablet Take 1 tablet (5 mg total) by mouth daily. 90 tablet 3 Unknown at Unknown   Social History   Socioeconomic History   Marital status: Married    Spouse name: Not on file   Number of children: Not on file   Years of education: Not on file   Highest education level: Not on file  Occupational History   Not on file  Tobacco Use   Smoking status: Former    Current packs/day: 0.00    Types: Cigarettes    Quit date: 1971    Years since quitting: 53.6   Smokeless tobacco: Never  Vaping Use   Vaping  status: Never Used  Substance and Sexual Activity   Alcohol use: No    Alcohol/week: 0.0 standard drinks of alcohol   Drug use: No   Sexual activity: Not on file  Other Topics Concern   Not on file  Social History Narrative   Caffeine use 2 servings per day.   Social Determinants of Health   Financial Resource Strain: Low Risk  (02/16/2023)   Received from [facility]   Overall Financial Resource Strain (CARDIA)    Difficulty of Paying Living Expenses: Not hard at all  Food Insecurity: No Food Insecurity (03/25/2023)   Hunger Vital Sign    Worried About Running Out of Food in the Last Year: Never true    Ran Out of Food in the Last Year: Never true  Transportation Needs: No Transportation Needs (03/25/2023)   PRAPARE - Administrator, Civil Service (Medical): No    Lack of Transportation (Non-Medical): No  Physical Activity: Not on file  Stress: Not on file  Social Connections: Not on file  Intimate Partner Violence: Not At Risk (03/25/2023)   Humiliation, Afraid, Rape, and Kick questionnaire    Fear of Current or Ex-Partner: No    Emotionally Abused: No    Physically Abused: No    Sexually Abused: No    Family History  Problem Relation Age of Onset   Esophagitis Mother    Arthritis/Rheumatoid Father    Kidney disease Neg Hx    Prostate cancer Neg Hx    Kidney  cancer Neg Hx    Bladder Cancer Neg Hx       Intake/Output Summary (Last 24 hours) at 03/25/2023 0910 Last data filed at 03/24/2023 2341 Gross per 24 hour  Intake 600 ml  Output --  Net 600 ml    Vitals:   03/25/23 0135 03/25/23 0145 03/25/23 0310 03/25/23 0750  BP: 122/84 (!) 144/88 (!) 147/96 (!) 148/95  Pulse: 87 85 78 71  Resp: 14 17 18 16   Temp:   97.7 F (36.5 C) 97.6 F (36.4 C)  TempSrc:    Oral  SpO2: 97% 100% 100% 98%  Weight:      Height:        PHYSICAL EXAM General: Pleasant, conversational elderly gentleman, well nourished, in no acute distress.  Sitting upright in bed in PCU. HEENT:  Normocephalic and atraumatic. Neck:  No JVD.  Lungs: Normal respiratory effort on room air.  Decreased breath sounds without appreciable crackles or wheezes.   Heart: HRRR . Normal S1 and S2 without gallops or murmurs.  Abdomen: Non-distended appearing with excess adiposity.  Msk: Normal strength and tone for age. Extremities: Warm and well perfused. No clubbing, cyanosis.  No peripheral edema.  Neuro: Alert and oriented X 3. Psych:  Answers questions appropriately.   Labs: Basic Metabolic Panel: Recent Labs    03/25/23 0014 03/25/23 0411  NA 138 138  K 3.6 3.8  CL 105 108  CO2 23 25  GLUCOSE 109* 109*  BUN 20 18  CREATININE 1.25* 1.22  CALCIUM 8.4* 8.2*  MG 2.0  --    Liver Function Tests: No results for input(s): "AST", "ALT", "ALKPHOS", "BILITOT", "PROT", "ALBUMIN" in the last 72 hours. No results for input(s): "LIPASE", "AMYLASE" in the last 72 hours. CBC: Recent Labs    03/25/23 0014 03/25/23 0411  WBC 7.4 7.6  HGB 13.9 13.4  HCT 42.0 39.2  MCV 88.2 85.2  PLT 194 193   Cardiac Enzymes: Recent  Labs    03/25/23 0014 03/25/23 0209  TROPONINIHS 497* 1,098*   BNP: No results for input(s): "BNP" in the last 72 hours. D-Dimer: Recent Labs    03/25/23 0014  DDIMER 0.50   Hemoglobin A1C: No results for input(s): "HGBA1C" in the last 72  hours. Fasting Lipid Panel: Recent Labs    03/25/23 0411  CHOL 144  HDL 54  LDLCALC 84  TRIG 32  CHOLHDL 2.7   Thyroid Function Tests: Recent Labs    03/25/23 0014  TSH 4.839*   Anemia Panel: No results for input(s): "VITAMINB12", "FOLATE", "FERRITIN", "TIBC", "IRON", "RETICCTPCT" in the last 72 hours.   Radiology: [facility] Chest Port 1 View  Result Date: 03/25/2023 CLINICAL DATA:  Chest pain, shortness of breath EXAM: PORTABLE CHEST 1 VIEW COMPARISON:  CT chest dated 10/10/2020 FINDINGS: Lungs are clear.  No pleural effusion or pneumothorax. The heart is normal in size.  Thoracic aortic atherosclerosis. IMPRESSION: No acute cardiopulmonary disease. Electronically Signed   By: Charline Bills M.D.   On: 03/25/2023 00:34    ECHO 09/20/2020 DOPPLER ECHO and OTHER SPECIAL PROCEDURES                 Aortic: No AR                      No AS                         141.2 cm/sec peak vel      8.0 mmHg peak grad                 Mitral: TRIVIAL MR                 No MS                         MV Inflow E Vel = 83.1 cm/sec     MV Annulus E'Vel = 4.1 cm/sec                         E/E'Ratio = 20.3              Tricuspid: MILD TR                    No TS                         249.8 cm/sec peak TR vel   28.0 mmHg peak RV pressure              Pulmonary: TRIVIAL PR                 No PS                         116.7 cm/sec peak vel      5.5 mmHg peak grad  _________________________________________________________________________________________  INTERPRETATION  NORMAL LEFT VENTRICULAR SYSTOLIC FUNCTION  NORMAL RIGHT VENTRICULAR SYSTOLIC FUNCTION  MILD VALVULAR REGURGITATION (See above)  NO VALVULAR STENOSIS  TRIVIAL MR, PR  MILD TR  EF 60%  Closest EF: >55% (Estimated)  Mitral: TRIVIAL MR  Tricuspid: MILD TR   Electronically signed by            MD Mariel Kansky on 09/21/2020 01: 13 PM  Performed By: Merla Riches, RDCS     Ordering Physician: Judithann Sheen, JEFFREY   TELEMETRY  reviewed by me (LT) 03/25/2023 : NSR rate 70s to 80s, 45-minute period of AF RVR with rate 120s to 130s earlier a.m. while in the PCU  EKG reviewed by me: NSR with lateral ST depressions, inferior TWI.  Data reviewed by me (LT) 03/25/2023: ED note, admission H&P, last outpatient cardiology note last 24h vitals tele labs imaging I/O   Principal Problem:   NSTEMI (non-ST elevated myocardial infarction) (HCC) Active Problems:   BPH with obstruction/lower urinary tract symptoms   Atrial fibrillation, chronic (HCC)   Hypothyroidism   Essential hypertension   Chronic obstructive pulmonary disease (COPD) (HCC)   New onset atrial fibrillation (HCC)    ASSESSMENT AND PLAN:  Nathanal Lafler. [name] Is a 77yoM with a PMH of COPD, HTN, nonobstructive CAD by LHC (2009), who presented to [facility] ED 03/24/2023 with  dull/nagging central chest pain and associated shortness of breath, nausea, and diaphoresis, worsened with exertion.  Was found to be in AF per EMS with rates in the 50s to 140s.  EKGs show lateral ST depressions and troponins elevated to a peak of 6991, ruling in NSTEMI.  Cardiology is consulted for further assistance.  # NSTEMI Presents with dull, nagging central chest pressure that radiates out to the left and right sides of his chest, associated with shortness of breath, nausea, and diaphoresis, improving with IV morphine.  Became very bradycardic and hypotensive with SL nitroglycerin x 1.  EKGs with inferolateral abnormalities, and troponin  trending for 97, 1098, 6991 with repeats pending. -S/p aspirin load PTA (patient took 6 x 81 mg ASA at home), continue 81 mg aspirin daily -Continue heparin drip until LHC -Trend troponin until peak -Atorvastatin 80 mg daily -Metoprolol tartrate 25 mg daily -Irbesartan 75 mg daily -check A1C. LDL above goal at at 84 -As needed morphine -Defer nitrates with profound bradycardia/hypotension -Echo complete -Discussed the risks and benefits of proceeding  with LHC with the patient.  He is agreeable to proceed.  Plan for LHC with Dr. Dorothyann Peng later this afternoon.  Further recommendations based on its results.  # New paroxysmal AF In AF when EMS arrived with rates reportedly in the 50s-140s, predominantly in NSR on telemetry today with a 45-minute period of AF with rates 120s to 130s earlier a.m.  TSH borderline elevated at 4.8, free T4 within normal limits. -Will continue heparin for anticoagulation in anticipation of LHC, will discuss chronic anticoagulation prior to discharge. CHA2DS2-VASc 4 (age, HTN, CAD). - metoprolol tartrate for rate control   This patient's plan of care was discussed and created with Dr. Juliann Pares and he is in agreement.  Signed: Rebeca Allegra , PA-C 03/25/2023, 9:10 AM [facility]

## 2023-03-25 NOTE — ED Notes (Signed)
Pt HR brady 40's after nitroglycerin admin. MD Ward notified. Pads applied and code cart at the bedside. Bolus NS 1L started and atropine 1mg  admin in 2 doses 0.5mg . Mentation intact.

## 2023-03-25 NOTE — Assessment & Plan Note (Addendum)
-   The patient will be admitted to a progressive unit bed. - Will follow serial troponins and EKGs. - We will continue on IV heparin. - The patient will be placed on aspirin as well as p.r.n. morphine sulfate for pain. - We will obtain a 2D echo and cardiology consult . - I notified Dr. Juliann Pares about the patient.

## 2023-03-25 NOTE — Progress Notes (Signed)
Transition of Care [facility]) - Inpatient Brief Assessment   Patient Details  Name: Keith Craig. MRN: [id number] Date of Birth: 10/07/1945  [facility] CM/SW Contact:    Darolyn Rua, LCSW Phone Number: 03/25/2023, 9:49 AM   Clinical Narrative:  Patient presents to ED from home with SOB and chest pain. PCP: Dr. Judithann Sheen Insurance: Medicare A & B  No needs noted at this time, please consult TOC should needs arise.   Transition of Care Asessment: Insurance and Status: Insurance coverage has been reviewed Patient has primary care physician: Yes Home environment has been reviewed: from home Prior level of function:: independent Prior/Current Home Services: No current home services Social Determinants of Health Reivew: SDOH reviewed no interventions necessary Readmission risk has been reviewed: Yes Transition of care needs: no transition of care needs at this time

## 2023-03-25 NOTE — Assessment & Plan Note (Addendum)
-   He has been having occasional RVR. - Will check free T3 level.  Free T4 was normal with slightly elevated TSH. - We will continue Lopressor. - Cardiology consult to be obtained as mentioned above.

## 2023-03-25 NOTE — Assessment & Plan Note (Signed)
-   We will continue his antihypertensives. 

## 2023-03-25 NOTE — Assessment & Plan Note (Addendum)
-   We will continue Synthroid. - TSH was slightly elevated with normal free T4. - Will obtain a free T3.

## 2023-03-25 NOTE — Progress Notes (Signed)
ANTICOAGULATION CONSULT NOTE  Pharmacy Consult for Heparin  Indication: chest pain/ACS  Allergies  Allergen Reactions   Bactrim [Sulfamethoxazole-Trimethoprim] Nausea And Vomiting   Nitroglycerin Other (See Comments)    After 1 nitroglycerin tablet patient became extremely bradycardic and hypotensive   Vitamin B12 [Cyanocobalamin] Itching and Rash    Injection, tolerates the tablets     Patient Measurements: Height: 6' (182.9 cm) Weight: 95.3 kg (210 lb) IBW/kg (Calculated) : 77.6 Heparin Dosing Weight: 95.3 kg   Vital Signs: Temp: 97.6 F (36.4 C) (08/14 0750) Temp Source: Oral (08/14 0750) BP: 148/95 (08/14 0750) Pulse Rate: 71 (08/14 0750)  Labs: Recent Labs    03/25/23 0014 03/25/23 0209 03/25/23 0411 03/25/23 0911  HGB 13.9  --  13.4  --   HCT 42.0  --  39.2  --   PLT 194  --  193  --   APTT 27  --   --   --   LABPROT 13.9  --   --   --   INR 1.1  --   --   --   HEPARINUNFRC  --   --   --  0.50  CREATININE 1.25*  --  1.22  --   TROPONINIHS 497* 1,098*  --   --     Estimated Creatinine Clearance: 61.7 mL/min (by C-G formula based on SCr of 1.22 mg/dL).   Medical History: Past Medical History:  Diagnosis Date   Arthritis    Benign essential microscopic hematuria    BPH with obstruction/lower urinary tract symptoms    Chills    Chronic airway obstruction (HCC)    Chronic obstructive asthma    Esophageal reflux    HTN (hypertension)    Over weight     Medications:  Medications Prior to Admission  Medication Sig Dispense Refill Last Dose   albuterol (PROVENTIL HFA;VENTOLIN HFA) 108 (90 BASE) MCG/ACT inhaler Inhale 2 puffs into the lungs every 6 (six) hours as needed for wheezing or shortness of breath.   prn at prn   cloNIDine (CATAPRES) 0.2 MG tablet Take 0.2 mg by mouth 2 (two) times daily.   Unknown at Unknown   fluticasone (FLONASE) 50 MCG/ACT nasal spray Place 1 spray into both nostrils daily as needed for allergies or rhinitis.   prn at prn    Fluticasone-Salmeterol (ADVAIR) 500-50 MCG/DOSE AEPB Inhale 1 puff into the lungs 2 (two) times daily.   prn at prn   levothyroxine (SYNTHROID) 25 MCG tablet Take 25 mcg by mouth daily.   Past Month at Unknown   metoprolol tartrate (LOPRESSOR) 25 MG tablet Take 1 tablet by mouth daily.   Unknown at Unknown   montelukast (SINGULAIR) 10 MG tablet Take 10 mg by mouth at bedtime.    Unknown at Unknown   pantoprazole (PROTONIX) 20 MG tablet Take 20 mg by mouth 2 (two) times daily.   Unknown at Unknown   predniSONE (DELTASONE) 5 MG tablet Take 5 mg by mouth daily with breakfast.   Unknown at Unknown   tamsulosin (FLOMAX) 0.4 MG CAPS capsule Take 1 capsule (0.4 mg total) by mouth daily. 90 capsule 3 Unknown at Unknown   valsartan (DIOVAN) 80 MG tablet Take 80 mg by mouth 2 (two) times daily.    Unknown at Unknown   baclofen (LIORESAL) 20 MG tablet Take by mouth. (Patient not taking: Reported on 03/25/2023)   Not Taking   finasteride (PROSCAR) 5 MG tablet Take 1 tablet (5 mg total) by mouth daily. 90  tablet 3 Unknown at Unknown    Assessment: Pharmacy consulted to dose heparin in this 77 year old male admitted with ACS/NSTEMI.  No prior anticoag noted. CrCl = 60.2 ml/min   Goal of Therapy:  Heparin level 0.3-0.7 units/ml Monitor platelets by anticoagulation protocol: Yes   Date/time  HL  Comment 08/14@0911  HL=0.50 Therapeutic x1 at 1300 un/hr  Plan:  Continue heparin infusion at 1300 units/hr Repeat HL in 8 hours to confirm therapeutic rate and daily while on heparin Continue to monitor H&H and platelets   Rodriguez-Guzman PharmD, BCPS 03/25/2023 9:55 AM

## 2023-03-25 NOTE — Progress Notes (Signed)
ANTICOAGULATION CONSULT NOTE - Initial Consult  Pharmacy Consult for Heparin  Indication: chest pain/ACS  Allergies  Allergen Reactions   Bactrim [Sulfamethoxazole-Trimethoprim] Nausea And Vomiting   Vitamin B12 [Cyanocobalamin] Itching and Rash    Injection, tolerates the tablets     Patient Measurements: Height: 6' (182.9 cm) Weight: 95.3 kg (210 lb) IBW/kg (Calculated) : 77.6 Heparin Dosing Weight: 95.3 kg   Vital Signs: Temp: 97.7 F (36.5 C) (08/13 2343) Temp Source: Oral (08/13 2343) BP: 137/101 (08/14 0100) Pulse Rate: 115 (08/14 0100)  Labs: Recent Labs    03/25/23 0014  HGB 13.9  HCT 42.0  PLT 194  CREATININE 1.25*  TROPONINIHS 497*    Estimated Creatinine Clearance: 60.2 mL/min (A) (by C-G formula based on SCr of 1.25 mg/dL (H)).   Medical History: Past Medical History:  Diagnosis Date   Arthritis    Benign essential microscopic hematuria    BPH with obstruction/lower urinary tract symptoms    Chills    Chronic airway obstruction (HCC)    Chronic obstructive asthma    Esophageal reflux    HTN (hypertension)    Over weight     Medications:  (Not in a hospital admission)   Assessment: Pharmacy consulted to dose heparin in this 77 year old male admitted with ACS/NSTEMI.  No prior anticoag noted. CrCl = 60.2 ml/min   Goal of Therapy:  Heparin level 0.3-0.7 units/ml Monitor platelets by anticoagulation protocol: Yes   Plan:  Give 4000 units bolus x 1 Start heparin infusion at 1300 units/hr Check anti-Xa level in 8 hours and daily while on heparin Continue to monitor H&H and platelets  , D 03/25/2023,1:24 AM

## 2023-03-25 NOTE — Progress Notes (Addendum)
Noted resulted troponin from 18:25 PM. Messaged Dr. Juliann Pares now re: newest result. No new orders at present per MD.  Noted: Pt. Sat up in chair 45 min. During band deflation per pt. Request secondary to issues with pt. Voiding (unable to void lying down; pt. States he has "prostate issues").

## 2023-03-25 NOTE — Assessment & Plan Note (Signed)
-   We will continue his Flomax and Proscar.

## 2023-03-25 NOTE — Progress Notes (Signed)
*  PRELIMINARY RESULTS* Echocardiogram 2D Echocardiogram has been performed.  Keith Craig 03/25/2023, 10:57 AM

## 2023-03-25 NOTE — ED Notes (Signed)
Patient HR noted to be in a SR. 12 lead obtained. MD Mansy notified.

## 2023-03-25 NOTE — H&P (Addendum)
[place]   PATIENT NAME: Keith Craig    MR#:  [id number]  DATE OF BIRTH:  1945-09-20  DATE OF ADMISSION:  03/24/2023  PRIMARY CARE PHYSICIAN: Marguarite Arbour, MD   Patient is coming from: Home  REQUESTING/REFERRING PHYSICIAN: Ward, Stephanie Acre, DO  CHIEF COMPLAINT:   Chief Complaint  Patient presents with   Chest Pain    HISTORY OF PRESENT ILLNESS:  Keith Risenhoover. is a 77 y.o. Caucasian male with medical history significant for essential hypertension, GERD, COPD and asthma, BPH and osteoarthritis, who presented to the ER with acute onset of midsternal chest pain that started at 4 PM with radiation to his left arm with associated dyspnea, nausea, and diaphoresis without palpitations.  He denied any cough or wheezing or leg pain or edema or recent travels or surgeries.  No fever or chills.  No bleeding diathesis.  No dysuria, oliguria or hematuria or flank pain.  ED Course: When he came to the ER, BP was 160/95 with heart rate of 103 and otherwise normal vital signs.  Labs revealed a creatinine 1.25 and calcium of 8.4 and his high-sensitivity troponin was 497 and later 1098.  CBC was within normal.  TSH was 4.83 with normal free T4 of 1.05. EKG as reviewed by me : EKG showed atrial fibrillation with controlled ventricular sponsor of 88 with abnormal R wave progression and LVH and T wave inversion laterally. Imaging: Portable chest x-ray showed no acute cardiopulmonary disease.  The patient was given 500 mL IV normal saline bolus, IV heparin bolus and drip and 4 baby aspirin.  The patient dropped his pressure and heart rate after sublingual nitroglycerin when he was given 500 mL IV normal saline bolus.  He will be admitted to a progressive unit bed for further evaluation and management.   PAST MEDICAL HISTORY:   Past Medical History:  Diagnosis Date   Arthritis    Benign essential microscopic hematuria    BPH with obstruction/lower urinary tract symptoms    Chills     Chronic airway obstruction (HCC)    Chronic obstructive asthma    Esophageal reflux    HTN (hypertension)    Over weight     PAST SURGICAL HISTORY:   Past Surgical History:  Procedure Laterality Date   cataract surgery Bilateral    CHOLECYSTECTOMY     CYSTOSCOPY W/ RETROGRADES Right 06/25/2020   Procedure: CYSTOSCOPY WITH RETROGRADE PYELOGRAM;  Surgeon: Vanna Scotland, MD;  Location: [facility];  Service: Urology;  Laterality: Right;   CYSTOSCOPY WITH DIRECT VISION INTERNAL URETHROTOMY N/A 06/25/2020   Procedure: CYSTOSCOPY WITH DIRECT VISION INTERNAL URETHROTOMY;  Surgeon: Vanna Scotland, MD;  Location: [facility];  Service: Urology;  Laterality: N/A;   CYSTOSCOPY WITH RETROGRADE URETHROGRAM N/A 06/25/2020   Procedure: CYSTOSCOPY WITH RETROGRADE URETHROGRAM;  Surgeon: Vanna Scotland, MD;  Location: [facility];  Service: Urology;  Laterality: N/A;   CYSTOSCOPY WITH URETHRAL DILATATION N/A 06/25/2020   Procedure: CYSTOSCOPY WITH URETHRAL DILATATION;  Surgeon: Vanna Scotland, MD;  Location: [facility];  Service: Urology;  Laterality: N/A;   HIATAL HERNIA REPAIR     microvascular decompression of left trigeminal nerve     RHIZOTOMY     small bowel restrictions     URETHRAL STRICTURE DILATATION      SOCIAL HISTORY:   Social History   Tobacco Use   Smoking status: Former    Current packs/day: 0.00    Types: Cigarettes    Quit date:  1971    Years since quitting: 53.6   Smokeless tobacco: Never  Substance Use Topics   Alcohol use: No    Alcohol/week: 0.0 standard drinks of alcohol    FAMILY HISTORY:   Family History  Problem Relation Age of Onset   Esophagitis Mother    Arthritis/Rheumatoid Father    Kidney disease Neg Hx    Prostate cancer Neg Hx    Kidney cancer Neg Hx    Bladder Cancer Neg Hx     DRUG ALLERGIES:   Allergies  Allergen Reactions   Bactrim [Sulfamethoxazole-Trimethoprim] Nausea And Vomiting   Nitroglycerin Other (See Comments)    After 1  nitroglycerin tablet patient became extremely bradycardic and hypotensive   Vitamin B12 [Cyanocobalamin] Itching and Rash    Injection, tolerates the tablets     REVIEW OF SYSTEMS:   ROS As per history of present illness. All pertinent systems were reviewed above. Constitutional, HEENT, cardiovascular, respiratory, GI, GU, musculoskeletal, neuro, psychiatric, endocrine, integumentary and hematologic systems were reviewed and are otherwise negative/unremarkable except for positive findings mentioned above in the HPI.   MEDICATIONS AT HOME:   Prior to Admission medications   Medication Sig Start Date End Date Taking? Authorizing Provider  albuterol (PROVENTIL HFA;VENTOLIN HFA) 108 (90 BASE) MCG/ACT inhaler Inhale 2 puffs into the lungs every 6 (six) hours as needed for wheezing or shortness of breath.    [provider]  baclofen (LIORESAL) 20 MG tablet Take by mouth. 09/10/21   [provider]  cloNIDine (CATAPRES) 0.2 MG tablet Take 0.2 mg by mouth 2 (two) times daily.    [provider]  finasteride (PROSCAR) 5 MG tablet Take 1 tablet (5 mg total) by mouth daily. 02/25/23   McGowan, Carollee Herter A, PA-C  fluticasone (FLONASE) 50 MCG/ACT nasal spray Place 1 spray into both nostrils daily as needed for allergies or rhinitis.    [provider]  Fluticasone-Salmeterol (ADVAIR) 500-50 MCG/DOSE AEPB Inhale 1 puff into the lungs 2 (two) times daily.    [provider]  levothyroxine (SYNTHROID) 25 MCG tablet Take 25 mcg by mouth daily. 03/24/22   [provider]  metoprolol tartrate (LOPRESSOR) 25 MG tablet Take 1 tablet by mouth daily. 07/20/14   [provider]  montelukast (SINGULAIR) 10 MG tablet Take 10 mg by mouth at bedtime.     [provider]  pantoprazole (PROTONIX) 20 MG tablet Take 20 mg by mouth 2 (two) times daily.    [provider]  predniSONE (DELTASONE) 5 MG tablet Take 5 mg by mouth daily with breakfast.     [provider]  tamsulosin (FLOMAX) 0.4 MG CAPS capsule Take 1 capsule (0.4 mg total) by mouth daily. 02/25/23   Michiel Cowboy A, PA-C  valsartan (DIOVAN) 80 MG tablet Take 80 mg by mouth 2 (two) times daily.     [provider]      VITAL SIGNS:  Blood pressure (!) 147/96, pulse 78, temperature 97.7 F (36.5 C), resp. rate 18, height 6' (1.829 m), weight 95.3 kg, SpO2 100%.  PHYSICAL EXAMINATION:  Physical Exam  GENERAL:  77 y.o.-year-old Caucasian male patient lying in the bed with no acute distress.  EYES: Pupils equal, round, reactive to light and accommodation. No scleral icterus. Extraocular muscles intact.  HEENT: Head atraumatic, normocephalic. Oropharynx and nasopharynx clear.  NECK:  Supple, no jugular venous distention. No thyroid enlargement, no tenderness.  LUNGS: Normal breath sounds bilaterally, no wheezing, rales,rhonchi or crepitation. No use of accessory  muscles of respiration.  CARDIOVASCULAR: Irregularly irregular rhythm, S1, S2 normal. No murmurs, rubs, or gallops.  ABDOMEN: Soft, nondistended, nontender. Bowel sounds present. No organomegaly or mass.  EXTREMITIES: No pedal edema, cyanosis, or clubbing.  NEUROLOGIC: Cranial nerves II through XII are intact. Muscle strength 5/5 in all extremities. Sensation intact. Gait not checked.  PSYCHIATRIC: The patient is alert and oriented x 3.  Normal affect and good eye contact. SKIN: No obvious rash, lesion, or ulcer.   LABORATORY PANEL:   CBC Recent Labs  Lab 03/25/23 0411  WBC 7.6  HGB 13.4  HCT 39.2  PLT 193   ------------------------------------------------------------------------------------------------------------------  Chemistries  Recent Labs  Lab 03/25/23 0014 03/25/23 0411  NA 138 138  K 3.6 3.8  CL 105 108  CO2 23 25  GLUCOSE 109* 109*  BUN 20 18  CREATININE 1.25* 1.22  CALCIUM 8.4* 8.2*  MG 2.0  --     ------------------------------------------------------------------------------------------------------------------  Cardiac Enzymes No results for input(s): "TROPONINI" in the last 168 hours. ------------------------------------------------------------------------------------------------------------------  RADIOLOGY:  DG Chest Port 1 View  Result Date: 03/25/2023 CLINICAL DATA:  Chest pain, shortness of breath EXAM: PORTABLE CHEST 1 VIEW COMPARISON:  CT chest dated 10/10/2020 FINDINGS: Lungs are clear.  No pleural effusion or pneumothorax. The heart is normal in size.  Thoracic aortic atherosclerosis. IMPRESSION: No acute cardiopulmonary disease. Electronically Signed   By: Charline Bills M.D.   On: 03/25/2023 00:34      IMPRESSION AND PLAN:  Assessment and Plan: * NSTEMI (non-ST elevated myocardial infarction) [facility]) - The patient will be admitted to a progressive unit bed. - Will follow serial troponins and EKGs. - We will continue on IV heparin. - The patient will be placed on aspirin as well as p.r.n. morphine sulfate for pain. - We will obtain a 2D echo and cardiology consult . - I notified Dr. Juliann Pares about the patient.   Atrial fibrillation, chronic (HCC) - He has been having occasional RVR. - Will check free T3 level.  Free T4 was normal with slightly elevated TSH. - We will continue Lopressor. - Cardiology consult to be obtained as mentioned above.  Hypothyroidism - We will continue Synthroid. - TSH was slightly elevated with normal free T4. - Will obtain a free T3.  Essential hypertension - We will continue his antihypertensives.  Chronic obstructive pulmonary disease (COPD) (HCC) - We will continue his inhalers while holding off long-acting beta agonist.  BPH with obstruction/lower urinary tract symptoms - We will continue his Flomax and Proscar.    DVT prophylaxis: Lovenox. Advanced Care Planning:  Code Status: full code. Family Communication:  The  plan of care was discussed in details with the patient (and family). I answered all questions. The patient agreed to proceed with the above mentioned plan. Further management will depend upon hospital course. Disposition Plan: Back to previous home environment Consults called: Cardiology All the records are reviewed and case discussed with ED provider.  Status is: Inpatient    At the time of the admission, it appears that the appropriate admission status for this patient is inpatient.  This is judged to be reasonable and necessary in order to provide the required intensity of service to ensure the patient's safety given the presenting symptoms, physical exam findings and initial radiographic and laboratory data in the context of comorbid conditions.  The patient requires inpatient status due to high intensity of service, high risk of further deterioration and high frequency of surveillance required.  I certify that at the  time of admission, it is my clinical judgment that the patient will require inpatient hospital care extending more than 2 midnights.                            Dispo: The patient is from: Home              Anticipated d/c is to: Home              Patient currently is not medically stable to d/c.              Difficult to place patient: No  Hannah Beat M.D on 03/25/2023 at 6:29 AM  [facility]   From 7 PM-7 AM, contact night-coverage [email or web address]  CC: Primary care physician; Marguarite Arbour, MD

## 2023-03-25 NOTE — Assessment & Plan Note (Addendum)
-   We will continue his inhalers while holding off long-acting beta agonist. 

## 2023-03-25 NOTE — CV Procedure (Signed)
Brief post cath PCI note Patient brought to the cardiac Cath Lab for non-STEMI Diagnostic cardiac cath Right radial approach  Left ventriculogram showed preserved left ventricular function around 50 to 55%  Coronaries Large short left main free of disease LAD was large with a 50% mid lesion TIMI-3 flow Circumflex large 90% distal circumflex hazy lesion IRA TIMI-3 flow RCA large 100% distal PL CTO bridging collaterals   Intervention Successful PCI and stent distal circumflex Placement of a 2.5 x 12 12 mm medium frontier Onyx to 13 atm lesion reduced from 90 down to 0 TIMI-3 flow maintained throughout the case  Patient placed on aspirin Brilinta for at least 12 months Transferred to progressive care Anticipate discharge within 23 hours   Full cath note to follow

## 2023-03-25 NOTE — Progress Notes (Signed)
[facility] Progress Note  Patient: Keith Craig.    ZOX:[id number]  DOA: 03/24/2023     Date of Service: the patient was seen and examined on 03/25/2023  Chief Complaint  Patient presents with   Chest Pain   Brief hospital course: Keith Craig. is a 77 y.o. Caucasian male with medical history significant for essential hypertension, GERD, COPD and asthma, BPH and osteoarthritis, who presented to the ER with acute onset of midsternal chest pain that started at 4 PM with radiation to his left arm with associated dyspnea, nausea, and diaphoresis without palpitations.   ED Course: When he came to the ER, BP was 160/95 with heart rate of 103 and otherwise normal vital signs.  Labs revealed a creatinine 1.25 and calcium of 8.4 and his high-sensitivity troponin was 497 and later 1098.  CBC was within normal.  TSH was 4.83 with normal free T4 of 1.05. EKG as reviewed by me : EKG showed atrial fibrillation with controlled ventricular sponsor of 88 with abnormal R wave progression and LVH and T wave inversion laterally. Imaging: Portable chest x-ray showed no acute cardiopulmonary disease.   The patient was given 500 mL IV normal saline bolus, IV heparin bolus and drip and 4 baby aspirin.  The patient dropped his pressure and heart rate after sublingual nitroglycerin when he was given 500 mL IV normal saline bolus.  He will be admitted to a progressive unit bed for further evaluation and management.    Assessment and Plan:  # NSTEMI (non-ST elevated myocardial infarction) (HCC) Trop 8119 elevated Continue heparin IV infusion Continue aspirin 80 mg p.o. daily, statin p.o. daily nitroglycerin as needed Morphine as needed for pain control Follow-up 2D echocardiogram Cardiology consulted, plan for cardiac catheter today.   # Atrial fibrillation, chronic - He has been having occasional RVR. - TSH 4.8 slightly elevated, free T41.05 Within normal range Lopressor 25 mg p.o. twice  daily Continue heparin IV infusion Follow cardiology for further recommendation  # Essential hypertension - continue irbesartan 75 mg p.o. daily, clonidine 0.2 mg p.o. twice daily Lopressor 25 mg p.o. twice daily Monitor BP and titrate medications accordingly.    # Hypothyroidism - TSH 4.8, slightly elevated Patient was on Synthroid 25 mcg p.o. daily, increased to Synthroid 50 mg p.o. daily Follow with PCP to repeat TSH level after 4 to 6 weeks and titrate dose of Synthroid accordingly.    # Chronic obstructive pulmonary disease (COPD)  - Continue albuterol as needed, patient was on Advair inhaler which was switched to [employer] inhaler. No COPD exacerbation on exam, continue to monitor.    # BPH with obstruction/lower urinary tract symptoms - continued Flomax and Proscar.    Body mass index is 28.48 kg/m.  Interventions:  Diet: NPO for cardiac cath today DVT Prophylaxis: Therapeutic Anticoagulation with heparin IV infusion    Advance goals of care discussion: Full code  Family Communication: family was present at bedside, at the time of interview.  The pt provided permission to discuss medical plan with the family. Opportunity was given to ask question and all questions were answered satisfactorily.   Disposition:  Pt is from home, admitted with NSTEMI, still has chest pain, scheduled for cardiac cath on 8/14, which precludes a safe discharge. Discharge to home, when cleared by cardiology.  Subjective: No significant events overnight, patient still has chest pain midsternal and slightly on the left, 5/10, overall feels improvement.  Denies any radiation of pain.  No  palpitations.  No shortness of breath.  Patient is awaiting for cardiac cath.  Physical Exam: General: NAD, lying comfortably Appear in no distress, affect appropriate Eyes: PERRLA ENT: Oral Mucosa Clear, moist  Neck: no JVD,  Cardiovascular: S1 and S2 Present, no Murmur,  Respiratory: good  respiratory effort, Bilateral Air entry equal and Decreased, no Crackles, no wheezes Abdomen: Bowel Sound present, Soft and no tenderness,  Skin: no rashes Extremities: no Pedal edema, no calf tenderness Neurologic: without any new focal findings Gait not checked due to patient safety concerns  Vitals:   03/25/23 0135 03/25/23 0145 03/25/23 0310 03/25/23 0750  BP: 122/84 (!) 144/88 (!) 147/96 (!) 148/95  Pulse: 87 85 78 71  Resp: 14 17 18 16   Temp:   97.7 F (36.5 C) 97.6 F (36.4 C)  TempSrc:    Oral  SpO2: 97% 100% 100% 98%  Weight:      Height:        Intake/Output Summary (Last 24 hours) at 03/25/2023 1307 Last data filed at 03/25/2023 1044 Gross per 24 hour  Intake 600 ml  Output 175 ml  Net 425 ml   Filed Weights   03/24/23 2345  Weight: 95.3 kg    Data Reviewed: I have personally reviewed and interpreted daily labs, tele strips, imagings as discussed above. I reviewed all nursing notes, pharmacy notes, vitals, pertinent old records I have discussed plan of care as described above with RN and patient/family.  CBC: Recent Labs  Lab 03/25/23 0014 03/25/23 0411  WBC 7.4 7.6  HGB 13.9 13.4  HCT 42.0 39.2  MCV 88.2 85.2  PLT 194 193   Basic Metabolic Panel: Recent Labs  Lab 03/25/23 0014 03/25/23 0411 03/25/23 0911  NA 138 138  --   K 3.6 3.8  --   CL 105 108  --   CO2 23 25  --   GLUCOSE 109* 109*  --   BUN 20 18  --   CREATININE 1.25* 1.22  --   CALCIUM 8.4* 8.2*  --   MG 2.0  --  2.1  PHOS  --   --  3.0    Studies: DG Chest Port 1 View  Result Date: 03/25/2023 CLINICAL DATA:  Chest pain, shortness of breath EXAM: PORTABLE CHEST 1 VIEW COMPARISON:  CT chest dated 10/10/2020 FINDINGS: Lungs are clear.  No pleural effusion or pneumothorax. The heart is normal in size.  Thoracic aortic atherosclerosis. IMPRESSION: No acute cardiopulmonary disease. Electronically Signed   By: Charline Bills M.D.   On: 03/25/2023 00:34    Scheduled Meds:  [START  ON 03/26/2023] aspirin EC  81 mg Oral Daily   atorvastatin  80 mg Oral Daily   cloNIDine  0.2 mg Oral BID   finasteride  5 mg Oral Daily   irbesartan  75 mg Oral Daily   levothyroxine  25 mcg Oral Q0600   metoprolol tartrate  25 mg Oral BID   montelukast  10 mg Oral QHS   pantoprazole  20 mg Oral BID   tamsulosin  0.4 mg Oral Daily   Continuous Infusions:  heparin 1,300 Units/hr (03/25/23 0133)   PRN Meds: acetaminophen, albuterol, fluticasone, magnesium hydroxide, morphine injection, ondansetron (ZOFRAN) IV, traZODone  Time spent: 35 minutes  Author: Gillis Santa. MD [facility] 03/25/2023 1:07 PM  To reach On-call, see care teams to locate the attending and reach out to them via [email or web address]. If 7PM-7AM, please contact night-coverage If you still have difficulty reaching the attending  provider, please page the [facility] (Director on Call) for [facility] on amion for assistance.

## 2023-03-26 ENCOUNTER — Other Ambulatory Visit (HOSPITAL_COMMUNITY): Payer: Self-pay

## 2023-03-26 DIAGNOSIS — I214 Non-ST elevation (NSTEMI) myocardial infarction: Secondary | ICD-10-CM | POA: Diagnosis not present

## 2023-03-26 LAB — ECHOCARDIOGRAM COMPLETE
AR max vel: 2.34 cm2
AV Area VTI: 2.65 cm2
AV Area mean vel: 2.22 cm2
AV Mean grad: 3 mmHg
AV Peak grad: 4.8 mmHg
Ao pk vel: 1.1 m/s
Area-P 1/2: 3.27 cm2
Height: 72 in
S' Lateral: 3.9 cm
Weight: 3360 [oz_av]

## 2023-03-26 LAB — BASIC METABOLIC PANEL
Anion gap: 8 (ref 5–15)
BUN: 15 mg/dL (ref 8–23)
CO2: 23 mmol/L (ref 22–32)
Calcium: 8.4 mg/dL — ABNORMAL LOW (ref 8.9–10.3)
Chloride: 105 mmol/L (ref 98–111)
Creatinine, Ser: 1.15 mg/dL (ref 0.61–1.24)
GFR, Estimated: >60 mL/min (ref >60)
Glucose, Bld: 89 mg/dL (ref 70–99)
Potassium: 3.6 mmol/L (ref 3.5–5.1)
Sodium: 136 mmol/L (ref 135–145)

## 2023-03-26 LAB — CBC
HCT: 40.6 % (ref 39.0–52.0)
Hemoglobin: 13.8 g/dL (ref 13.0–17.0)
MCH: 29.6 pg (ref 26.0–34.0)
MCHC: 34 g/dL (ref 30.0–36.0)
MCV: 86.9 fL (ref 80.0–100.0)
Platelets: 179 10*3/uL (ref 150–400)
RBC: 4.67 MIL/uL (ref 4.22–5.81)
RDW: 14.6 % (ref 11.5–15.5)
WBC: 8.6 10*3/uL (ref 4.0–10.5)
nRBC: 0 % (ref 0.0–0.2)

## 2023-03-26 LAB — T3, FREE: T3, Free: 2.9 pg/mL (ref 2.0–4.4)

## 2023-03-26 LAB — MAGNESIUM: Magnesium: 2.1 mg/dL (ref 1.7–2.4)

## 2023-03-26 LAB — CARDIAC CATHETERIZATION: Cath EF Quantitative: 50 %

## 2023-03-26 LAB — PHOSPHORUS: Phosphorus: 2.9 mg/dL (ref 2.5–4.6)

## 2023-03-26 LAB — LIPOPROTEIN A (LPA): Lipoprotein (a): 18.2 nmol/L (ref <75.0)

## 2023-03-26 MED ORDER — CLOPIDOGREL BISULFATE 75 MG PO TABS: 75.0000 mg | ORAL_TABLET | Freq: Every day | ORAL | Status: DC

## 2023-03-26 MED ORDER — CLOPIDOGREL BISULFATE 75 MG PO TABS
300.0000 mg | ORAL_TABLET | Freq: Once | ORAL | Status: AC
  Administered 2023-03-26: 300 mg via ORAL
  Filled 2023-03-26: qty 4

## 2023-03-26 MED ORDER — CLOPIDOGREL BISULFATE 75 MG PO TABS: 75.0000 mg | ORAL_TABLET | Freq: Every day | ORAL | 3 refills | Status: AC

## 2023-03-26 MED ORDER — APIXABAN 5 MG PO TABS: 5.0000 mg | ORAL_TABLET | Freq: Two times a day (BID) | ORAL | 3 refills | Status: AC

## 2023-03-26 MED ORDER — CLOPIDOGREL BISULFATE 75 MG PO TABS: 300.0000 mg | ORAL_TABLET | Freq: Once | ORAL | Status: DC

## 2023-03-26 MED ORDER — APIXABAN 5 MG PO TABS
5.0000 mg | ORAL_TABLET | Freq: Two times a day (BID) | ORAL | Status: DC
  Administered 2023-03-26: 5 mg via ORAL
  Filled 2023-03-26: qty 1

## 2023-03-26 MED ORDER — ASPIRIN 81 MG PO TBEC: 81.0000 mg | DELAYED_RELEASE_TABLET | Freq: Every day | ORAL | 0 refills | Status: AC

## 2023-03-26 MED ORDER — ATORVASTATIN CALCIUM 80 MG PO TABS: 80.0000 mg | ORAL_TABLET | Freq: Every day | ORAL | 11 refills | Status: AC

## 2023-03-26 NOTE — Discharge Summary (Signed)
[facility] Discharge Summary   Patient: Keith Craig. UUV:[id number]  PCP: Marguarite Arbour, MD  Date of admission: 03/24/2023   Date of discharge:  03/26/2023     Discharge Diagnoses:  Principal Problem:   NSTEMI (non-ST elevated myocardial infarction) [facility]) Active Problems:   Atrial fibrillation, chronic (HCC)   Hypothyroidism   Essential hypertension   Chronic obstructive pulmonary disease (COPD) (HCC)   BPH with obstruction/lower urinary tract symptoms   New onset atrial fibrillation (HCC)   Admitted From: Home Disposition:  Home   Recommendations for Outpatient Follow-up:  Follow with PCP in 1 week Follow-up with cardiology in 1 to 2 weeks Follow up LABS/TEST:     Follow-up Information     Dorothyann Peng D, MD. Go in 1 week(s).   Specialties: Cardiology, Internal Medicine Contact information: [address] [place] [address] [phone]                Diet recommendation: Cardiac diet  Activity: The patient is advised to gradually reintroduce usual activities, as tolerated  Discharge Condition: stable  Code Status: Full code   History of present illness: As per the H and P dictated on admission Hospital Course:  Keith Parvin. is a 77 y.o. Caucasian male with medical history significant for essential hypertension, GERD, COPD and asthma, BPH and osteoarthritis, who presented to the ER with acute onset of midsternal chest pain that started at 4 PM with radiation to his left arm with associated dyspnea, nausea, and diaphoresis without palpitations.    ED Course: When he came to the ER, BP was 160/95 with heart rate of 103 and otherwise normal vital signs.  Labs revealed a creatinine 1.25 and calcium of 8.4 and his high-sensitivity troponin was 497 and later 1098.  CBC was within normal.  TSH was 4.83 with normal free T4 of 1.05. EKG as reviewed by me : EKG showed atrial fibrillation with controlled ventricular sponsor of 88  with abnormal R wave progression and LVH and T wave inversion laterally. Imaging: Portable chest x-ray showed no acute cardiopulmonary disease.   The patient was given 500 mL IV normal saline bolus, IV heparin bolus and drip and 4 baby aspirin.  The patient dropped his pressure and heart rate after sublingual nitroglycerin when he was given 500 mL IV normal saline bolus.  He will be admitted to a progressive unit bed for further evaluation and management.     Assessment and Plan:   # NSTEMI (non-ST elevated myocardial infarction) (HCC) Trop 8854 elevated, s/p heparin IV infusion. Continue aspirin 80 mg p.o. daily, statin p.o. daily. S/p nitroglycerin PRN and Morphine PRN for pain control.  Cardiology was consulted, s/p PCI with DES to distal Lcx with resolution in chest pain while in the Cath Lab after stent placed on 03/25/2023.  Patient remained chest pain-free after the procedure.  Patient was started on DAPT aspirin and ticagrelor.  Due to new onset A-fib patient was started on Plavix 75 mg p.o. daily, aspirin 81 mg p.o. daily for 2 weeks and DOAC.  TTE LVEF 50 to 55%, no regional wall motion abnormality.  No any other significant findings.  Patient was cleared by cardiology to discharge home and follow-up as an outpatient. # Atrial fibrillation, chronic: He has been having occasional RVR. TSH 4.8 slightly elevated, free T4 1.05 Within normal range Lopressor 25 mg p.o. twice daily. S/p heparin IV infusion, transitioned to Eliquis 5 mg p.o. twice daily  # Essential  hypertension: continue irbesartan 75 mg p.o. daily, clonidine 0.2 mg p.o. twice daily and Lopressor 25 mg p.o. twice daily.  # Hypothyroidism: TSH 4.8, slightly elevated, Patient was on Synthroid 25 mcg p.o. daily, may need to increase Synthroid to 50 mcg p.o. daily. Follow with PCP to repeat TSH level after 4 to 6 weeks and titrate dose of Synthroid accordingly. # Chronic obstructive pulmonary disease (COPD)  Continue albuterol as needed,  patient was on Advair inhaler which was switched to [employer] inhaler. No COPD exacerbation on exam, continue to monitor. # BPH with obstruction/lower urinary tract symptoms continued Flomax and Proscar.   Body mass index is 28.48 kg/m.  Nutrition Interventions:  Patient was ambulatory without any assistance. On the day of the discharge the patient's vitals were stable, and no other acute medical condition were reported by patient. the patient was felt safe to be discharge at Home.  Consultants: Cardiologist Procedures: s/p PCI with DES to distal Lcx with resolution in chest pain while in the Cath Lab after stent placed on 03/25/2023  Discharge Exam: General: Appear in no distress, no Rash; Oral Mucosa Clear, moist. Cardiovascular: S1 and S2 Present, no Murmur, Respiratory: normal respiratory effort, Bilateral Air entry present and no Crackles, no wheezes Abdomen: Bowel Sound present, Soft and no tenderness, no hernia Extremities: no Pedal edema, no calf tenderness Neurology: alert and oriented to time, place, and person affect appropriate.  Filed Weights   03/24/23 2345 03/25/23 1551  Weight: 95.3 kg 95.3 kg   Vitals:   03/26/23 0855 03/26/23 1152  BP: (!) 145/93 (!) 148/91  Pulse: 68 70  Resp: 20 20  Temp: 98 F (36.7 C) 98.7 F (37.1 C)  SpO2: 97% 99%    DISCHARGE MEDICATION: Allergies as of 03/26/2023       Reactions   Bactrim [sulfamethoxazole-trimethoprim] Nausea And Vomiting   Nitroglycerin Other (See Comments)   After 1 nitroglycerin tablet patient became extremely bradycardic and hypotensive   Vitamin B12 [cyanocobalamin] Itching, Rash   Injection, tolerates the tablets         Medication List     STOP taking these medications    baclofen 20 MG tablet Commonly known as: LIORESAL       TAKE these medications    albuterol 108 (90 Base) MCG/ACT inhaler Commonly known as: VENTOLIN HFA Inhale 2 puffs into the lungs every 6 (six) hours as needed  for wheezing or shortness of breath.   apixaban 5 MG Tabs tablet Commonly known as: ELIQUIS Take 1 tablet (5 mg total) by mouth 2 (two) times daily.   aspirin EC 81 MG tablet Take 1 tablet (81 mg total) by mouth daily for 14 days. Swallow whole. Start taking on: March 27, 2023   atorvastatin 80 MG tablet Commonly known as: LIPITOR Take 1 tablet (80 mg total) by mouth daily. Start taking on: March 27, 2023   cloNIDine 0.2 MG tablet Commonly known as: CATAPRES Take 0.2 mg by mouth 2 (two) times daily.   clopidogrel 75 MG tablet Commonly known as: PLAVIX Take 1 tablet (75 mg total) by mouth daily. Start taking on: March 27, 2023   finasteride 5 MG tablet Commonly known as: PROSCAR Take 1 tablet (5 mg total) by mouth daily.   fluticasone 50 MCG/ACT nasal spray Commonly known as: FLONASE Place 1 spray into both nostrils daily as needed for allergies or rhinitis.   Fluticasone-Salmeterol 500-50 MCG/DOSE Aepb Commonly known as: ADVAIR Inhale 1 puff into the lungs 2 (two)  times daily.   levothyroxine 25 MCG tablet Commonly known as: SYNTHROID Take 25 mcg by mouth daily.   metoprolol tartrate 25 MG tablet Commonly known as: LOPRESSOR Take 1 tablet by mouth daily.   montelukast 10 MG tablet Commonly known as: SINGULAIR Take 10 mg by mouth at bedtime.   pantoprazole 20 MG tablet Commonly known as: PROTONIX Take 20 mg by mouth 2 (two) times daily.   predniSONE 5 MG tablet Commonly known as: DELTASONE Take 5 mg by mouth daily with breakfast.   tamsulosin 0.4 MG Caps capsule Commonly known as: FLOMAX Take 1 capsule (0.4 mg total) by mouth daily.   valsartan 80 MG tablet Commonly known as: DIOVAN Take 80 mg by mouth 2 (two) times daily.       Allergies  Allergen Reactions   Bactrim [Sulfamethoxazole-Trimethoprim] Nausea And Vomiting   Nitroglycerin Other (See Comments)    After 1 nitroglycerin tablet patient became extremely bradycardic and hypotensive    Vitamin B12 [Cyanocobalamin] Itching and Rash    Injection, tolerates the tablets    Discharge Instructions     AMB Referral to Cardiac Rehabilitation - Phase II   Complete by: As directed    Diagnosis: NSTEMI   After initial evaluation and assessments completed: Virtual Based Care may be provided alone or in conjunction with Phase 2 Cardiac Rehab based on patient barriers.: Yes   Call MD for:   Complete by: As directed    Any bleeding or bruises   Call MD for:  difficulty breathing, headache or visual disturbances   Complete by: As directed    Call MD for:  extreme fatigue   Complete by: As directed    Call MD for:  persistant dizziness or light-headedness   Complete by: As directed    Call MD for:  severe uncontrolled pain   Complete by: As directed    Diet - low sodium heart healthy   Complete by: As directed    Discharge instructions   Complete by: As directed    Follow with PCP in 1 week Follow-up with cardiology in 1 to 2 weeks   Increase activity slowly   Complete by: As directed        The results of significant diagnostics from this hospitalization (including imaging, microbiology, ancillary and laboratory) are listed below for reference.    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   Dist Cx lesion is 90% stenosed.   Mid LAD lesion is 50% stenosed.   A drug-eluting stent was successfully placed using a STENT ONYX FRONTIER 2.5X12.   Post intervention, there is a 0% residual stenosis.   The left ventricular systolic function is normal.   LV end diastolic pressure is normal.   The left ventricular ejection fraction is 50-55% by visual estimate.   There is no mitral valve regurgitation.   In the absence of any other complications or medical issues, we expect the patient to be ready for discharge from an interventional cardiology perspective on 03/26/2023.   Recommend uninterrupted dual antiplatelet  therapy with Aspirin 81mg  daily and Ticagrelor 90mg  twice daily for a minimum of 12 months (ACS-Class I recommendation).   DG Chest Port 1 View  Result Date: 03/25/2023 CLINICAL DATA:  Chest pain, shortness of breath EXAM: PORTABLE CHEST 1 VIEW COMPARISON:  CT chest dated 10/10/2020 FINDINGS: Lungs are clear.  No pleural effusion or pneumothorax. The heart is normal in size.  Thoracic aortic atherosclerosis.  IMPRESSION: No acute cardiopulmonary disease. Electronically Signed   By: Charline Bills M.D.   On: 03/25/2023 00:34    Microbiology: No results found for this or any previous visit (from the past 240 hour(s)).   Labs: CBC: Recent Labs  Lab 03/25/23 0014 03/25/23 0411 03/26/23 0518  WBC 7.4 7.6 8.6  HGB 13.9 13.4 13.8  HCT 42.0 39.2 40.6  MCV 88.2 85.2 86.9  PLT 194 193 179   Basic Metabolic Panel: Recent Labs  Lab 03/25/23 0014 03/25/23 0411 03/25/23 0911 03/26/23 0518  NA 138 138  --  136  K 3.6 3.8  --  3.6  CL 105 108  --  105  CO2 23 25  --  23  GLUCOSE 109* 109*  --  89  BUN 20 18  --  15  CREATININE 1.25* 1.22  --  1.15  CALCIUM 8.4* 8.2*  --  8.4*  MG 2.0  --  2.1 2.1  PHOS  --   --  3.0 2.9   Liver Function Tests: No results for input(s): "AST", "ALT", "ALKPHOS", "BILITOT", "PROT", "ALBUMIN" in the last 168 hours. No results for input(s): "LIPASE", "AMYLASE" in the last 168 hours. No results for input(s): "AMMONIA" in the last 168 hours. Cardiac Enzymes: No results for input(s): "CKTOTAL", "CKMB", "CKMBINDEX", "TROPONINI" in the last 168 hours. BNP (last 3 results) No results for input(s): "BNP" in the last 8760 hours. CBG: No results for input(s): "GLUCAP" in the last 168 hours.  Time spent: 35 minutes  Signed:  Gillis Santa  [facility] 03/26/2023 2:42 PM

## 2023-03-26 NOTE — Progress Notes (Signed)
[facility] CLINIC CARDIOLOGY CONSULT NOTE       Patient ID: Keith Craig. MRN: [id number] DOB/AGE: 1946/05/13 77 y.o.  Admit date: 03/24/2023 Referring Physician Dr. Valente David  Primary Physician Dr. Judithann Sheen  Primary Cardiologist previous Dr. Lady Gary (last seen 2018)  Reason for Consultation NSTEMI  HPI: Keith Craig. Keith Craig. Is a 77yoM with a PMH of COPD, HTN, nonobstructive CAD by LHC (2009), who presented to [facility] ED 03/24/2023 with  dull/nagging central chest pain and associated shortness of breath, nausea, and diaphoresis, worsened with exertion.  Was found to be in AF per EMS with rates in the 50s to 140s.  EKGs show lateral ST depressions and troponins elevated to a peak of 16,348, ruling in NSTEMI.  Cardiology is consulted for further assistance.  Interval History;  - s/p LHC yesterday PM s/p PCI with DES to distal Lcx with resolution in chest pain while in the Cath Lab after stent placed -Feels well without recurrence and chest discomfort this morning.  Denies shortness of breath, lightheadedness or dizziness, lower extremity edema -Remains in sinus rhythm with rate in the 60s to 70s without telemetry evidence of recurrent atrial fibrillation overnight  Review of systems complete and found to be negative unless listed above     Past Medical History:  Diagnosis Date   Arthritis    Benign essential microscopic hematuria    BPH with obstruction/lower urinary tract symptoms    Chills    Chronic airway obstruction (HCC)    Chronic obstructive asthma    Esophageal reflux    HTN (hypertension)    Over weight     Past Surgical History:  Procedure Laterality Date   cataract surgery Bilateral    CHOLECYSTECTOMY     CYSTOSCOPY W/ RETROGRADES Right 06/25/2020   Procedure: CYSTOSCOPY WITH RETROGRADE PYELOGRAM;  Surgeon: Vanna Scotland, MD;  Location: [facility] ORS;  Service: Urology;  Laterality: Right;   CYSTOSCOPY WITH DIRECT VISION INTERNAL URETHROTOMY N/A 06/25/2020   Procedure:  CYSTOSCOPY WITH DIRECT VISION INTERNAL URETHROTOMY;  Surgeon: Vanna Scotland, MD;  Location: [facility] ORS;  Service: Urology;  Laterality: N/A;   CYSTOSCOPY WITH RETROGRADE URETHROGRAM N/A 06/25/2020   Procedure: CYSTOSCOPY WITH RETROGRADE URETHROGRAM;  Surgeon: Vanna Scotland, MD;  Location: [facility] ORS;  Service: Urology;  Laterality: N/A;   CYSTOSCOPY WITH URETHRAL DILATATION N/A 06/25/2020   Procedure: CYSTOSCOPY WITH URETHRAL DILATATION;  Surgeon: Vanna Scotland, MD;  Location: [facility] ORS;  Service: Urology;  Laterality: N/A;   HIATAL HERNIA REPAIR     microvascular decompression of left trigeminal nerve     RHIZOTOMY     small bowel restrictions     URETHRAL STRICTURE DILATATION      Medications Prior to Admission  Medication Sig Dispense Refill Last Dose   albuterol (PROVENTIL HFA;VENTOLIN HFA) 108 (90 BASE) MCG/ACT inhaler Inhale 2 puffs into the lungs every 6 (six) hours as needed for wheezing or shortness of breath.   prn at prn   cloNIDine (CATAPRES) 0.2 MG tablet Take 0.2 mg by mouth 2 (two) times daily.   Unknown at Unknown   fluticasone (FLONASE) 50 MCG/ACT nasal spray Place 1 spray into both nostrils daily as needed for allergies or rhinitis.   prn at prn   Fluticasone-Salmeterol (ADVAIR) 500-50 MCG/DOSE AEPB Inhale 1 puff into the lungs 2 (two) times daily.   prn at prn   levothyroxine (SYNTHROID) 25 MCG tablet Take 25 mcg by mouth daily.   Past Month at Unknown   metoprolol tartrate (LOPRESSOR) 25  MG tablet Take 1 tablet by mouth daily.   Unknown at Unknown   montelukast (SINGULAIR) 10 MG tablet Take 10 mg by mouth at bedtime.    Unknown at Unknown   pantoprazole (PROTONIX) 20 MG tablet Take 20 mg by mouth 2 (two) times daily.   Unknown at Unknown   predniSONE (DELTASONE) 5 MG tablet Take 5 mg by mouth daily with breakfast.   Unknown at Unknown   tamsulosin (FLOMAX) 0.4 MG CAPS capsule Take 1 capsule (0.4 mg total) by mouth daily. 90 capsule 3 Unknown at Unknown   valsartan (DIOVAN)  80 MG tablet Take 80 mg by mouth 2 (two) times daily.    Unknown at Unknown   baclofen (LIORESAL) 20 MG tablet Take by mouth. (Patient not taking: Reported on 03/25/2023)   Not Taking   finasteride (PROSCAR) 5 MG tablet Take 1 tablet (5 mg total) by mouth daily. 90 tablet 3 Unknown at Unknown   Social History   Socioeconomic History   Marital status: Married    Spouse name: Not on file   Number of children: Not on file   Years of education: Not on file   Highest education level: Not on file  Occupational History   Not on file  Tobacco Use   Smoking status: Former    Current packs/day: 0.00    Types: Cigarettes    Quit date: 1971    Years since quitting: 53.6   Smokeless tobacco: Never  Vaping Use   Vaping status: Never Used  Substance and Sexual Activity   Alcohol use: No    Alcohol/week: 0.0 standard drinks of alcohol   Drug use: No   Sexual activity: Not on file  Other Topics Concern   Not on file  Social History Narrative   Caffeine use 2 servings per day.   Social Determinants of Health   Financial Resource Strain: Low Risk  (02/16/2023)   Received from [facility]   Overall Financial Resource Strain (CARDIA)    Difficulty of Paying Living Expenses: Not hard at all  Food Insecurity: No Food Insecurity (03/25/2023)   Hunger Vital Sign    Worried About Running Out of Food in the Last Year: Never true    Ran Out of Food in the Last Year: Never true  Transportation Needs: No Transportation Needs (03/25/2023)   PRAPARE - Administrator, Civil Service (Medical): No    Lack of Transportation (Non-Medical): No  Physical Activity: Not on file  Stress: Not on file  Social Connections: Unknown (03/16/2023)   Received from [facility]   Social Network    Social Network: Not on file  Intimate Partner Violence: Not At Risk (03/25/2023)   Humiliation, Afraid, Rape, and Kick questionnaire    Fear of Current or Ex-Partner: No    Emotionally Abused: No     Physically Abused: No    Sexually Abused: No    Family History  Problem Relation Age of Onset   Esophagitis Mother    Arthritis/Rheumatoid Father    Kidney disease Neg Hx    Prostate cancer Neg Hx    Kidney cancer Neg Hx    Bladder Cancer Neg Hx       Intake/Output Summary (Last 24 hours) at 03/26/2023 0833 Last data filed at 03/26/2023 0610 Gross per 24 hour  Intake 265 ml  Output 1475 ml  Net -1210 ml    Vitals:   03/25/23 2000 03/25/23 2015 03/25/23 2111 03/26/23 0248  BP: (!) 146/86 (!) 145/95 (!) 158/95 138/89  Pulse: (!) 57 (!) 56 63 64  Resp: (!) 23 13 20 18   Temp:   98.7 F (37.1 C) 99 F (37.2 C)  TempSrc:   Oral Oral  SpO2: 94% 96% 100% 99%  Weight:      Height:        PHYSICAL EXAM General: Pleasant, conversational elderly gentleman, well nourished, in no acute distress.  Sitting upright in bed in PCU.  Daughter present at bedside.  Scattered areas of ecchymosis on patient's upper arms, frail skin. HEENT:  Normocephalic and atraumatic. Neck:  No JVD.  Lungs: Normal respiratory effort on room air.  Decreased breath sounds without appreciable crackles or wheezes.   Heart: HRRR . Normal S1 and S2 without gallops or murmurs.  Abdomen: Non-distended appearing with excess adiposity.  Msk: Normal strength and tone for age. Extremities: Warm and well perfused.  No peripheral edema.  Right wrist with well-healed arteriotomy with trace ecchymosis without active bleeding, oozing, or tenderness to palpation.  Radial pulse 2+ proximally and distally from arteriotomy. Neuro: Alert and oriented X 3. Psych:  Answers questions appropriately.   Labs: Basic Metabolic Panel: Recent Labs    03/25/23 0411 03/25/23 0911 03/26/23 0518  NA 138  --  136  K 3.8  --  3.6  CL 108  --  105  CO2 25  --  23  GLUCOSE 109*  --  89  BUN 18  --  15  CREATININE 1.22  --  1.15  CALCIUM 8.2*  --  8.4*  MG  --  2.1 2.1  PHOS  --  3.0 2.9   Liver Function Tests: No results for  input(s): "AST", "ALT", "ALKPHOS", "BILITOT", "PROT", "ALBUMIN" in the last 72 hours. No results for input(s): "LIPASE", "AMYLASE" in the last 72 hours. CBC: Recent Labs    03/25/23 0411 03/26/23 0518  WBC 7.6 8.6  HGB 13.4 13.8  HCT 39.2 40.6  MCV 85.2 86.9  PLT 193 179   Cardiac Enzymes: Recent Labs    03/25/23 0911 03/25/23 1303 03/25/23 1826  TROPONINIHS 6,991* 1,610* 16,348*   BNP: No results for input(s): "BNP" in the last 72 hours. D-Dimer: Recent Labs    03/25/23 0014  DDIMER 0.50   Hemoglobin A1C: No results for input(s): "HGBA1C" in the last 72 hours. Fasting Lipid Panel: Recent Labs    03/25/23 0411  CHOL 144  HDL 54  LDLCALC 84  TRIG 32  CHOLHDL 2.7   Thyroid Function Tests: Recent Labs    03/25/23 0014 03/25/23 0911  TSH 4.839*  --   T3FREE  --  2.9   Anemia Panel: Recent Labs    03/25/23 0911  VITAMINB12 406     Radiology: CARDIAC CATHETERIZATION  Result Date: 03/25/2023   RPAV lesion is 100% stenosed.   Dist Cx lesion is 90% stenosed.   Mid LAD lesion is 50% stenosed.   A drug-eluting stent was successfully placed using a STENT ONYX FRONTIER 2.5X12.   Post intervention, there is a 0% residual stenosis.   The left ventricular systolic function is normal.   LV end diastolic pressure is normal.   The left ventricular ejection fraction is 50-55% by visual estimate.   There is no mitral valve regurgitation.   In the absence of any other complications or medical issues, we expect the patient to be ready for discharge from an interventional cardiology perspective on 03/26/2023.   Recommend uninterrupted dual antiplatelet therapy  with Aspirin 81mg  daily and Ticagrelor 90mg  twice daily for a minimum of 12 months (ACS-Class I recommendation).   DG Chest Port 1 View  Result Date: 03/25/2023 CLINICAL DATA:  Chest pain, shortness of breath EXAM: PORTABLE CHEST 1 VIEW COMPARISON:  CT chest dated 10/10/2020 FINDINGS: Lungs are clear.  No pleural effusion  or pneumothorax. The heart is normal in size.  Thoracic aortic atherosclerosis. IMPRESSION: No acute cardiopulmonary disease. Electronically Signed   By: Charline Bills M.D.   On: 03/25/2023 00:34    ECHO 09/20/2020 DOPPLER ECHO and OTHER SPECIAL PROCEDURES                 Aortic: No AR                      No AS                         141.2 cm/sec peak vel      8.0 mmHg peak grad                 Mitral: TRIVIAL MR                 No MS                         MV Inflow E Vel = 83.1 cm/sec     MV Annulus E'Vel = 4.1 cm/sec                         E/E'Ratio = 20.3              Tricuspid: MILD TR                    No TS                         249.8 cm/sec peak TR vel   28.0 mmHg peak RV pressure              Pulmonary: TRIVIAL PR                 No PS                         116.7 cm/sec peak vel      5.5 mmHg peak grad  _________________________________________________________________________________________  INTERPRETATION  NORMAL LEFT VENTRICULAR SYSTOLIC FUNCTION  NORMAL RIGHT VENTRICULAR SYSTOLIC FUNCTION  MILD VALVULAR REGURGITATION (See above)  NO VALVULAR STENOSIS  TRIVIAL MR, PR  MILD TR  EF 60%  Closest EF: >55% (Estimated)  Mitral: TRIVIAL MR  Tricuspid: MILD TR   Electronically signed by            MD Mariel Kansky on 09/21/2020 01: 13 PM           Performed By: Merla Riches, RDCS     Ordering Physician: Judithann Sheen, JEFFREY   TELEMETRY reviewed by me (LT) 03/26/2023 : NSR rate 70s to 80s, 45-minute period of AF RVR with rate 120s to 130s earlier a.m. while in the PCU  EKG reviewed by me: NSR with lateral ST depressions, inferior TWI.  Data reviewed by me (LT) 03/26/2023: ED note, admission H&P, last outpatient cardiology note last 24h vitals tele labs imaging I/O   Principal Problem:   NSTEMI (non-ST elevated myocardial infarction) (HCC)  Active Problems:   BPH with obstruction/lower urinary tract symptoms   Atrial fibrillation, chronic (HCC)   Hypothyroidism    Essential hypertension   Chronic obstructive pulmonary disease (COPD) (HCC)   New onset atrial fibrillation (HCC)    ASSESSMENT AND PLAN:  Jamarion Rieker. Keith Craig. Is a 77yoM with a PMH of COPD, HTN, nonobstructive CAD by LHC (2009), who presented to [facility] ED 03/24/2023 with  dull/nagging central chest pain and associated shortness of breath, nausea, and diaphoresis, worsened with exertion.  Was found to be in AF per EMS with rates in the 50s to 140s.  EKGs show lateral ST depressions and troponins elevated to a peak of 6991, ruling in NSTEMI.  Cardiology is consulted for further assistance.  # NSTEMI # S/p PCI with DES to distal LCx 03/25/2023 Presents with dull, nagging central chest pressure that radiates out to the left and right sides of his chest, associated with shortness of breath, nausea, and diaphoresis, improving with IV morphine.  Became very bradycardic and hypotensive with SL nitroglycerin x 1.  EKGs with inferolateral abnormalities, and troponin  trending for 97, 1098, 6991, 8854 and post procedurally 16,348.  S/p successful PCI with DES to the distal LCx with resolution of chest pain. -S/p aspirin load PTA -S/p heparin drip. -Change ticagrelor to clopidogrel for decreased bleeding risk with DOAC initiation as below.  Give 300 mg clopidogrel x 1 today, then start 75 mg clopidogrel daily starting tomorrow, 8/16. -Triple therapy with aspirin 81 mg daily, clopidogrel 75 mg daily, and Eliquis 5 mg twice daily x 2 weeks, then stop aspirin and continue clopidogrel and Eliquis. -Atorvastatin 80 mg daily -Metoprolol tartrate 25 mg daily -Irbesartan 75 mg daily -check A1C. LDL above goal at at 84 -Defer nitrates with profound bradycardia/hypotension -Echo complete, pending formal read.  LV gram with estimate LVEF 50-55%.  # New paroxysmal AF In AF when EMS arrived with rates in the 50s-140s, predominantly in NSR on telemetry today with a 45-minute period of AF with rates 120s to 130s earlier  a.m.  TSH borderline elevated at 4.8, free T4 within normal limits. -Change heparin to Eliquis as above for stroke prevention.  CHA2DS2-VASc 4 (age, HTN, CAD). - metoprolol tartrate for rate control   Ok for discharge today from a cardiac perspective. Will arrange for follow up in clinic with Dr. Juliann Pares in 1-2 weeks.    This patient's plan of care was discussed and created with Dr. Juliann Pares and he is in agreement.  Signed: Rebeca Allegra , PA-C 03/26/2023, 8:33 AM [facility]

## 2023-03-26 NOTE — TOC Benefit Eligibility Note (Signed)
Patient Product/process development scientist completed.    The patient is insured through [employer]. Patient has Medicare and is not eligible for a copay card, but may be able to apply for patient assistance, if available.    Ran test claim for Eliquis 5 mg and the current 30 day co-pay is $47.00.   This test claim was processed through [facility]- copay amounts may vary at other pharmacies due to pharmacy/plan contracts, or as the patient moves through the different stages of their insurance plan.     Roland Earl, CPHT Pharmacy Patient Advocate Specialist [facility] Patient Advocate Team Direct Number: [phone]  Fax: [phone]

## 2023-03-27 ENCOUNTER — Encounter: Payer: Self-pay | Admitting: Internal Medicine

## 2023-03-27 LAB — HEMOGLOBIN A1C
Hgb A1c MFr Bld: 5.8 % — ABNORMAL HIGH (ref 4.8–5.6)
Mean Plasma Glucose: 120 mg/dL

## 2023-04-15 ENCOUNTER — Other Ambulatory Visit: Payer: Self-pay | Admitting: Neurology

## 2023-04-15 DIAGNOSIS — R5383 Other fatigue: Secondary | ICD-10-CM

## 2023-04-15 DIAGNOSIS — R2689 Other abnormalities of gait and mobility: Secondary | ICD-10-CM

## 2023-04-15 DIAGNOSIS — R42 Dizziness and giddiness: Secondary | ICD-10-CM

## 2023-04-15 DIAGNOSIS — H539 Unspecified visual disturbance: Secondary | ICD-10-CM

## 2023-04-21 ENCOUNTER — Ambulatory Visit
Admission: RE | Admit: 2023-04-21 | Discharge: 2023-04-21 | Disposition: A | Discharge status: Home / Self Care | Payer: Medicare Other | Source: Ambulatory Visit | Attending: Neurology | Admitting: Neurology

## 2023-04-21 DIAGNOSIS — R5383 Other fatigue: Secondary | ICD-10-CM | POA: Diagnosis present

## 2023-04-21 DIAGNOSIS — R2689 Other abnormalities of gait and mobility: Secondary | ICD-10-CM | POA: Insufficient documentation

## 2023-04-21 DIAGNOSIS — H539 Unspecified visual disturbance: Secondary | ICD-10-CM | POA: Insufficient documentation

## 2023-04-21 DIAGNOSIS — R42 Dizziness and giddiness: Secondary | ICD-10-CM | POA: Insufficient documentation

## 2024-02-25 ENCOUNTER — Ambulatory Visit: Payer: Self-pay | Admitting: Urology

## 2024-02-25 DIAGNOSIS — R351 Nocturia: Secondary | ICD-10-CM

## 2024-03-25 ENCOUNTER — Ambulatory Visit: Admitting: Urology

## 2024-03-30 NOTE — Progress Notes (Signed)
 04/01/2024 8:26 PM   Lynwood VEAR Jyl Teddie 11/14/1945 [id number]  Referring provider: Auston Reyes BIRCH, MD [address] [facility] [place],  [place] [address]   Urological history: 1. 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 BPH with LU TS PSA (03/2024)  0.56 Tamsulosin  0.4 mg daily and finasteride  5 mg daily  3. 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 No hydronephrosis. No suspicious renal cortical masses. No evidence of urothelial lesions, with limitations as described.  Mild diffuse bladder wall thickening and trabeculation with small left bladder wall diverticulum, suggesting chronic bladder outlet obstruction cysto with strictures 2021 Non contrast CT 2022 - benign renal cyst  cysto 2022 - false passages, friable prostate   4. Renal cyst non-contrast 07/2021 6 cm in left kidney  Chief Complaint  Patient presents with   Follow-up   Urethral Stricture    HPI: MUKHTAR SHAMS Jr.is a 78 y.o.  man who presents today for one year follow up.  Previous records reviewed.     His only urinary complaint is nocturia x 2.  He otherwise states he is voiding with a strong urinary stream and feels that he is emptying his bladder.  Patient denies any modifying or aggravating factors.  Patient denies any recent UTI's, gross hematuria, dysuria or suprapubic/flank pain.  Patient denies any fevers, chills, nausea or vomiting.    Serum creatinine 1.1 with a EGFR of 69 in May.   Urinalysis in May was positive for budding yeast, 52 WBCs and 12 RBCs.  Urine culture was negative.  Another urinalysis in February was positive for 70 WBCs and 20 RBCs.  Today's UA positive for greater than 30 WBCs, 3-10 RBCs and a few bacteria.  He smoked for about 6 months while he was in the [employer].  The reason he did this is that smokers received an extra break.     PMH: Past Medical History:   Diagnosis Date   Arthritis    Benign essential microscopic hematuria    BPH with obstruction/lower urinary tract symptoms    Chills    Chronic airway obstruction (HCC)    Chronic obstructive asthma    Esophageal reflux    HTN (hypertension)    Over weight     Surgical History: Past Surgical History:  Procedure Laterality Date   cataract surgery Bilateral    CHOLECYSTECTOMY     CORONARY STENT INTERVENTION N/A 03/25/2023   Procedure: CORONARY STENT INTERVENTION;  Surgeon: Florencio Cara BIRCH, MD;  Location: [facility] INVASIVE CV LAB;  Service: Cardiovascular;  Laterality: N/A;   CYSTOSCOPY W/ RETROGRADES Right 06/25/2020   Procedure: CYSTOSCOPY WITH RETROGRADE PYELOGRAM;  Surgeon: Penne Knee, MD;  Location: [facility] ORS;  Service: Urology;  Laterality: Right;   CYSTOSCOPY WITH DIRECT VISION INTERNAL URETHROTOMY N/A 06/25/2020   Procedure: CYSTOSCOPY WITH DIRECT VISION INTERNAL URETHROTOMY;  Surgeon: Penne Knee, MD;  Location: [facility] ORS;  Service: Urology;  Laterality: N/A;   CYSTOSCOPY WITH RETROGRADE URETHROGRAM N/A 06/25/2020   Procedure: CYSTOSCOPY WITH RETROGRADE URETHROGRAM;  Surgeon: Penne Knee, MD;  Location: [facility] ORS;  Service: Urology;  Laterality: N/A;   CYSTOSCOPY WITH URETHRAL DILATATION N/A 06/25/2020   Procedure: CYSTOSCOPY WITH URETHRAL DILATATION;  Surgeon: Penne Knee, MD;  Location: [facility] ORS;  Service: Urology;  Laterality: N/A;   HIATAL HERNIA REPAIR     LEFT HEART CATH AND CORONARY ANGIOGRAPHY N/A 03/25/2023   Procedure: LEFT HEART CATH AND CORONARY  ANGIOGRAPHY possible PCI stent;  Surgeon: Florencio Cara BIRCH, MD;  Location: [facility] INVASIVE CV LAB;  Service: Cardiovascular;  Laterality: N/A;   microvascular decompression of left trigeminal nerve     RHIZOTOMY     small bowel restrictions     URETHRAL STRICTURE DILATATION      Home Medications:  Allergies as of 04/01/2024       Reactions   Bactrim  [sulfamethoxazole -trimethoprim ] Nausea And Vomiting    Nitroglycerin  Other (See Comments)   After 1 nitroglycerin  tablet patient became extremely bradycardic and hypotensive   Vitamin B12 [cyanocobalamin] Itching, Rash   Injection, tolerates the tablets         Medication List        Accurate as of April 01, 2024 11:59 PM. If you have any questions, ask your nurse or doctor.          albuterol  108 (90 Base) MCG/ACT inhaler Commonly known as: VENTOLIN  HFA Inhale 2 puffs into the lungs every 6 (six) hours as needed for wheezing or shortness of breath.   apixaban  5 MG Tabs tablet Commonly known as: ELIQUIS  Take 1 tablet (5 mg total) by mouth 2 (two) times daily.   atorvastatin  80 MG tablet Commonly known as: LIPITOR  Take 1 tablet (80 mg total) by mouth daily.   cloNIDine  0.2 MG tablet Commonly known as: CATAPRES  Take 0.2 mg by mouth 2 (two) times daily.   finasteride  5 MG tablet Commonly known as: PROSCAR  Take 1 tablet (5 mg total) by mouth daily.   fluticasone  50 MCG/ACT nasal spray Commonly known as: FLONASE  Place 1 spray into both nostrils daily as needed for allergies or rhinitis.   Fluticasone -Salmeterol 500-50 MCG/DOSE Aepb Commonly known as: ADVAIR Inhale 1 puff into the lungs 2 (two) times daily.   levothyroxine  25 MCG tablet Commonly known as: SYNTHROID  Take 25 mcg by mouth daily.   metoprolol  tartrate 25 MG tablet Commonly known as: LOPRESSOR  Take 1 tablet by mouth daily.   montelukast  10 MG tablet Commonly known as: SINGULAIR  Take 10 mg by mouth at bedtime.   pantoprazole  20 MG tablet Commonly known as: PROTONIX  Take 20 mg by mouth 2 (two) times daily.   predniSONE  5 MG tablet Commonly known as: DELTASONE  Take 5 mg by mouth daily with breakfast.   tamsulosin  0.4 MG Caps capsule Commonly known as: FLOMAX  Take 1 capsule (0.4 mg total) by mouth daily.   valsartan 80 MG tablet Commonly known as: DIOVAN Take 80 mg by mouth 2 (two) times daily.        Allergies:  Allergies  Allergen  Reactions   Bactrim  [Sulfamethoxazole -Trimethoprim ] Nausea And Vomiting   Nitroglycerin  Other (See Comments)    After 1 nitroglycerin  tablet patient became extremely bradycardic and hypotensive   Vitamin B12 [Cyanocobalamin] Itching and Rash    Injection, tolerates the tablets     Family History: Family History  Problem Relation Age of Onset   Esophagitis Mother    Arthritis/Rheumatoid Father    Kidney disease Neg Hx    Prostate cancer Neg Hx    Kidney cancer Neg Hx    Bladder Cancer Neg Hx     Social History:  reports that he quit smoking about 54 years ago. His smoking use included cigarettes. He has never used smokeless tobacco. He reports that he does not drink alcohol and does not use drugs.  ROS: Pertinent ROS in HPI  Physical Exam: BP (!) 148/75 (BP Location: Left Arm, Patient Position: Sitting, Cuff Size: Normal)  Pulse 67   Ht 6' (1.829 m)   Wt 200 lb (90.7 kg)   SpO2 98%   BMI 27.12 kg/m   Constitutional:  Well nourished. Alert and oriented, No acute distress. HEENT: [place] AT, moist mucus membranes.  Trachea midline Cardiovascular: No clubbing, cyanosis, or edema. Respiratory: Normal respiratory effort, no increased work of breathing. Neurologic: Grossly intact, no focal deficits, moving all 4 extremities. Psychiatric: Normal mood and affect.   Laboratory Data: See EPIC and HPI  I have reviewed the labs.   Pertinent Imaging: N/A   Assessment & Plan:   1. Urethral strictures -He is not having any symptoms that would indicate recurrence of his stricture  2. BPH with LUTS -PSA stable -continue conservative management, avoiding bladder irritants and timed voiding's -continue tamsulosin  0.4 mg daily and finasteride  5 mg daily  3. High risk hematuria - former smoker (6 months)  -work up completed in 2021 - notable for urethral strictures -non- contrast CT 2022 - right benign renal cyst -cysto 2022 - friable prostatic mucosa with bulbar urethral  stricture -no reports of gross heme -His last 2 urinalysis with his PCP had large amount of microscopic hematuria and today's UA w/ micro heme as well  -we discussed that he is having more episode of micro heme on his last few UA's and it has been three years since his last workup, we discussed that the micro heme may be the result of BPH or stricture disease and less likely malignancy, but his chances are not zero - he would like to repeat the work up, so we will get him scheduled for a renal ultrasound and cysto    Return for RUS report and cysto for  microscopic hematuria .  These notes generated with voice recognition software. I apologize for typographical errors.  CLOTILDA HELON RIGGERS  [facility] [address]  [address] [place], [place] [address] [phone]

## 2024-04-01 ENCOUNTER — Ambulatory Visit (INDEPENDENT_AMBULATORY_CARE_PROVIDER_SITE_OTHER): Admitting: Urology

## 2024-04-01 VITALS — BP 148/75 | HR 67 | Ht 72.0 in | Wt 200.0 lb

## 2024-04-01 DIAGNOSIS — N35919 Unspecified urethral stricture, male, unspecified site: Secondary | ICD-10-CM

## 2024-04-01 DIAGNOSIS — N138 Other obstructive and reflux uropathy: Secondary | ICD-10-CM

## 2024-04-01 DIAGNOSIS — R351 Nocturia: Secondary | ICD-10-CM

## 2024-04-01 DIAGNOSIS — R3129 Other microscopic hematuria: Secondary | ICD-10-CM | POA: Diagnosis not present

## 2024-04-01 DIAGNOSIS — N401 Enlarged prostate with lower urinary tract symptoms: Secondary | ICD-10-CM

## 2024-04-01 LAB — URINALYSIS, COMPLETE
Bilirubin, UA: NEGATIVE
Glucose, UA: NEGATIVE
Ketones, UA: NEGATIVE
Nitrite, UA: NEGATIVE
Protein,UA: NEGATIVE
Specific Gravity, UA: 1.02 (ref 1.005–1.030)
Urobilinogen, Ur: 0.2 mg/dL (ref 0.2–1.0)
pH, UA: 6 (ref 5.0–7.5)

## 2024-04-01 LAB — MICROSCOPIC EXAMINATION: WBC, UA: >30 /HPF — AB (ref 0–5)

## 2024-04-01 MED ORDER — FINASTERIDE 5 MG PO TABS: 5.0000 mg | ORAL_TABLET | Freq: Every day | ORAL | 3 refills | Status: DC

## 2024-04-01 MED ORDER — TAMSULOSIN HCL 0.4 MG PO CAPS: 0.4000 mg | ORAL_CAPSULE | Freq: Every day | ORAL | 3 refills | Status: DC

## 2024-04-01 NOTE — Patient Instructions (Signed)
 We have ordered ultrasound of your kidneys and the scheduling department should reach out to you to schedule this appointment.  Sometimes, they have difficulty reaching patients because they are calling from an unknown number and many people have their phones set to ignore unknown numbers.  They do try to leave messages, but sometimes voicemails are not set up or mailboxes are full.  If you have not heard from the scheduling department in a week, please call [phone] to schedule your ultrasound of your kidneys.

## 2024-04-03 ENCOUNTER — Encounter: Payer: Self-pay | Admitting: Urology

## 2024-04-05 LAB — CULTURE, URINE COMPREHENSIVE

## 2024-04-15 ENCOUNTER — Ambulatory Visit
Admission: RE | Admit: 2024-04-15 | Discharge: 2024-04-15 | Disposition: A | Discharge status: Home / Self Care | Source: Ambulatory Visit | Attending: Urology | Admitting: Urology

## 2024-04-15 DIAGNOSIS — R3129 Other microscopic hematuria: Secondary | ICD-10-CM | POA: Diagnosis present

## 2024-04-27 ENCOUNTER — Ambulatory Visit: Payer: Self-pay | Admitting: Physician Assistant

## 2024-06-17 ENCOUNTER — Encounter: Payer: Self-pay | Admitting: Urology

## 2024-06-17 ENCOUNTER — Ambulatory Visit (INDEPENDENT_AMBULATORY_CARE_PROVIDER_SITE_OTHER): Admitting: Urology

## 2024-06-17 VITALS — BP 154/86 | HR 58 | Ht 72.0 in | Wt 204.0 lb

## 2024-06-17 DIAGNOSIS — N401 Enlarged prostate with lower urinary tract symptoms: Secondary | ICD-10-CM

## 2024-06-17 DIAGNOSIS — N138 Other obstructive and reflux uropathy: Secondary | ICD-10-CM

## 2024-06-17 LAB — URINALYSIS, COMPLETE
Bilirubin, UA: NEGATIVE
Glucose, UA: NEGATIVE
Ketones, UA: NEGATIVE
Nitrite, UA: NEGATIVE
Protein,UA: NEGATIVE
Specific Gravity, UA: 1.025 (ref 1.005–1.030)
Urobilinogen, Ur: 0.2 mg/dL (ref 0.2–1.0)
pH, UA: 6 (ref 5.0–7.5)

## 2024-06-17 LAB — MICROSCOPIC EXAMINATION

## 2024-06-17 MED ORDER — LIDOCAINE HCL URETHRAL/MUCOSAL 2 % EX GEL
1.0000 | Freq: Once | CUTANEOUS | Status: AC
  Administered 2024-06-17: 1 via URETHRAL

## 2024-06-17 NOTE — Progress Notes (Signed)
   06/17/24  CC:  Chief Complaint  Patient presents with   Cysto    HPI: Refer to [facility]'s prior note 04/01/2024.  Urine culture grew mixed flora.  No voiding complaints.  UA today dipstick 1+ leukocytes/microscopy 6-10 WBC, 0-2 RBC  Blood pressure (!) 154/86, pulse (!) 58, height 6' (1.829 m), weight 204 lb (92.5 kg), SpO2 98%.   Cystoscopy Procedure Note  Patient identification was confirmed, informed consent was obtained, and patient was prepped using Betadine solution.  Lidocaine  jelly was administered per urethral meatus.     Pre-Procedure: - Inspection reveals a normal caliber urethral meatus.  Procedure: The flexible cystoscope was introduced without difficulty - ~54F bulbar urethral stricture; unable to negotiate with the cystoscope   Assessment/ Plan: Findings were discussed with patient.  He has had no voiding complaints since his internal urethrotomy/stricture dilation He declines stricture dilation at this time His microhematuria was associated with significant pyuria on several UAs (>30 WBC) and RBCs would be expected and I do not think this is pathologic Follow-up 3 months with Clotilda for PVR Call earlier for development of voiding symptoms    Glendia JAYSON Barba, MD

## 2024-09-15 NOTE — Progress Notes (Unsigned)
 "    09/16/2024 2:30 PM   Keith Craig 31-Aug-1945 [id number]  Referring provider: Auston Reyes BIRCH, MD [address] [facility] [place],  [place] [address]   Urological history: 1. Urethral stricture - Balloon dilation of bulbar urethra 06/2020 - cysto 03/2021 - bulbar urethral stricture  2. BPH with LU TS - PSA (03/2024)  0.56 - Tamsulosin  0.4 mg daily and finasteride  5 mg daily  3. High risk hematuria - former smoker - CTU 2021 No urolithiasis. No hydronephrosis. No suspicious renal cortical masses. No evidence of urothelial lesions, with limitations as described.  Mild diffuse bladder wall thickening and trabeculation with small left bladder wall diverticulum, suggesting chronic bladder outlet obstruction - cysto with strictures 2021 - Non contrast CT 2022 - benign renal cyst  - cysto 2022 - false passages, friable prostate  - RUS (04/2024) NED - cysto (11/205) ~ 39f bulbar urethral stricture; unable to negotiate with cystoscope, declined going to the OR for dilation  4. Renal cyst - non-contrast 07/2021 6 cm in left kidney  No chief complaint on file.   HPI: Keith Craigis a 79 y.o.  man who presents today for 31-month follow-up for PVR.  Previous records reviewed.     He was having large amount of micro heme on several UA's and underwent a cysto for further evaluation and was found to have a 99f bulbar urethral stricture.  He declined further treatment.    I PSS ***  - Irritative symptoms: frequency, urgency, nocturia  ___*** - Obstructive symptoms: weak stream, hesitancy, intermittency, straining, incomplete emptying *** - Incontinence: none/post-void dribbling/urge incontinence/stress incontinence/mixed incontinence, and # of pads *** - Symptom progression: stable / worsening *** - Current therapy: ?-blocker? 5-ARI? (document) *** - Response to therapy: partial / good / inadequate *** - Denies: gross hematuria, dysuria, flank pain,  fever, chills, retention. *** - Quality of life: moderate bother from urinary symptoms ***  PVR***  PSA (03/2024) 0.56  Serum creatinine (06/2024) 1.2  PMH: Past Medical History:  Diagnosis Date   Arthritis    Benign essential microscopic hematuria    BPH with obstruction/lower urinary tract symptoms    Chills    Chronic airway obstruction (HCC)    Chronic obstructive asthma (HCC)    Esophageal reflux    HTN (hypertension)    Over weight     Surgical History: Past Surgical History:  Procedure Laterality Date   cataract surgery Bilateral    CHOLECYSTECTOMY     CORONARY STENT INTERVENTION N/A 03/25/2023   Procedure: CORONARY STENT INTERVENTION;  Surgeon: Florencio Cara BIRCH, MD;  Location: [facility];  Service: Cardiovascular;  Laterality: N/A;   CYSTOSCOPY W/ RETROGRADES Right 06/25/2020   Procedure: CYSTOSCOPY WITH RETROGRADE PYELOGRAM;  Surgeon: Penne Knee, MD;  Location: [facility];  Service: Urology;  Laterality: Right;   CYSTOSCOPY WITH DIRECT VISION INTERNAL URETHROTOMY N/A 06/25/2020   Procedure: CYSTOSCOPY WITH DIRECT VISION INTERNAL URETHROTOMY;  Surgeon: Penne Knee, MD;  Location: [facility];  Service: Urology;  Laterality: N/A;   CYSTOSCOPY WITH RETROGRADE URETHROGRAM N/A 06/25/2020   Procedure: CYSTOSCOPY WITH RETROGRADE URETHROGRAM;  Surgeon: Penne Knee, MD;  Location: [facility];  Service: Urology;  Laterality: N/A;   CYSTOSCOPY WITH URETHRAL DILATATION N/A 06/25/2020   Procedure: CYSTOSCOPY WITH URETHRAL DILATATION;  Surgeon: Penne Knee, MD;  Location: [facility];  Service: Urology;  Laterality: N/A;   HIATAL HERNIA REPAIR     LEFT HEART CATH AND CORONARY ANGIOGRAPHY N/A 03/25/2023  Procedure: LEFT HEART CATH AND CORONARY ANGIOGRAPHY possible PCI stent;  Surgeon: Florencio Cara BIRCH, MD;  Location: [facility];  Service: Cardiovascular;  Laterality: N/A;   microvascular decompression of left trigeminal nerve     RHIZOTOMY     small  bowel restrictions     URETHRAL STRICTURE DILATATION      Home Medications:  Allergies as of 09/16/2024       Reactions   Bactrim  [sulfamethoxazole -trimethoprim ] Nausea And Vomiting   Nitroglycerin  Other (See Comments)   After 1 nitroglycerin  tablet patient became extremely bradycardic and hypotensive   Vitamin B12 [cyanocobalamin] Itching, Rash   Injection, tolerates the tablets         Medication List        Accurate as of September 15, 2024  2:30 PM. If you have any questions, ask your nurse or doctor.          albuterol  108 (90 Base) MCG/ACT inhaler Commonly known as: VENTOLIN  HFA Inhale 2 puffs into the lungs every 6 (six) hours as needed for wheezing or shortness of breath.   apixaban  5 MG Tabs tablet Commonly known as: ELIQUIS  Take 1 tablet (5 mg total) by mouth 2 (two) times daily.   atorvastatin  80 MG tablet Commonly known as: LIPITOR  Take 1 tablet (80 mg total) by mouth daily.   cloNIDine  0.2 MG tablet Commonly known as: CATAPRES  Take 0.2 mg by mouth 2 (two) times daily.   finasteride  5 MG tablet Commonly known as: PROSCAR  Take 1 tablet (5 mg total) by mouth daily.   fluticasone  50 MCG/ACT nasal spray Commonly known as: FLONASE  Place 1 spray into both nostrils daily as needed for allergies or rhinitis.   Fluticasone -Salmeterol 500-50 MCG/DOSE Aepb Commonly known as: ADVAIR Inhale 1 puff into the lungs 2 (two) times daily.   levothyroxine  25 MCG tablet Commonly known as: SYNTHROID  Take 25 mcg by mouth daily.   metoprolol  tartrate 25 MG tablet Commonly known as: LOPRESSOR  Take 1 tablet by mouth daily.   montelukast  10 MG tablet Commonly known as: SINGULAIR  Take 10 mg by mouth at bedtime.   pantoprazole  20 MG tablet Commonly known as: PROTONIX  Take 20 mg by mouth 2 (two) times daily.   predniSONE  5 MG tablet Commonly known as: DELTASONE  Take 5 mg by mouth daily with breakfast.   tamsulosin  0.4 MG Caps capsule Commonly known as:  FLOMAX  Take 1 capsule (0.4 mg total) by mouth daily.   valsartan 80 MG tablet Commonly known as: DIOVAN Take 80 mg by mouth 2 (two) times daily.        Allergies:  Allergies  Allergen Reactions   Bactrim  [Sulfamethoxazole -Trimethoprim ] Nausea And Vomiting   Nitroglycerin  Other (See Comments)    After 1 nitroglycerin  tablet patient became extremely bradycardic and hypotensive   Vitamin B12 [Cyanocobalamin] Itching and Rash    Injection, tolerates the tablets     Family History: Family History  Problem Relation Age of Onset   Esophagitis Mother    Arthritis/Rheumatoid Father    Kidney disease Neg Hx    Prostate cancer Neg Hx    Kidney cancer Neg Hx    Bladder Cancer Neg Hx     Social History:  reports that he quit smoking about 55 years ago. His smoking use included cigarettes. He has never used smokeless tobacco. He reports that he does not drink alcohol and does not use drugs.  ROS: Pertinent ROS in HPI  Physical Exam: There were no vitals taken for this visit.  Constitutional:  Well nourished. Alert and oriented, No acute distress. HEENT: [place] AT, moist mucus membranes.  Trachea midline Cardiovascular: No clubbing, cyanosis, or edema. Respiratory: Normal respiratory effort, no increased work of breathing. Neurologic: Grossly intact, no focal deficits, moving all 4 extremities. Psychiatric: Normal mood and affect.   Laboratory Data: See EPIC and HPI  I have reviewed the labs.   Pertinent Imaging: N/A   Assessment & Plan:   1. Urethral strictures - PVR ***  2. BPH with LUTS -PSA stable -continue conservative management, avoiding bladder irritants and timed voiding's -continue tamsulosin  0.4 mg daily and finasteride  5 mg daily  3. High risk hematuria - former smoker (6 months)  - work up completed in 2021 - notable for urethral strictures - non- contrast CT 2022 - right benign renal cyst - cysto 2022 - friable prostatic mucosa with bulbar urethral  stricture - RUS (04/2024) NED - cysto (06/2024) 51f urethral stricture   No follow-ups on file.  These notes generated with voice recognition software. I apologize for typographical errors.  CLOTILDA HELON RIGGERS  [facility] [address]  [address] [place], [place] [address] [phone]   "

## 2024-09-16 ENCOUNTER — Ambulatory Visit: Admitting: Urology

## 2024-09-16 VITALS — BP 126/75 | HR 65 | Wt 204.0 lb

## 2024-09-16 DIAGNOSIS — R319 Hematuria, unspecified: Secondary | ICD-10-CM

## 2024-09-16 DIAGNOSIS — N35919 Unspecified urethral stricture, male, unspecified site: Secondary | ICD-10-CM

## 2024-09-16 DIAGNOSIS — N138 Other obstructive and reflux uropathy: Secondary | ICD-10-CM

## 2024-09-16 DIAGNOSIS — R351 Nocturia: Secondary | ICD-10-CM

## 2024-09-16 LAB — BLADDER SCAN AMB NON-IMAGING

## 2024-09-16 MED ORDER — TAMSULOSIN HCL 0.4 MG PO CAPS: 0.4000 mg | ORAL_CAPSULE | Freq: Every day | ORAL | 3 refills | Status: AC

## 2024-09-16 MED ORDER — FINASTERIDE 5 MG PO TABS: 5.0000 mg | ORAL_TABLET | Freq: Every day | ORAL | 3 refills | Status: AC

## 2025-03-16 ENCOUNTER — Ambulatory Visit: Admitting: Urology

## 2025-03-24 ENCOUNTER — Ambulatory Visit: Admitting: Urology
